# Patient Record
Sex: Female | Born: 1945
Health system: Southern US, Community
[De-identification: ages and names within clinical notes are randomized; demographics above are authoritative.]

## PROBLEM LIST (undated history)

## (undated) DIAGNOSIS — C801 Malignant (primary) neoplasm, unspecified: Secondary | ICD-10-CM

## (undated) DIAGNOSIS — E785 Hyperlipidemia, unspecified: Secondary | ICD-10-CM

## (undated) DIAGNOSIS — I10 Essential (primary) hypertension: Secondary | ICD-10-CM

## (undated) HISTORY — DX: Hyperlipidemia, unspecified: E78.5

## (undated) HISTORY — DX: Essential (primary) hypertension: I10

## (undated) HISTORY — DX: Malignant (primary) neoplasm, unspecified: C80.1

---

## 1979-08-18 DIAGNOSIS — C801 Malignant (primary) neoplasm, unspecified: Secondary | ICD-10-CM

## 1979-08-18 HISTORY — PX: TOTAL ABDOMINAL HYSTERECTOMY: SHX209

## 1979-08-18 HISTORY — DX: Malignant (primary) neoplasm, unspecified: C80.1

## 2002-08-17 HISTORY — PX: LAPAROSCOPIC CHOLECYSTECTOMY: SUR755

## 2010-10-24 ENCOUNTER — Other Ambulatory Visit: Payer: Self-pay | Admitting: Family Medicine

## 2010-10-24 ENCOUNTER — Encounter: Payer: Self-pay | Admitting: Family Medicine

## 2010-10-24 ENCOUNTER — Ambulatory Visit (INDEPENDENT_AMBULATORY_CARE_PROVIDER_SITE_OTHER): Payer: Medicare PPO | Admitting: Family Medicine

## 2010-10-24 DIAGNOSIS — E785 Hyperlipidemia, unspecified: Secondary | ICD-10-CM | POA: Insufficient documentation

## 2010-10-24 DIAGNOSIS — I10 Essential (primary) hypertension: Secondary | ICD-10-CM

## 2010-10-24 DIAGNOSIS — R609 Edema, unspecified: Secondary | ICD-10-CM

## 2010-10-24 DIAGNOSIS — Z780 Asymptomatic menopausal state: Secondary | ICD-10-CM | POA: Insufficient documentation

## 2010-10-24 DIAGNOSIS — E559 Vitamin D deficiency, unspecified: Secondary | ICD-10-CM

## 2010-10-24 LAB — COMPREHENSIVE METABOLIC PANEL
ALT: 19 U/L (ref 0–35)
Alkaline Phosphatase: 63 U/L (ref 39–117)
CO2: 25 mEq/L (ref 19–32)
Creat: 0.53 mg/dL (ref 0.40–1.20)
Total Bilirubin: 0.5 mg/dL (ref 0.3–1.2)

## 2010-10-24 LAB — LIPID PANEL
Cholesterol: 287 mg/dL — ABNORMAL HIGH (ref 0–200)
Total CHOL/HDL Ratio: 5 Ratio
Triglycerides: 82 mg/dL (ref <150)
VLDL: 16 mg/dL (ref 0–40)

## 2010-10-24 LAB — VITAMIN D 25 HYDROXY (VIT D DEFICIENCY, FRACTURES): Vit D, 25-Hydroxy: 28 ng/mL — ABNORMAL LOW (ref 30–89)

## 2010-10-27 LAB — CONVERTED CEMR LAB
Albumin: 4.6 g/dL (ref 3.5–5.2)
CO2: 25 meq/L (ref 19–32)
Cholesterol: 287 mg/dL — ABNORMAL HIGH (ref 0–200)
Glucose, Bld: 89 mg/dL (ref 70–99)
HCT: 41.1 % (ref 36.0–46.0)
MCV: 92.2 fL (ref 78.0–100.0)
Platelets: 249 10*3/uL (ref 150–400)
Potassium: 4.5 meq/L (ref 3.5–5.3)
RDW: 12.7 % (ref 11.5–15.5)
Sodium: 140 meq/L (ref 135–145)
Total Protein: 7.1 g/dL (ref 6.0–8.3)
Triglycerides: 82 mg/dL (ref <150)

## 2010-10-28 NOTE — Assessment & Plan Note (Signed)
Summary: NOV BP/ chol/ labs   Vital Signs:  Patient profile:   65 year old female Height:      64 inches Weight:      154 pounds BMI:     26.53 O2 Sat:      97 % on Room air Temp:     98.1 degrees F oral Pulse rate:   63 / minute BP sitting:   154 / 79  (left arm) Cuff size:   regular  Vitals Entered By: Payton Spark CMA (October 24, 2010 9:26 AM)  O2 Flow:  Room air CC: New to est.    Primary Care Provider:  Seymour Bars DO  CC:  New to est. .  History of Present Illness: 65 yo WF presents for NOV.  She has a hx of HTN and high cholesterol.  Currently on HCTZ alone.  she is due for fasting labs.  She was on Lipitor in the past and did well on it though had some myalgias.  She had a TAH with oophorectomy for cancer in 1980-01-11.  She was on Premarin for about 20 yrs.  She is due for a mammogram, DEXA, pap and colonoscopy.  Denies chest pain, palpitations but has some mild edema.    Current Medications (verified): 1)  Hydrochlorothiazide 12.5 Mg Caps (Hydrochlorothiazide) .... Take 1 Tab By Mouth Once Daily 2)  Vitamin B-12 500 Mcg Tabs (Cyanocobalamin) 3)  Vitamin D3 400 Unit Tabs (Cholecalciferol) 4)  Fish Oil 1000 Mg Caps (Omega-3 Fatty Acids) 5)  Calcium 500 Mg Tabs (Calcium)  Allergies (verified): 1)  ! Codeine  Past History:  Past Medical History: HTN high cholesterol G2P2 hx of ? endometrial or cervical cancer 1981  Past Surgical History: TAH with BSO 1981 for cancerous reasons lap chole 01/11/03  Family History: mother died CHF  father died lung cancer sister died liver cancer 1 brother died - drowning 1 brother died of lung cancer sister alive, high cholesterol  Social History: Works part time at [employer].  Finished HS. Married to [place]. Has a son and daughter, local. Never smoked. Denies ETOH. Walks 45 min every other day.  Review of Systems       no fevers/sweats/weakness, unexplained wt loss/gain, no change in vision, no difficulty  hearing, ringing in ears, no hay fever/allergies, no CP/discomfort, no palpitations, no breast lump/nipple discharge, no cough/wheeze, no blood in stool, no N/V/D, + nocturia, no leaking urine, no unusual vag bleeding, no vaginal/penile discharge, no muscle/joint pain, no rash, no new/changing mole, no HA, no memory loss, no anxiety, no sleep problem, no depression, no unexplained lumps, no easy bruising/bleeding, no concern with sexual function   Physical Exam  General:  alert, well-developed, well-nourished, and well-hydrated.   Head:  normocephalic and atraumatic.   Eyes:  wears glasses Mouth:  pharynx pink and moist.   Neck:  no masses.   Lungs:  Normal respiratory effort, chest expands symmetrically. Lungs are clear to auscultation, no crackles or wheezes. Heart:  RRR with 1/6 systolic murmur.  Pulses:  2+ radial pulses Extremities:  trace LE edema Skin:  color normal.   Cervical Nodes:  No lymphadenopathy noted Psych:  good eye contact, not anxious appearing, and not depressed appearing.     Impression & Recommendations:  Problem # 1:  ESSENTIAL HYPERTENSION, BENIGN (ICD-401.1) BP high today, not at goal on HCTZ alone.  Will add Benazepril once daily and recheck BP/ BMP in 4 wks during visit for PAP smear.  Call if any problems. Her updated medication list for this problem includes:    Hydrochlorothiazide 12.5 Mg Caps (Hydrochlorothiazide) .Marland Kitchen... Take 1 tab by mouth once daily    Benazepril Hcl 20 Mg Tabs (Benazepril hcl) .Marland Kitchen... 1 tab by mouth once daily  Orders: T-CBC w/Diff ([id number]) T-Comprehensive Metabolic Panel ([id number])  BP today: 154/79  Problem # 2:  HYPERLIPIDEMIA (ICD-272.4) Off meds.  Had some myalgias on Lipitor.  Will update labs today. Orders: T-Lipid Profile ([id number])  Problem # 3:  EDEMA (ICD-782.3) Continue HCTZ.  Check CMP and TSH with labs and f/u results Monday. Plan to get a 2D echo this year. Her updated medication list for this problem  includes:    Hydrochlorothiazide 12.5 Mg Caps (Hydrochlorothiazide) .Marland Kitchen... Take 1 tab by mouth once daily  Orders: T-TSH ([id number])  Problem # 4:  UNSPECIFIED VITAMIN D DEFICIENCY (ICD-268.9) Update DEXA scan and check Vit D level with labs.  On OTC Vit D currently.   Orders: T-Vitamin D (25-Hydroxy) [phone])  Complete Medication List: 1)  Hydrochlorothiazide 12.5 Mg Caps (Hydrochlorothiazide) .... Take 1 tab by mouth once daily 2)  Vitamin B-12 500 Mcg Tabs (Cyanocobalamin) 3)  Vitamin D3 400 Unit Tabs (Cholecalciferol) 4)  Fish Oil 1000 Mg Caps (Omega-3 fatty acids) 5)  Calcium 500 Mg Tabs (Calcium) 6)  Benazepril Hcl 20 Mg Tabs (Benazepril hcl) .Marland Kitchen.. 1 tab by mouth once daily  Other Orders: T-Mammography Bilateral Screening (02725) T-DXA Bone Density/ Appendicular (36644) T-Dual DXA Bone Density/ Axial (03474)  Patient Instructions: 1)  BP HIGH -- add Benazepril 20 mg once daily to HCTZ for high BP. 2)  Check fasting labs downstairs today and will call you w/ results tomorrow. 3)  Update mammogram and DEXA downstairs.  Call for appt. 4)  Call if any problems. 5)  Set up PAP smear/ repeat BMP/ BP in 4 wks. Prescriptions: BENAZEPRIL HCL 20 MG TABS (BENAZEPRIL HCL) 1 tab by mouth once daily  #30 x 1   Entered and Authorized by:   Seymour Bars DO   Signed by:   Seymour Bars DO on 10/24/2010   Method used:   Electronically to        [facility] [phone]* (retail)       [address] [place], [place]  [address]       Ph: [phone]       Fax: [phone]   RxID:   [id number] HYDROCHLOROTHIAZIDE 12.5 MG CAPS (HYDROCHLOROTHIAZIDE) Take 1 tab by mouth once daily  #30 x 5   Entered and Authorized by:   Seymour Bars DO   Signed by:   Seymour Bars DO on 10/24/2010   Method used:   Electronically to        [facility] [phone]* (retail)       [address] [place], [place]  [address]       Ph: [phone]       Fax: [phone]   RxID:    [id number]    Orders Added: 1)  T-Mammography Bilateral Screening [77057] 2)  T-DXA Bone Density/ Appendicular [77081] 3)  T-Dual DXA Bone Density/ Axial [77080] 4)  T-CBC w/Diff [[id number]] 5)  T-Comprehensive Metabolic Panel [[id number]] 6)  T-Lipid Profile [80061-22930] 7)  T-Vitamin D (25-Hydroxy) [78938-10175] 8)  T-TSH [10258-52778] 9)  New Patient Level III [24235]

## 2010-11-04 NOTE — Letter (Signed)
Summary: Intake Forms  Intake Forms   Imported By: Lanelle Bal 10/30/2010 10:51:32  _____________________________________________________________________  External Attachment:    Type:   Image     Comment:   External Document

## 2010-11-17 ENCOUNTER — Encounter: Payer: Self-pay | Admitting: Family Medicine

## 2010-11-17 ENCOUNTER — Other Ambulatory Visit: Payer: Self-pay | Admitting: Family Medicine

## 2010-11-17 DIAGNOSIS — Z1231 Encounter for screening mammogram for malignant neoplasm of breast: Secondary | ICD-10-CM

## 2010-11-17 DIAGNOSIS — Z780 Asymptomatic menopausal state: Secondary | ICD-10-CM

## 2010-11-19 ENCOUNTER — Other Ambulatory Visit: Payer: Self-pay | Admitting: Family Medicine

## 2010-11-19 DIAGNOSIS — Z780 Asymptomatic menopausal state: Secondary | ICD-10-CM

## 2010-11-20 ENCOUNTER — Ambulatory Visit: Payer: Medicare PPO | Admitting: Family Medicine

## 2010-11-28 ENCOUNTER — Encounter: Payer: Self-pay | Admitting: Family Medicine

## 2010-12-04 ENCOUNTER — Other Ambulatory Visit (HOSPITAL_COMMUNITY)
Admission: RE | Admit: 2010-12-04 | Discharge: 2010-12-04 | Disposition: A | Discharge status: Home / Self Care | Payer: Medicare PPO | Source: Ambulatory Visit | Attending: Family Medicine | Admitting: Family Medicine

## 2010-12-04 ENCOUNTER — Ambulatory Visit (INDEPENDENT_AMBULATORY_CARE_PROVIDER_SITE_OTHER): Payer: Medicare PPO | Admitting: Family Medicine

## 2010-12-04 ENCOUNTER — Encounter: Payer: Self-pay | Admitting: Family Medicine

## 2010-12-04 VITALS — BP 161/85 | HR 73 | Ht 64.0 in | Wt 159.0 lb

## 2010-12-04 DIAGNOSIS — Z01419 Encounter for gynecological examination (general) (routine) without abnormal findings: Secondary | ICD-10-CM | POA: Insufficient documentation

## 2010-12-04 DIAGNOSIS — E559 Vitamin D deficiency, unspecified: Secondary | ICD-10-CM

## 2010-12-04 DIAGNOSIS — E785 Hyperlipidemia, unspecified: Secondary | ICD-10-CM

## 2010-12-04 DIAGNOSIS — I10 Essential (primary) hypertension: Secondary | ICD-10-CM

## 2010-12-04 NOTE — Assessment & Plan Note (Signed)
Doing great on crestor 20  Mg qhs.  Continue and recheck labs in 3 mos.

## 2010-12-04 NOTE — Assessment & Plan Note (Signed)
Continue RX wkly vitamin D in addition to OTC Vit D 1,000 IU/ day.  Recheck level in 3 mos.

## 2010-12-04 NOTE — Assessment & Plan Note (Signed)
Thin prep pap of the vaginal cuff done given her hx of gyn cancer as cause for hysterectomy.  F/u results next wk.

## 2010-12-04 NOTE — Progress Notes (Signed)
  Subjective:    Patient ID: Michelle Lawrence, female    DOB: [birth year], 65 y.o.   MRN: [id number]  HPI  65 yo WF presents for thin prep smear.  She has not had a pap since 2002 and her hysterectomy was done for cancerous reasons.  Her ovaries were also removed.  She denies vag bleeding or discharge but has some itching and irritation.  She admits to not taking her benazepril for her BP but is taking her HCTZ daily and has added Crestor 20 mg qhs, started a month ago for an LDL of 214.  She is also on RX Vit D wkly.    Her dexa/ mammo is scheduled for next wk.   BP 161/85  Pulse 73  Ht 5\' 4"  (1.626 m)  Wt 159 lb (72.122 kg)  BMI 27.29 kg/m2  SpO2 96%    Review of Systems  Constitutional: Negative for fever and fatigue.  Respiratory: Negative for shortness of breath.   Cardiovascular: Negative for chest pain.  Genitourinary: Negative for dysuria, urgency, frequency, vaginal bleeding, vaginal discharge, genital sores, vaginal pain, pelvic pain and dyspareunia.  Psychiatric/Behavioral: Negative for dysphoric mood. The patient is not nervous/anxious.        Objective:   Physical Exam  Constitutional: She appears well-developed and well-nourished. No distress.  HENT:  Head: Normocephalic and atraumatic.  Neck: No thyromegaly present.  Cardiovascular: Normal rate and normal heart sounds.   No murmur heard. Pulmonary/Chest: Effort normal and breath sounds normal.  Abdominal: Soft. Bowel sounds are normal. She exhibits no distension. There is no tenderness.  Genitourinary: There is rash on the right labia. There is no tenderness or lesion on the right labia. There is rash on the left labia. There is no tenderness or lesion on the left labia. Cervix exhibits no motion tenderness (vaginal cuff swabbed, appears normal) and no discharge. There is erythema (mild labial erythema) around the vagina.  Musculoskeletal: She exhibits no edema.  Lymphadenopathy:    She has no cervical adenopathy.    Skin: Skin is warm and dry.  Psychiatric: She has a normal mood and affect.          Assessment & Plan:

## 2010-12-04 NOTE — Assessment & Plan Note (Signed)
BP remains high b/c she is still not taking the benazepril.  She is to take it everyday in addition to her other meds and understands this.  rehceck in 2-3 mos.

## 2010-12-04 NOTE — Patient Instructions (Signed)
Will call you with pap smear report next wk.  Start Benazepril in the evenings and stay on HCTZ in the mornign.  Stay on Crestor at night.  Stay on 2 calcium OTCs + 2 vitamin D tabs daily and stay on RX vitamin D weekly.  Return for f/u visit in 3 mos.

## 2010-12-09 ENCOUNTER — Inpatient Hospital Stay: Admission: RE | Admit: 2010-12-09 | Payer: Medicare PPO | Source: Ambulatory Visit

## 2010-12-09 ENCOUNTER — Telehealth: Payer: Self-pay | Admitting: Family Medicine

## 2010-12-09 ENCOUNTER — Ambulatory Visit: Payer: Medicare PPO

## 2010-12-09 NOTE — Telephone Encounter (Signed)
Pls let pt know that her pap smear came back normal. 

## 2010-12-10 NOTE — Telephone Encounter (Signed)
Pt aware of the above  

## 2010-12-16 ENCOUNTER — Ambulatory Visit
Admission: RE | Admit: 2010-12-16 | Discharge: 2010-12-16 | Disposition: A | Discharge status: Home / Self Care | Payer: Medicare PPO | Source: Ambulatory Visit | Attending: Family Medicine | Admitting: Family Medicine

## 2010-12-16 ENCOUNTER — Other Ambulatory Visit: Payer: Medicare PPO

## 2010-12-16 DIAGNOSIS — Z1231 Encounter for screening mammogram for malignant neoplasm of breast: Secondary | ICD-10-CM

## 2010-12-16 DIAGNOSIS — Z780 Asymptomatic menopausal state: Secondary | ICD-10-CM

## 2010-12-17 ENCOUNTER — Telehealth: Payer: Self-pay | Admitting: Family Medicine

## 2010-12-17 DIAGNOSIS — M8580 Other specified disorders of bone density and structure, unspecified site: Secondary | ICD-10-CM | POA: Insufficient documentation

## 2010-12-17 NOTE — Telephone Encounter (Signed)
Pls let pt know that her bone density shows osteopenia.  (between nomral and osteoporosis).  Continue calcium + vitamin D daily and repeat in 2 yrs.

## 2010-12-17 NOTE — Telephone Encounter (Signed)
Pt aware of the above  

## 2011-01-19 ENCOUNTER — Other Ambulatory Visit: Payer: Self-pay | Admitting: *Deleted

## 2011-01-19 MED ORDER — ERGOCALCIFEROL 1.25 MG (50000 UT) PO CAPS: 50000.0000 [IU] | ORAL_CAPSULE | ORAL | Status: DC

## 2011-02-03 ENCOUNTER — Other Ambulatory Visit: Payer: Self-pay | Admitting: *Deleted

## 2011-02-03 MED ORDER — BENAZEPRIL HCL 20 MG PO TABS: 20.0000 mg | ORAL_TABLET | Freq: Every day | ORAL | Status: DC

## 2011-02-03 MED ORDER — ROSUVASTATIN CALCIUM 20 MG PO TABS: 20.0000 mg | ORAL_TABLET | Freq: Every day | ORAL | Status: DC

## 2011-03-05 ENCOUNTER — Encounter: Payer: Self-pay | Admitting: Family Medicine

## 2011-03-05 ENCOUNTER — Ambulatory Visit (INDEPENDENT_AMBULATORY_CARE_PROVIDER_SITE_OTHER): Payer: Medicare PPO | Admitting: Family Medicine

## 2011-03-05 DIAGNOSIS — E785 Hyperlipidemia, unspecified: Secondary | ICD-10-CM

## 2011-03-05 DIAGNOSIS — E559 Vitamin D deficiency, unspecified: Secondary | ICD-10-CM

## 2011-03-05 DIAGNOSIS — I10 Essential (primary) hypertension: Secondary | ICD-10-CM

## 2011-03-05 NOTE — Patient Instructions (Signed)
BP much improved!  Stay on current meds. Will call you w/ lab results tomorrow (will let you know if we need to continue RX Vitamin D).  Try to get 2-3 servings of calcium in diet daily (milk, calcium fortified OJ, almonds, broccoli, yogurt) and take 1 calcium supplement daily with dinner.  Return for f/u in 4 mos.

## 2011-03-05 NOTE — Assessment & Plan Note (Signed)
Due to recheck FLP, on Crestor 20 mg qhs, tolerating it well.

## 2011-03-05 NOTE — Assessment & Plan Note (Signed)
BP much improved by adding Benazepril.  Continue both and update a BMP today.  Continue to work on diet/ exercise.

## 2011-03-05 NOTE — Assessment & Plan Note (Signed)
Complicated by low Vit D.  Recheck level today.  Advised 2-3 servings of calcium in diet daily + 1 supplement daily with dinner.  Should stay on OTC Vit D 1,000 IU/ day.

## 2011-03-05 NOTE — Progress Notes (Signed)
  Subjective:    Patient ID: Michelle Lawrence, female    DOB: Jun 09, 1946, 65 y.o.   MRN: [id number]  HPI  65 yo WF presents for f/u visit.  She is back for f/u HTN, doing well on meds.  Recently had her DEXA that was + for osteopenia.  She started a calcium supplement 1-2 x a day and has just finished RX Vit D wkly and is due to have level rechecked.  She has very high cholesterol, on Crestor.  Denies chest pain or DOE.    BP 137/78  Pulse 61  Ht 5\' 4"  (1.626 m)  Wt 157 lb (71.215 kg)  BMI 26.95 kg/m2    Review of Systems  Constitutional: Negative for fatigue and unexpected weight change.  Eyes: Negative for visual disturbance.  Respiratory: Negative for shortness of breath.   Cardiovascular: Negative for chest pain, palpitations and leg swelling.  Genitourinary: Negative for difficulty urinating.  Neurological: Negative for headaches.       Objective:   Physical Exam  Constitutional: She appears well-developed and well-nourished. No distress.  HENT:  Mouth/Throat: Oropharynx is clear and moist.  Eyes: Pupils are equal, round, and reactive to light.  Neck: Neck supple. No thyromegaly present.  Cardiovascular: Normal rate, regular rhythm and normal heart sounds.   No murmur heard. Pulmonary/Chest: Effort normal and breath sounds normal.  Musculoskeletal: She exhibits no edema.  Lymphadenopathy:    She has no cervical adenopathy.  Skin:       Several SKs along the L abdomen.  Psychiatric: She has a normal mood and affect.          Assessment & Plan:

## 2011-03-06 ENCOUNTER — Telehealth: Payer: Self-pay | Admitting: Family Medicine

## 2011-03-06 LAB — BASIC METABOLIC PANEL WITH GFR
BUN: 18 mg/dL (ref 6–23)
CO2: 25 mEq/L (ref 19–32)
Chloride: 103 mEq/L (ref 96–112)
Creat: 0.48 mg/dL — ABNORMAL LOW (ref 0.50–1.10)
Glucose, Bld: 80 mg/dL (ref 70–99)

## 2011-03-06 LAB — LIPID PANEL
Total CHOL/HDL Ratio: 2.6 Ratio
VLDL: 15 mg/dL (ref 0–40)

## 2011-03-06 LAB — VITAMIN D 25 HYDROXY (VIT D DEFICIENCY, FRACTURES): Vit D, 25-Hydroxy: 50 ng/mL (ref 30–89)

## 2011-03-06 NOTE — Telephone Encounter (Signed)
Pls let pt know that her fasting sugar, liver and kidney function came back normal.  Her cholesterol is MUCH improved on meds -- continue.  Her Vitamin D is back to normal.  Stay on OTC Vitamin D 1,000 IU/ day for maintenance.

## 2011-03-06 NOTE — Telephone Encounter (Signed)
Pt aware of the above  

## 2011-03-12 ENCOUNTER — Telehealth: Payer: Self-pay | Admitting: Family Medicine

## 2011-03-12 NOTE — Telephone Encounter (Signed)
Pt called and said she bought Vit D 3 and was told by the provider to take Vit D 1000 IU daily.  Pt wants to know if Vit D 3 and Vit D are the same thing and if she can take what she has already purchased. Plan:  Pt instructed that Vit D 3 and Vit D are essentially the same thing and it is okay to take either or. Jarvis Newcomer, LPN [facility]

## 2011-04-28 ENCOUNTER — Telehealth: Payer: Self-pay | Admitting: Family Medicine

## 2011-04-28 NOTE — Telephone Encounter (Signed)
Pt called and said she has an appt scheduled with the provider for later in November and would it be okay to wait for her flu shot til then. Plan:  Pt informed okay to wait, but could choose to call and make a nurse visit to get flu shot sooner if so desires. Jarvis Newcomer, LPN [facility]

## 2011-05-08 ENCOUNTER — Ambulatory Visit (INDEPENDENT_AMBULATORY_CARE_PROVIDER_SITE_OTHER): Payer: Medicare PPO | Admitting: Family Medicine

## 2011-05-08 DIAGNOSIS — Z23 Encounter for immunization: Secondary | ICD-10-CM

## 2011-05-08 NOTE — Progress Notes (Signed)
Here for flu vaccine

## 2011-05-22 ENCOUNTER — Other Ambulatory Visit: Payer: Self-pay | Admitting: Family Medicine

## 2011-05-22 MED ORDER — HYDROCHLOROTHIAZIDE 12.5 MG PO CAPS: 12.5000 mg | ORAL_CAPSULE | Freq: Every day | ORAL | Status: DC

## 2011-05-22 NOTE — Telephone Encounter (Signed)
Pt called for refill of her HCTZ 12.5 mg. Plan:  Refilled med for #30/4 refills.  Pt informed and told to call the pharm in the future for routine refills. Jarvis Newcomer, LPN [facility]

## 2011-06-04 ENCOUNTER — Other Ambulatory Visit: Payer: Self-pay | Admitting: *Deleted

## 2011-06-04 MED ORDER — BENAZEPRIL HCL 20 MG PO TABS: 20.0000 mg | ORAL_TABLET | Freq: Every day | ORAL | Status: DC

## 2011-07-13 ENCOUNTER — Encounter: Payer: Self-pay | Admitting: Family Medicine

## 2011-07-13 ENCOUNTER — Ambulatory Visit (INDEPENDENT_AMBULATORY_CARE_PROVIDER_SITE_OTHER): Payer: Medicare PPO | Admitting: Family Medicine

## 2011-07-13 DIAGNOSIS — I10 Essential (primary) hypertension: Secondary | ICD-10-CM

## 2011-07-13 DIAGNOSIS — E785 Hyperlipidemia, unspecified: Secondary | ICD-10-CM

## 2011-07-13 MED ORDER — BENAZEPRIL-HYDROCHLOROTHIAZIDE 20-25 MG PO TABS: 1.0000 | ORAL_TABLET | Freq: Every day | ORAL | Status: DC

## 2011-07-13 MED ORDER — AMBULATORY NON FORMULARY MEDICATION: Status: DC

## 2011-07-13 MED ORDER — ROSUVASTATIN CALCIUM 20 MG PO TABS: 20.0000 mg | ORAL_TABLET | Freq: Every day | ORAL | Status: DC

## 2011-07-13 NOTE — Progress Notes (Signed)
  Subjective:    Patient ID: Michelle Lawrence, female    DOB: Mar 13, 1946, 65 y.o.   MRN: [id number]  Hypertension This is a chronic problem. The current episode started more than 1 year ago. The problem has been gradually improving since onset. Pertinent negatives include no chest pain or shortness of breath. Agents associated with hypertension include NSAIDs. Past treatments include diuretics and ACE inhibitors. The current treatment provides moderate improvement. There are no compliance problems.   Hyperlipidemia This is a chronic problem. The problem is controlled. Factors aggravating her hyperlipidemia include no known factors. Pertinent negatives include no chest pain or shortness of breath. Current antihyperlipidemic treatment includes statins. The current treatment provides mild improvement of lipids. There are no compliance problems.       Review of Systems  Respiratory: Negative for shortness of breath.   Cardiovascular: Negative for chest pain.       Objective:   Physical Exam  Constitutional: She is oriented to person, place, and time. She appears well-developed and well-nourished.  HENT:  Head: Normocephalic and atraumatic.  Cardiovascular: Normal rate, regular rhythm and normal heart sounds.   Pulmonary/Chest: Effort normal and breath sounds normal.  Neurological: She is alert and oriented to person, place, and time.  Skin: Skin is warm and dry.  Psychiatric: She has a normal mood and affect. Her behavior is normal.          Assessment & Plan:  HTN- well controlled. Will combine her medication. She is actually taking benaz/HCT 20/12.5 and a separate hct 12.5 .  F.U in 4 months. Check CMP to check Cr and BUN and K+.    Hyperlipidemia - Restart chol med. Has been off for 1 week and recheck later this month. Make sure eating healthy and getting regular exercise.   Dec pneumo vac today. Wants to wait until next year. Given rx for shingles vac.

## 2011-07-13 NOTE — Patient Instructions (Addendum)
Caltrate - D is one tablet twice a day. Then you can take one extra Vitamin D 400IU for your daily needs.  Once you finish your current BP meds can pick up new prescription for the benazepril HCT 20/25mg  once a day.

## 2011-07-15 ENCOUNTER — Telehealth: Payer: Self-pay | Admitting: *Deleted

## 2011-07-15 NOTE — Telephone Encounter (Signed)
Pt notified and will check her schedule and let us know when she can have this done

## 2011-07-15 NOTE — Telephone Encounter (Signed)
Dr. Linford Arnold- this pt had a mammogram on 12/16/2010 and was normal. I do not see a reocrd of old colonoscopy report in old or new system or any record of Tdap

## 2011-07-15 NOTE — Telephone Encounter (Signed)
Okay, please call the patient and let her know that we do not have a record of her colonoscopy or her tetanus vaccine. If she would like [place] to schedule these for her we certainly can. If she she's had been them done somewhere else please let us know and we can  get those records.

## 2011-07-27 LAB — LIPID PANEL
LDL Cholesterol: 68 mg/dL (ref 0–99)
Total CHOL/HDL Ratio: 3 Ratio
VLDL: 21 mg/dL (ref 0–40)

## 2011-07-28 LAB — COMPLETE METABOLIC PANEL WITH GFR
ALT: 21 U/L (ref 0–35)
AST: 18 U/L (ref 0–37)
Albumin: 4.1 g/dL (ref 3.5–5.2)
Alkaline Phosphatase: 67 U/L (ref 39–117)
Potassium: 4.8 mEq/L (ref 3.5–5.3)
Sodium: 142 mEq/L (ref 135–145)
Total Protein: 6.6 g/dL (ref 6.0–8.3)

## 2011-07-31 ENCOUNTER — Other Ambulatory Visit: Payer: Self-pay | Admitting: *Deleted

## 2011-07-31 MED ORDER — ROSUVASTATIN CALCIUM 20 MG PO TABS: 20.0000 mg | ORAL_TABLET | Freq: Every day | ORAL | Status: DC

## 2011-07-31 MED ORDER — BENAZEPRIL-HYDROCHLOROTHIAZIDE 20-25 MG PO TABS: 1.0000 | ORAL_TABLET | Freq: Every day | ORAL | Status: DC

## 2011-11-05 ENCOUNTER — Encounter: Payer: Self-pay | Admitting: *Deleted

## 2011-11-12 ENCOUNTER — Ambulatory Visit: Payer: Medicare PPO | Admitting: Family Medicine

## 2011-11-26 ENCOUNTER — Ambulatory Visit: Payer: Medicare PPO | Admitting: Family Medicine

## 2011-12-03 ENCOUNTER — Ambulatory Visit: Payer: Medicare PPO | Admitting: Family Medicine

## 2011-12-10 ENCOUNTER — Ambulatory Visit (INDEPENDENT_AMBULATORY_CARE_PROVIDER_SITE_OTHER): Payer: Medicare PPO | Admitting: Family Medicine

## 2011-12-10 ENCOUNTER — Encounter: Payer: Self-pay | Admitting: Family Medicine

## 2011-12-10 VITALS — BP 125/64 | HR 60 | Ht 64.0 in | Wt 156.0 lb

## 2011-12-10 DIAGNOSIS — L600 Ingrowing nail: Secondary | ICD-10-CM

## 2011-12-10 MED ORDER — HYDROCODONE-ACETAMINOPHEN 5-325 MG PO TABS: 1.0000 | ORAL_TABLET | Freq: Four times a day (QID) | ORAL | Status: AC | PRN

## 2011-12-10 NOTE — Patient Instructions (Signed)
Keep foot elevated today. He can remove the bandage tomorrow afternoon or evening. Can take a shower with regular soap and water. Do not apply any type of peroxide or peroxide-type product. Apply Vaseline and cover for one to 2 weeks. If you have any sign of infection and please call us back.

## 2011-12-10 NOTE — Progress Notes (Signed)
  Subjective:    Patient ID: Michelle Lawrence, female    DOB: 1945-12-18, 66 y.o.   MRN: [id number]  HPI She is here today for ingrown toenail on her left great toe. She says it's been swollen and tender flat weeks. She denies any pus or drainage from the toe. No systemic fever or aches. She says is getting so sore that she can't wear her regular shoes now. She has never had to have a toenail removed for ingrown toenail but has had it done for a nail fungus. She says she does get lightheaded sometimes and has passed out before procedures.   Review of Systems     Objective:   Physical Exam  Her left great toe along the medial edge curls in. There is a large pustule along the edge of the nail. Erythema over half of the distal toe.      Assessment & Plan:  Ingrown toenail-discussed the need for partial nail removal. Please see procedure note below. Patient tolerated well. She is to elevate her foot today and keep it wrapped for 24 hours. After that she can get it wet in the shower with soap and water. No peroxide. She'll apply Vaseline and recover the toe for one to 2 weeks. Make sure wearing shoes that do not put extra pressure on the toe while at healing. If she has any signs of infection or spreading erythema or pain she can call the office. I did give her a small quantity of hydrocodone to use for pain over the next 24 hours. After that she can change to ibuprofen or Aleve.  Toenail Avulsion Procedure Note  Pre-operative Diagnosis: Left Ingrown Great toenail   Post-operative Diagnosis: Left Ingrown Great toenail  Indications: Pain and infection  Anesthesia: Lidocaine 1% without epinephrine without added sodium bicarbonate  Procedure Details  History of allergy to iodine: no  The risks (including bleeding and infection) and benefits of the  procedure and Verbal informed consent obtained.  After digital block anesthesia was obtained, a tourniquet was applied for hemostasis during the  procedure.  After prepping with Betadine, the offending edge of the nail was freed from the nailbed and perionychium, and then split with scissors and removed with  forceps.  All visible granulation tissue is debrided. Antibiotic and bulky dressing was applied. Yellow pus expressed.   Findings: Infected ingrown toenail.  Complications: none.  Plan: 1. Soak the foot twice daily. Change dressing twice daily until healed over. 2. Warning signs of infection were reviewed.   3. Recommended that the patient use Vicodin as needed for pain.  4. Return as needed.

## 2011-12-21 ENCOUNTER — Ambulatory Visit: Payer: Medicare PPO | Admitting: Family Medicine

## 2011-12-31 ENCOUNTER — Ambulatory Visit: Payer: Medicare PPO | Admitting: Family Medicine

## 2012-02-04 ENCOUNTER — Ambulatory Visit (INDEPENDENT_AMBULATORY_CARE_PROVIDER_SITE_OTHER): Payer: Medicare PPO | Admitting: Family Medicine

## 2012-02-04 ENCOUNTER — Encounter: Payer: Self-pay | Admitting: Family Medicine

## 2012-02-04 VITALS — BP 134/76 | HR 69 | Ht 64.0 in | Wt 157.0 lb

## 2012-02-04 DIAGNOSIS — N760 Acute vaginitis: Secondary | ICD-10-CM

## 2012-02-04 DIAGNOSIS — R0789 Other chest pain: Secondary | ICD-10-CM

## 2012-02-04 DIAGNOSIS — E785 Hyperlipidemia, unspecified: Secondary | ICD-10-CM

## 2012-02-04 DIAGNOSIS — Z9181 History of falling: Secondary | ICD-10-CM

## 2012-02-04 DIAGNOSIS — Z23 Encounter for immunization: Secondary | ICD-10-CM

## 2012-02-04 DIAGNOSIS — Z1331 Encounter for screening for depression: Secondary | ICD-10-CM

## 2012-02-04 DIAGNOSIS — L259 Unspecified contact dermatitis, unspecified cause: Secondary | ICD-10-CM

## 2012-02-04 DIAGNOSIS — I10 Essential (primary) hypertension: Secondary | ICD-10-CM

## 2012-02-04 LAB — COMPLETE METABOLIC PANEL WITH GFR
CO2: 28 mEq/L (ref 19–32)
Calcium: 10 mg/dL (ref 8.4–10.5)
Creat: 0.54 mg/dL (ref 0.50–1.10)
GFR, Est African American: >89 mL/min
GFR, Est Non African American: >89 mL/min
Glucose, Bld: 86 mg/dL (ref 70–99)
Total Bilirubin: 0.4 mg/dL (ref 0.3–1.2)

## 2012-02-04 LAB — CBC WITH DIFFERENTIAL/PLATELET
Basophils Relative: 1 % (ref 0–1)
Hemoglobin: 13.3 g/dL (ref 12.0–15.0)
Lymphs Abs: 1.5 10*3/uL (ref 0.7–4.0)
MCHC: 34 g/dL (ref 30.0–36.0)
Monocytes Relative: 11 % (ref 3–12)
Neutro Abs: 2.4 10*3/uL (ref 1.7–7.7)
Neutrophils Relative %: 52 % (ref 43–77)
Platelets: 268 10*3/uL (ref 150–400)
RBC: 4.35 MIL/uL (ref 3.87–5.11)

## 2012-02-04 LAB — TSH: TSH: 1.414 u[IU]/mL (ref 0.350–4.500)

## 2012-02-04 MED ORDER — AMBULATORY NON FORMULARY MEDICATION: Status: DC

## 2012-02-04 MED ORDER — TRIAMCINOLONE ACETONIDE 0.5 % EX OINT: TOPICAL_OINTMENT | Freq: Every day | CUTANEOUS | Status: DC | PRN

## 2012-02-04 NOTE — Addendum Note (Signed)
Addended by: Wyline Beady on: 02/04/2012 03:05 PM   Modules accepted: Orders

## 2012-02-04 NOTE — Progress Notes (Addendum)
  Subjective:    Patient ID: Michelle Lawrence, female    DOB: Jan 14, 1946, 66 y.o.   MRN: [id number]  HPI HTN - no CP or SOB.  Taking BP medication.  No SE.  ONe night had terrrible chest pain after eating a tomato sandwhcih and canteoopule  When went to bed. She had to sit up for awhile till felt better. Took a TUMS and helped some but not a great deal. No more chest pain since then. Does walk at the park of exercise.  Pain was midsternal. No radiation of pain. No diaphoresis. Some mild nausea with it. No vomiting.  Hyperlipidemia - Noticed legs aching at night, but not every night.  She also works partime and has been walking more.  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   Contact dermatitis - wears gloves at work that are latext free but says will sweat in her gloves and will sometimes get itchy bumps.    She's also had some vaginal itching for the last couple days and wants to she can be tested for yeast. No discharge or pelvic pain. No fever.  Review of Systems     Objective:   Physical Exam  Constitutional: She is oriented to person, place, and time. She appears well-developed and well-nourished.  HENT:  Head: Normocephalic and atraumatic.  Cardiovascular: Normal rate, regular rhythm and normal heart sounds.        No carotid or abdominal bruits.  Pulmonary/Chest: Effort normal and breath sounds normal.  Musculoskeletal: She exhibits no edema.  Neurological: She is alert and oriented to person, place, and time.  Skin: Skin is warm and dry.  Psychiatric: She has a normal mood and affect. Her behavior is normal.          Assessment & Plan:  HTN - Well controlled. Check CMP today. F/U in 6 months  Atypical chest pain-based on her description suspect this was an episode of GERD but I did get an EKG today and she does have high cholesterol and high blood pressure. No prior history of her disease. He EKG shows rate of 56 beats per minute, bradycardia, sinus  rhythm. Normal axis. No acute changes or ST-T wave changes. No Q waves.  Hyperlipidemia - lipids were well controlled in November. Recheck liver today. Recheck lipids in November.  She is currrently taking half a tab of the Crestor. I encouraged her that may not be quite as effective at half the dose but will have some benefit as far as stroke and heart risk reduction. Samples given today. Followup in 6 months.  Contact dermatitis - Try to keep the hands try and change gloves frequently. I will send over a  a topical steroid cream to use. Don't use for more than 2 weeks.   Vaginitis-wet prep sent. We will call with results.  Depression Screen -  PHQ-9 score of 3, neg for depession.   Fall Assessment - Score of 3, low risk.

## 2012-02-10 ENCOUNTER — Encounter: Payer: Self-pay | Admitting: *Deleted

## 2012-02-12 ENCOUNTER — Telehealth: Payer: Self-pay | Admitting: *Deleted

## 2012-02-12 MED ORDER — PRAVASTATIN SODIUM 40 MG PO TABS: 40.0000 mg | ORAL_TABLET | Freq: Every day | ORAL | Status: DC

## 2012-02-12 NOTE — Telephone Encounter (Signed)
Pt aware.

## 2012-02-12 NOTE — Telephone Encounter (Signed)
Try pravastatin daily. Call with any side effects will check cholesterol in 3 months.

## 2012-02-12 NOTE — Telephone Encounter (Signed)
Pt states that she will not be taking the Crestor 20mg  b/c it makes her legs achy. She states she tried the Lipitor before but it didn't work. Would like something else to try that is also cheaper. Please advise.

## 2012-02-17 ENCOUNTER — Encounter: Payer: Self-pay | Admitting: *Deleted

## 2012-02-17 ENCOUNTER — Emergency Department
Admission: EM | Admit: 2012-02-17 | Discharge: 2012-02-17 | Disposition: A | Discharge status: Home / Self Care | Payer: Medicare PPO | Source: Home / Self Care | Attending: Emergency Medicine | Admitting: Emergency Medicine

## 2012-02-17 DIAGNOSIS — J069 Acute upper respiratory infection, unspecified: Secondary | ICD-10-CM

## 2012-02-17 DIAGNOSIS — R21 Rash and other nonspecific skin eruption: Secondary | ICD-10-CM

## 2012-02-17 MED ORDER — PREDNISONE 10 MG PO TABS: 10.0000 mg | ORAL_TABLET | Freq: Two times a day (BID) | ORAL | Status: DC

## 2012-02-17 NOTE — ED Notes (Signed)
Patient c/o runny nose and sneezing x yesterday. Last night she developed a rash on her face. She started a new cholesterol medication 1 week ago. She used a new face cream for 2 days, 3 days ago but has not used it since.

## 2012-02-17 NOTE — ED Provider Notes (Signed)
History     CSN: [id number]  Arrival date & time 02/17/12  0827   First MD Initiated Contact with Patient 02/17/12 0830      Chief Complaint  Patient presents with  . Rash  . Nasal Congestion    (Consider location/radiation/quality/duration/timing/severity/associated sxs/prior treatment) HPI Michelle Lawrence is a 66 y.o. female who complains of onset of cold symptoms for 2-3 days.  The symptoms are constant and mild-moderate in severity. No sore throat No cough No pleuritic pain No wheezing + nasal congestion + post-nasal drainage + sneezing No sinus pain/pressure No chest congestion No itchy/red eyes No earache No hemoptysis No SOB No chills/sweats No fever No nausea No vomiting No abdominal pain No diarrhea + skin rashes (she has a facial rash that is slightly itchy.  She recently started a new cholesterol medicine a few weeks ago.  She also tried a new facial cream about 3 days ago and broke out in a rash over the last day.  She has not been using any particular medicines to help it.) No fatigue No myalgias No headache    Past Medical History  Diagnosis Date  . Hypertension   . Hyperlipidemia   . Cancer 1981    enfometrial or cervical?    Past Surgical History  Procedure Date  . Total abdominal hysterectomy 1981    w/ BSO for cancerous reasons  . Laparoscopic cholecystectomy 2004    Family History  Problem Relation Age of Onset  . Other Mother     CHF  . Cancer Father     lung  . Cancer Sister     liver  . Cancer Brother     lung  . Hyperlipidemia Sister   . Hyperlipidemia Maternal Uncle     History  Substance Use Topics  . Smoking status: Never Smoker   . Smokeless tobacco: Not on file  . Alcohol Use: No    OB History    Grav Para Term Preterm Abortions TAB SAB Ect Mult Living                  Review of Systems  All other systems reviewed and are negative.    Allergies  Codeine and Latex  Home Medications   Current Outpatient Rx    Name Route Sig Dispense Refill  . AMBULATORY NON FORMULARY MEDICATION  Medication Name: Zostavax IM x 1. 1 vial 0  . AMBULATORY NON FORMULARY MEDICATION  Medication Name: zostavax IM x 1 1 vial 0  . BENAZEPRIL-HYDROCHLOROTHIAZIDE 20-25 MG PO TABS Oral Take 1 tablet by mouth daily. 30 tablet 6  . OMEGA-3 FATTY ACIDS 1000 MG PO CAPS Oral Take 2 g by mouth daily.      Marland Kitchen PRAVASTATIN SODIUM 40 MG PO TABS Oral Take 1 tablet (40 mg total) by mouth daily. 30 tablet 3  . PREDNISONE 10 MG PO TABS Oral Take 1 tablet (10 mg total) by mouth 2 (two) times daily. 8 tablet 0  . TRIAMCINOLONE ACETONIDE 0.5 % EX OINT Topical Apply topically daily as needed. don't use fore more than 2 consecutive weeks at a time. 30 g 0  . VITAMIN B-12 500 MCG PO TABS Oral Take 500 mcg by mouth daily.        BP 144/81  Pulse 73  Temp 98.4 F (36.9 C) (Oral)  Resp 14  Ht 5\' 4"  (1.626 m)  Wt 155 lb (70.308 kg)  BMI 26.61 kg/m2  SpO2 98%  Physical Exam  Nursing note and  vitals reviewed. Constitutional: She is oriented to person, place, and time. She appears well-developed and well-nourished.  HENT:  Head: Normocephalic and atraumatic.  Right Ear: Tympanic membrane, external ear and ear canal normal.  Left Ear: Tympanic membrane, external ear and ear canal normal.  Nose: Mucosal edema and rhinorrhea present.  Mouth/Throat: Posterior oropharyngeal erythema present. No oropharyngeal exudate or posterior oropharyngeal edema.  Eyes: No scleral icterus.  Neck: Neck supple.  Cardiovascular: Regular rhythm and normal heart sounds.   Pulmonary/Chest: Effort normal and breath sounds normal. No respiratory distress.  Neurological: She is alert and oriented to person, place, and time.  Skin: Skin is warm and dry.       She has an erythematous facial rash that looks slightly raised and irritated.  No signs of bacterial infection.  No other areas on her skin that are affected.  Psychiatric: She has a normal mood and affect. Her  speech is normal.    ED Course  Procedures (including critical care time)  Labs Reviewed - No data to display No results found.   1. Acute upper respiratory infections of unspecified site   2. Rash       MDM  1)  I think that her reaction secondary to her new facial cream.  I told her not to use it again.  The rest of her symptoms are likely secondary to a histamine release.  I gave her prednisone for a couple days and she is to use a over-the-counter cortisone cream as well daily for the next few days.  This should eliminate the rash and her other symptoms.  If not and she is to followup with her primary care physician or a dermatologist.  She may also use an oral antihistamine as well.  Marlaine Hind, MD 02/17/12 [phone]

## 2012-03-07 ENCOUNTER — Other Ambulatory Visit: Payer: Self-pay | Admitting: *Deleted

## 2012-03-07 MED ORDER — BENAZEPRIL-HYDROCHLOROTHIAZIDE 20-25 MG PO TABS: 1.0000 | ORAL_TABLET | Freq: Every day | ORAL | Status: DC

## 2012-04-22 ENCOUNTER — Ambulatory Visit (INDEPENDENT_AMBULATORY_CARE_PROVIDER_SITE_OTHER): Payer: Medicare PPO | Admitting: Physician Assistant

## 2012-04-22 ENCOUNTER — Encounter: Payer: Self-pay | Admitting: Physician Assistant

## 2012-04-22 VITALS — BP 126/72 | HR 71 | Ht 64.0 in | Wt 152.0 lb

## 2012-04-22 DIAGNOSIS — L739 Follicular disorder, unspecified: Secondary | ICD-10-CM

## 2012-04-22 DIAGNOSIS — Z79899 Other long term (current) drug therapy: Secondary | ICD-10-CM

## 2012-04-22 DIAGNOSIS — L738 Other specified follicular disorders: Secondary | ICD-10-CM

## 2012-04-22 DIAGNOSIS — E559 Vitamin D deficiency, unspecified: Secondary | ICD-10-CM

## 2012-04-22 MED ORDER — CEPHALEXIN 250 MG PO CAPS: 250.0000 mg | ORAL_CAPSULE | Freq: Four times a day (QID) | ORAL | Status: AC

## 2012-04-22 NOTE — Progress Notes (Signed)
  Subjective:    Patient ID: Genifer Lazenby, female    DOB: Oct 05, 1945, 66 y.o.   MRN: [id number]  HPI Patient is a 66 yo female presenting to clinic with bump on her buttocks and pubic area. She occasionally will have red bumps in her pubic area that come and go away on there on. 1 week ago she noticed bumps on her bump that looked like they had pus in them and bump in her pubic area that would not go away. Denies any pain, itching. She has not ran a fever, had chills, or muscle aches. She has not tried anything to make better except regualr baths. She does tend to sweat a lot in those areas.   She has also been taking B12 for years and not had checked recently. She wonders if she should even be on it. She did have B12 def a couple of years ago.    Review of Systems     Objective:   Physical Exam  Constitutional: She is oriented to person, place, and time. She appears well-developed and well-nourished.  HENT:  Head: Normocephalic and atraumatic.  Cardiovascular: Normal rate, regular rhythm and normal heart sounds.   Pulmonary/Chest: Effort normal and breath sounds normal.  Neurological: She is alert and oriented to person, place, and time.  Skin:       Pustules over bilateral buttocks with some surrounding erythema.  Solitary papule on the left labia with an erythematous base. Warm to the touch. No center opening or drainage site. No blood or pus.  Psychiatric: She has a normal mood and affect. Her behavior is normal.          Assessment & Plan:  Folliculitis- Encouraged patient to use benzoyl peroxide OTC for next couple of days to see if improves. Did give rx for Keflex to fill if not improving by MOnday. Gave H.O. For symptomatic care.   Medication management/unspecfied b12 def- Suspect that pt might have B12 deficiency. Want to check levels to make sure in normal range. Will call pt with results. Warned pt that too high B12 can cause same symptoms and be just has dangerous as  too low b12.

## 2012-04-22 NOTE — Patient Instructions (Addendum)
First try OTC Benzyol peroxide twice a day on affected areas. If not improving by Monday then can fill Keflex for 10 days.   Folliculitis  Folliculitis is an infection and inflammation of the hair follicles. Hair follicles become red and irritated. This inflammation is usually caused by bacteria. The bacteria thrive in warm, moist environments. This condition can be seen anywhere on the body.  CAUSES The most common cause of folliculitis is an infection by germs (bacteria). Fungal and viral infections can also cause the condition. Viral infections may be more common in people whose bodies are unable to fight disease well (weakened immune systems). Examples include people with:  AIDS.   An organ transplant.   Cancer.  People with depressed immune systems, diabetes, or obesity, have a greater risk of getting folliculitis than the general population. Certain chemicals, especially oils and tars, also can cause folliculitis. SYMPTOMS  An early sign of folliculitis is a small, white or yellow pus-filled, itchy lesion (pustule). These lesions appear on a red, inflamed follicle. They are usually less than 5 mm (.20 inches).   The most likely starting points are the scalp, thighs, legs, back and buttocks. Folliculitis is also frequently found in areas of repeated shaving.   When an infection of the follicle goes deeper, it becomes a boil or furuncle. A group of closely packed boils create a larger lesion (a carbuncle). These sores (lesions) tend to occur in hairy, sweaty areas of the body.  TREATMENT   A doctor who specializes in skin problems (dermatologists) treats mild cases of folliculitis with antiseptic washes.   They also use a skin application which kills germs (topical antibiotics). Tea tree oil is a good topical antiseptic as well. It can be found at a health food store. A small percentage of individuals may develop an allergy to the tea tree oil.   Mild to moderate boils respond well to  warm water compresses applied three times daily.   In some cases, oral antibiotics should be taken with the skin treatment.   If lesions contain large quantities of pus or fluid, your caregiver may drain them. This allows the topical antibiotics to get to the affected areas better.   Stubborn cases of folliculitis may respond to laser hair removal. This process uses a high intensity light beam (a laser) to destroy the follicle and reduces the scarring from folliculitis. After laser hair removal, hair will no longer grow in the laser treated area.  Patients with long-lasting folliculitis need to find out where the infection is coming from. Germs can live in the nostrils of the patient. This can trigger an outbreak now and then. Sometimes the bacteria live in the nostrils of a family member. This person does not develop the disorder but they repeatedly re-expose others to the germ. To break the cycle of recurrence in the patient, the family member must also undergo treatment. PREVENTION   Individuals who are predisposed to folliculitis should be extremely careful about personal hygiene.   Application of antiseptic washes may help prevent recurrences.   A topical antibiotic cream, mupirocin (Bactroban), has been effective at reducing bacteria in the nostrils. It is applied inside the nose with your little finger. This is done twice daily for a week. Then it is repeated every 6 months.   Because follicle disorders tend to come back, patients must receive follow-up care. Your caregiver may be able to recognize a recurrence before it becomes severe.  SEEK IMMEDIATE MEDICAL CARE IF:  You develop redness, swelling, or increasing pain in the area.   You have a fever.   You are not improving with treatment or are getting worse.   You have any other questions or concerns.  Document Released: 10/12/2001 Document Revised: 07/23/2011 Document Reviewed: 08/08/2008 [facility] Patient Information 2012  [place], [place].

## 2012-05-09 ENCOUNTER — Ambulatory Visit (INDEPENDENT_AMBULATORY_CARE_PROVIDER_SITE_OTHER): Payer: Medicare PPO | Admitting: Family Medicine

## 2012-05-09 ENCOUNTER — Other Ambulatory Visit: Payer: Self-pay | Admitting: Family Medicine

## 2012-05-09 DIAGNOSIS — Z1231 Encounter for screening mammogram for malignant neoplasm of breast: Secondary | ICD-10-CM

## 2012-05-09 DIAGNOSIS — Z298 Encounter for other specified prophylactic measures: Secondary | ICD-10-CM

## 2012-05-09 DIAGNOSIS — Z23 Encounter for immunization: Secondary | ICD-10-CM

## 2012-05-09 NOTE — Progress Notes (Signed)
  Subjective:    Patient ID: Michelle Lawrence, female    DOB: 10-Oct-1945, 66 y.o.   MRN: [id number]  HPI Here for flu and pneumonia vaccines.   Review of Systems     Objective:   Physical Exam        Assessment & Plan:

## 2012-05-17 ENCOUNTER — Ambulatory Visit (INDEPENDENT_AMBULATORY_CARE_PROVIDER_SITE_OTHER): Payer: Medicare PPO

## 2012-05-17 DIAGNOSIS — Z1231 Encounter for screening mammogram for malignant neoplasm of breast: Secondary | ICD-10-CM

## 2012-05-18 IMAGING — MG MM DIGITAL SCREENING BILAT
4 series · 4 of 4 positions shown · non-contrast
Comparison: 12/16/2010

CLINICAL DATA: Screening.

DIGITAL BILATERAL SCREENING MAMMOGRAM WITH CAD

[R CC]
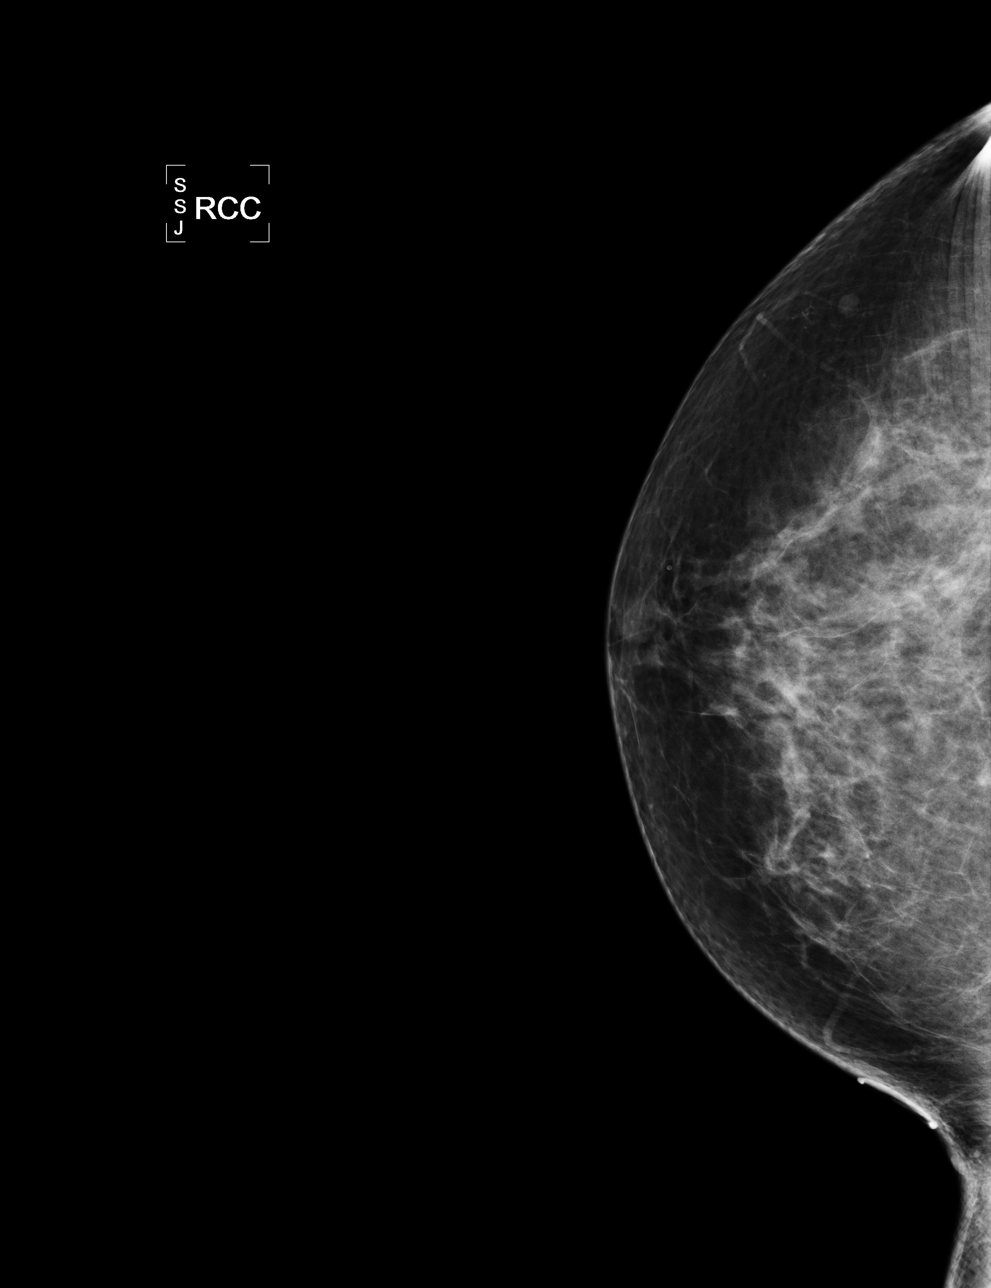

[L CC]
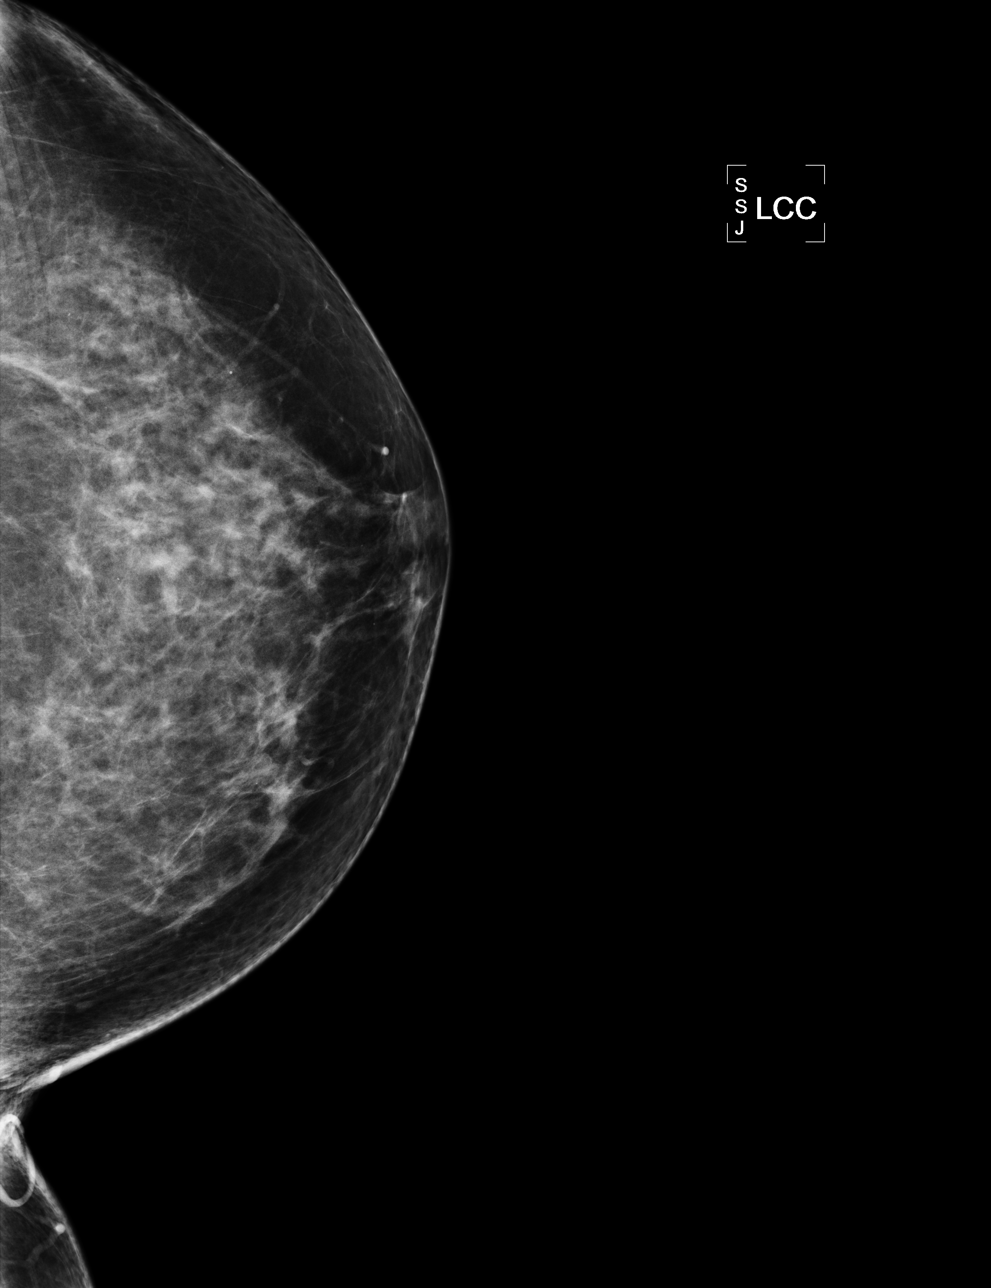

[L MLO]
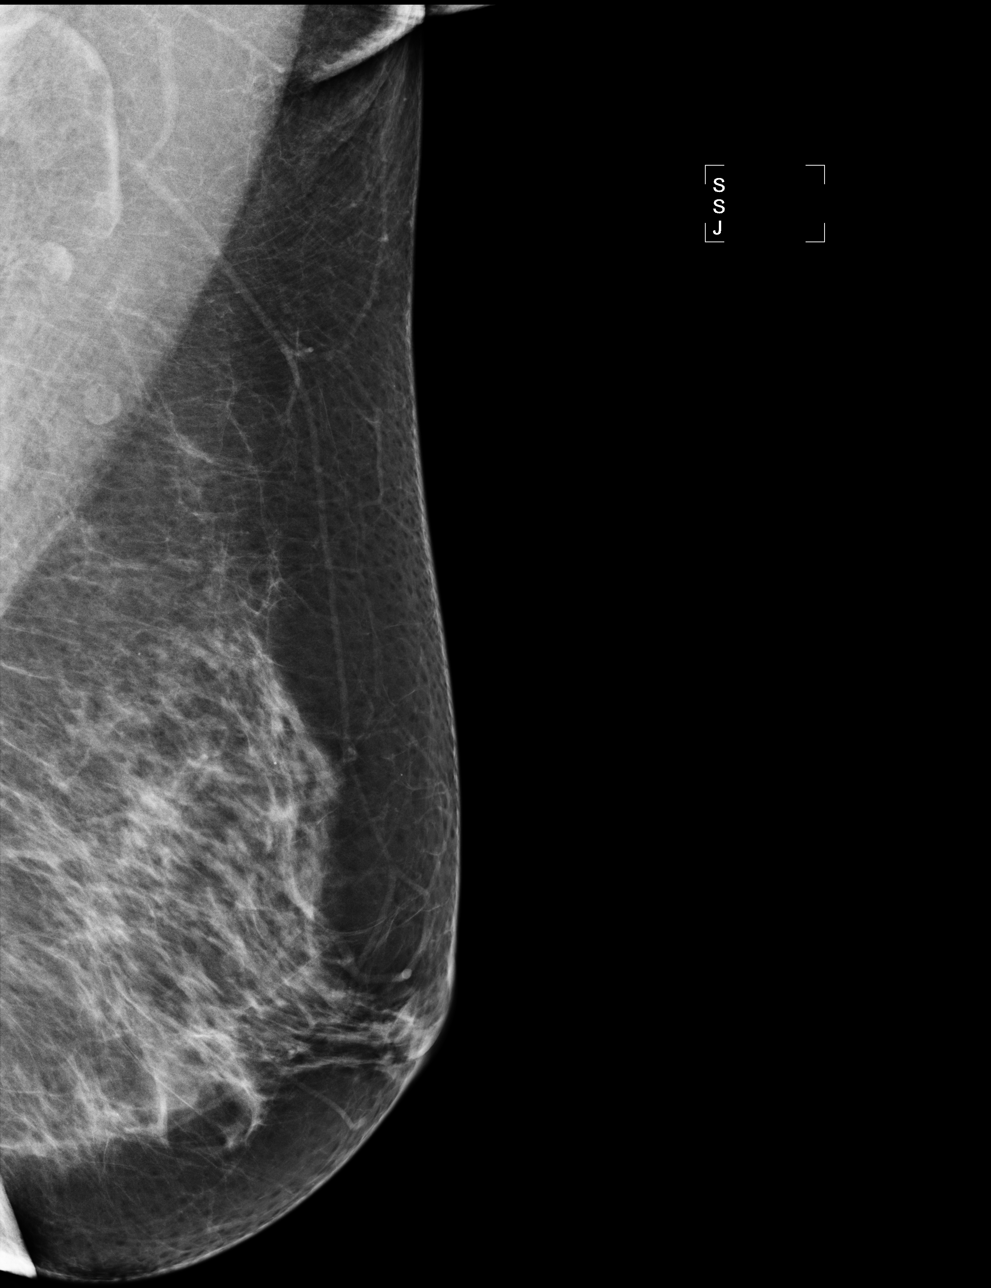

[R MLO]
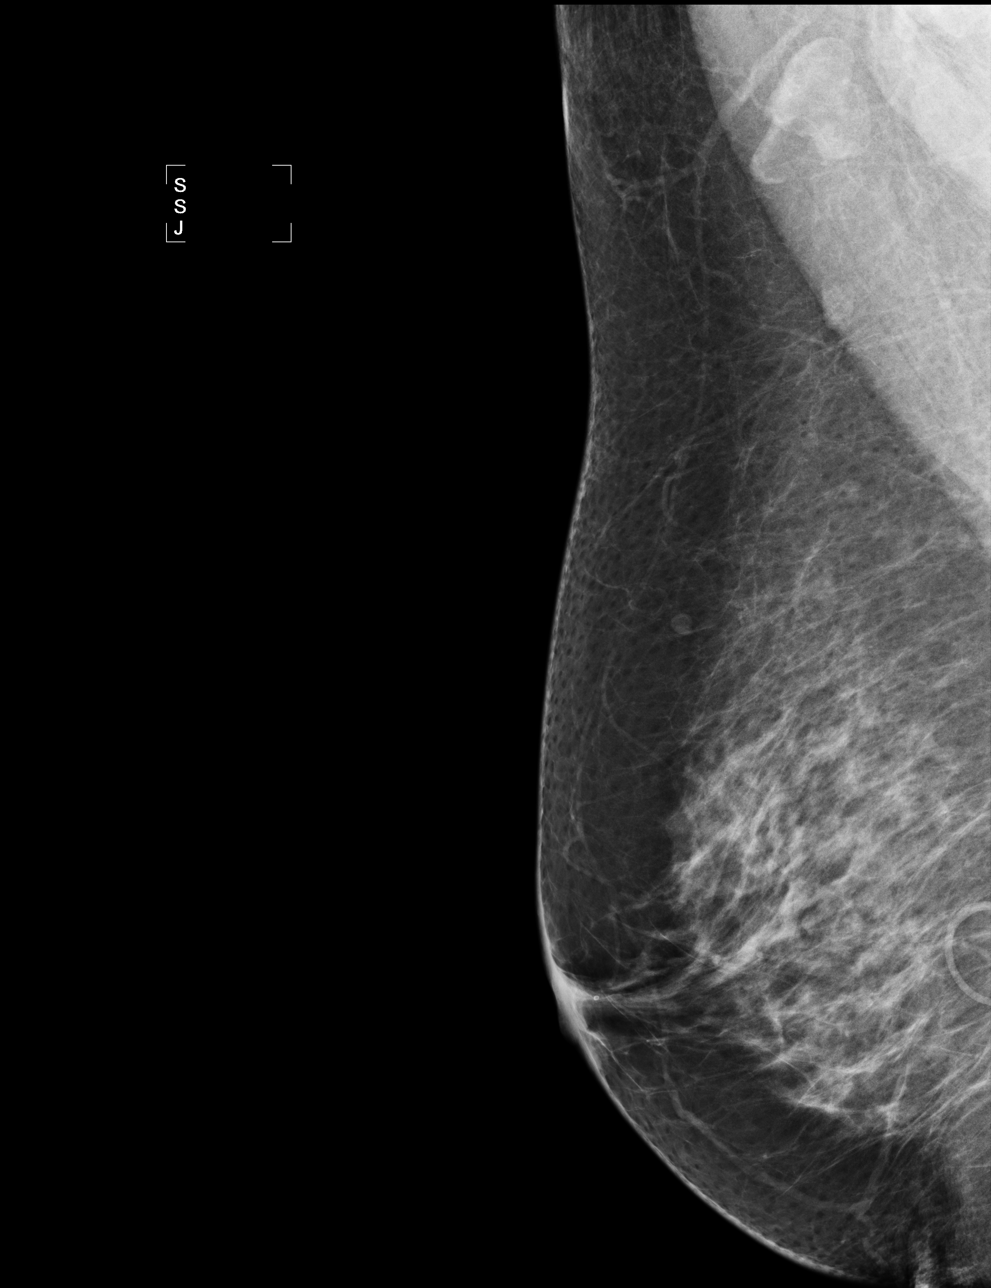

[4 of 4 positions shown; findings below may reference images not displayed]

FINDINGS: There are scattered fibroglandular densities.  There is
no suspicious dominant mass, architectural distortion or
calcification to suggest malignancy.

Images were processed with CAD.
IMPRESSION: No mammographic evidence of malignancy.

A result letter of this screening mammogram will be mailed directly
to the patient.

RECOMMENDATION:
Screening mammogram in one year. (Code:T2-F-RIE)

BI-RADS CATEGORY 1:  Negative

## 2012-06-14 ENCOUNTER — Encounter: Payer: Self-pay | Admitting: Family Medicine

## 2012-06-14 ENCOUNTER — Ambulatory Visit (INDEPENDENT_AMBULATORY_CARE_PROVIDER_SITE_OTHER): Payer: Medicare PPO | Admitting: Family Medicine

## 2012-06-14 VITALS — BP 135/65 | HR 87 | Ht 64.0 in | Wt 152.0 lb

## 2012-06-14 DIAGNOSIS — L738 Other specified follicular disorders: Secondary | ICD-10-CM

## 2012-06-14 DIAGNOSIS — S81802A Unspecified open wound, left lower leg, initial encounter: Secondary | ICD-10-CM

## 2012-06-14 DIAGNOSIS — L0291 Cutaneous abscess, unspecified: Secondary | ICD-10-CM

## 2012-06-14 DIAGNOSIS — L739 Follicular disorder, unspecified: Secondary | ICD-10-CM

## 2012-06-14 MED ORDER — CEPHALEXIN 500 MG PO CAPS: 500.0000 mg | ORAL_CAPSULE | Freq: Three times a day (TID) | ORAL | Status: DC

## 2012-06-14 NOTE — Patient Instructions (Addendum)
Please call if bumps on her bottoms are not resolved.

## 2012-06-14 NOTE — Progress Notes (Signed)
  Subjective:    Patient ID: Michelle Lawrence, female    DOB: 1945/12/12, 66 y.o.   MRN: [id number]  HPI Noticed red bump on her right posterior leg and started to drain yellow pus.  Says it is very tender.  Has noticed some tiny bumps on her bottom (both sides)  for several weeks but these are much smaller.  They don't itch or otherwise bother her.  Has had some vaginal itching. Has been using a topical steroid cream.  She does work in a nursing home.    Review of Systems     Objective:   Physical Exam  Constitutional: She is oriented to person, place, and time. She appears well-developed and well-nourished.  HENT:  Head: Normocephalic and atraumatic.  Cardiovascular: Normal rate, regular rhythm and normal heart sounds.   Pulmonary/Chest: Effort normal and breath sounds normal.  Neurological: She is alert and oriented to person, place, and time.  Skin: Skin is warm and dry.       She has an approximately 1 cm erythematous abscess on the posterior right calf. She has some clear drainage I was able to express. She has some surrounding erythema but no streaking. She also has some fine erythematous bumps on both buttocks. It appears to be consistent with folliculitis.  Psychiatric: She has a normal mood and affect. Her behavior is normal.          Assessment & Plan:  Abscess - will treat with Keflex though she is high risk for MRSA. Unfortunately she is financially struggling so we tried a medication that would be free at [employer]. We did do a wound culture and we'll call her when results are back.  Folliculitis on buttocks-this should actually improve with being on antibiotic. Recommend using antibacterial soap. If this does not improve after the antibiotics, then consider other causes such as scabies.

## 2012-06-16 ENCOUNTER — Other Ambulatory Visit: Payer: Self-pay | Admitting: Family Medicine

## 2012-06-16 MED ORDER — SULFAMETHOXAZOLE-TRIMETHOPRIM 800-160 MG PO TABS: 1.0000 | ORAL_TABLET | Freq: Two times a day (BID) | ORAL | Status: DC

## 2012-06-17 LAB — WOUND CULTURE
Gram Stain: NONE SEEN
Gram Stain: NONE SEEN

## 2012-06-30 ENCOUNTER — Other Ambulatory Visit: Payer: Self-pay | Admitting: *Deleted

## 2012-06-30 MED ORDER — PRAVASTATIN SODIUM 40 MG PO TABS: 40.0000 mg | ORAL_TABLET | Freq: Every day | ORAL | Status: DC

## 2012-07-07 ENCOUNTER — Other Ambulatory Visit: Payer: Self-pay | Admitting: *Deleted

## 2012-08-05 ENCOUNTER — Ambulatory Visit: Payer: Medicare PPO | Admitting: Family Medicine

## 2012-08-15 ENCOUNTER — Ambulatory Visit (INDEPENDENT_AMBULATORY_CARE_PROVIDER_SITE_OTHER): Payer: Medicare PPO | Admitting: Family Medicine

## 2012-08-15 ENCOUNTER — Encounter: Payer: Self-pay | Admitting: Family Medicine

## 2012-08-15 ENCOUNTER — Other Ambulatory Visit: Payer: Self-pay | Admitting: Family Medicine

## 2012-08-15 VITALS — BP 134/69 | HR 81 | Resp 18 | Wt 152.0 lb

## 2012-08-15 DIAGNOSIS — M8580 Other specified disorders of bone density and structure, unspecified site: Secondary | ICD-10-CM

## 2012-08-15 DIAGNOSIS — L293 Anogenital pruritus, unspecified: Secondary | ICD-10-CM

## 2012-08-15 DIAGNOSIS — M949 Disorder of cartilage, unspecified: Secondary | ICD-10-CM

## 2012-08-15 DIAGNOSIS — N898 Other specified noninflammatory disorders of vagina: Secondary | ICD-10-CM

## 2012-08-15 DIAGNOSIS — I10 Essential (primary) hypertension: Secondary | ICD-10-CM

## 2012-08-15 NOTE — Patient Instructions (Addendum)
We will call you with your lab results. If you don't here from us in about a week then please give us a call at [phone].  

## 2012-08-15 NOTE — Progress Notes (Signed)
  Subjective:    Patient ID: Michelle Lawrence, female    DOB: [birth year], 66 y.o.   MRN: [id number]  HPI HTN-Pt denies chest pain, SOB, dizziness, or heart palpitations.  Taking meds as directed w/o problems.  Denies medication side effects.  Occ feels tired on it. Has been walking for exercise.  Osteopenia- has questions about her calcium and vit D. She brought in her bottle. Wants to know how much to take.   Has vaginal itching only at night for several months. Doesn't bother her during the daytime.  No change in discharge or odor.  Last pelvic exam was about 2 years ago.  Hx of hysterectomy.     Review of Systems     Past Surgical History  Procedure Date  . Total abdominal hysterectomy 1981    w/ BSO for cancerous reasons  . Laparoscopic cholecystectomy 2004    Objective:   Physical Exam  Constitutional: She is oriented to person, place, and time. She appears well-developed and well-nourished.  HENT:  Head: Normocephalic and atraumatic.  Cardiovascular: Normal rate, regular rhythm and normal heart sounds.   Pulmonary/Chest: Effort normal and breath sounds normal.  Neurological: She is alert and oriented to person, place, and time.  Skin: Skin is warm and dry.  Psychiatric: She has a normal mood and affect. Her behavior is normal.          Assessment & Plan:  HTN - Well controlled. F/U in 6 months.   Vaginal itching - new problem. Will start with wet prep. We'll call with results once available. Could also be vaginal atrophy based on her menopause.  Can try a topical vaginal estrogen.  If not better then we need to do a pelvic exam.    Osteopenia - Instructed her on how much calcium and vit D to take.  Encourage regular exercise.  Due for repeat DEXA in 2 years.

## 2012-08-22 LAB — COMPLETE METABOLIC PANEL WITH GFR
ALT: 16 U/L (ref 0–35)
Alkaline Phosphatase: 56 U/L (ref 39–117)
CO2: 28 mEq/L (ref 19–32)
Creat: 0.54 mg/dL (ref 0.50–1.10)
GFR, Est African American: >89 mL/min
GFR, Est Non African American: >89 mL/min
Total Bilirubin: 0.5 mg/dL (ref 0.3–1.2)

## 2012-08-22 LAB — LIPID PANEL
HDL: 45 mg/dL (ref >39)
LDL Cholesterol: 137 mg/dL — ABNORMAL HIGH (ref 0–99)
Triglycerides: 97 mg/dL (ref <150)

## 2012-08-22 LAB — WET PREP BY MOLECULAR PROBE: Gardnerella vaginalis: NEGATIVE

## 2012-08-25 ENCOUNTER — Telehealth: Payer: Self-pay | Admitting: *Deleted

## 2012-08-25 MED ORDER — ROSUVASTATIN CALCIUM 20 MG PO TABS: 20.0000 mg | ORAL_TABLET | Freq: Every day | ORAL | Status: DC

## 2012-08-25 NOTE — Telephone Encounter (Signed)
Lab result note closed by accident- pt request to go back on the Crestor 20mg  for her cholesterol since numbers elevated. Per Dr. Linford Arnold will send in to pharmacy Crestor 20mg  take one tab at bedtime. Med sent removed pravastatin and pt notified

## 2012-08-30 ENCOUNTER — Emergency Department (INDEPENDENT_AMBULATORY_CARE_PROVIDER_SITE_OTHER)
Admission: EM | Admit: 2012-08-30 | Discharge: 2012-08-30 | Disposition: A | Discharge status: Home / Self Care | Payer: Medicare PPO | Source: Home / Self Care | Attending: Family Medicine | Admitting: Family Medicine

## 2012-08-30 ENCOUNTER — Encounter: Payer: Self-pay | Admitting: *Deleted

## 2012-08-30 DIAGNOSIS — J069 Acute upper respiratory infection, unspecified: Secondary | ICD-10-CM

## 2012-08-30 DIAGNOSIS — Z0000 Encounter for general adult medical examination without abnormal findings: Secondary | ICD-10-CM

## 2012-08-30 DIAGNOSIS — J029 Acute pharyngitis, unspecified: Secondary | ICD-10-CM

## 2012-08-30 LAB — POCT RAPID STREP A (OFFICE): Rapid Strep A Screen: NEGATIVE

## 2012-08-30 MED ORDER — AMOXICILLIN 875 MG PO TABS: 875.0000 mg | ORAL_TABLET | Freq: Two times a day (BID) | ORAL | Status: DC

## 2012-08-30 MED ORDER — BENZONATATE 200 MG PO CAPS: 200.0000 mg | ORAL_CAPSULE | Freq: Every day | ORAL | Status: DC

## 2012-08-30 NOTE — ED Notes (Signed)
Patient c/o sinus drainage, sneezing and ear pressure x 4 days. No otc meds taken. Denies fever

## 2012-08-30 NOTE — ED Provider Notes (Signed)
History     CSN: [id number]  Arrival date & time 08/30/12  1526   First MD Initiated Contact with Patient 08/30/12 1550      Chief Complaint  Patient presents with  . Sinus Problem      HPI Comments: Patient began developing sinus congestion, sneezing, ear pressure, and mild sore throat about 4 days ago.  She has not had a cough.  She has had chills.  The history is provided by the patient.    Past Medical History  Diagnosis Date  . Hypertension   . Hyperlipidemia   . Cancer 1981    enfometrial or cervical?    Past Surgical History  Procedure Date  . Total abdominal hysterectomy 1981    w/ BSO for cancerous reasons  . Laparoscopic cholecystectomy 2004    Family History  Problem Relation Age of Onset  . Other Mother     CHF  . Cancer Father     lung  . Cancer Sister     liver  . Cancer Brother     lung  . Hyperlipidemia Sister   . Hyperlipidemia Maternal Uncle     History  Substance Use Topics  . Smoking status: Never Smoker   . Smokeless tobacco: Not on file  . Alcohol Use: No    OB History    Grav Para Term Preterm Abortions TAB SAB Ect Mult Living                  Review of Systems + sore throat No cough No pleuritic pain No wheezing + nasal congestion + post-nasal drainage + sinus pain/pressure No itchy/red eyes No earache No hemoptysis No SOB No fever, + chills No nausea No vomiting No abdominal pain No diarrhea No urinary symptoms No skin rashes + fatigue + myalgias + headache Used OTC meds without relief  Allergies  Codeine and Latex  Home Medications   Current Outpatient Rx  Name  Route  Sig  Dispense  Refill  . AMOXICILLIN 875 MG PO TABS   Oral   Take 1 tablet (875 mg total) by mouth 2 (two) times daily. (Rx void after 09/08/12)   20 tablet   0   . BENAZEPRIL-HYDROCHLOROTHIAZIDE 20-25 MG PO TABS   Oral   Take 1 tablet by mouth daily.   30 tablet   6   . BENZONATATE 200 MG PO CAPS   Oral   Take 1 capsule  (200 mg total) by mouth at bedtime. Take as needed for cough   12 capsule   0   . CALCIUM CARBONATE-VITAMIN D 500-200 MG-UNIT PO TABS   Oral   Take 1 tablet by mouth 2 (two) times daily. 250 IU vitamin D per 1 tab 200 mg calcium per 1 tab         . OMEGA-3 FATTY ACIDS 1000 MG PO CAPS   Oral   Take 2 g by mouth daily.           Marland Kitchen ROSUVASTATIN CALCIUM 20 MG PO TABS   Oral   Take 1 tablet (20 mg total) by mouth at bedtime.   30 tablet   3   . VITAMIN B-12 500 MCG PO TABS   Oral   Take 500 mcg by mouth every other day.            BP 126/75  Pulse 92  Temp 99 F (37.2 C) (Oral)  Resp 14  Ht 5\' 4"  (1.626 m)  Wt 152  lb (68.947 kg)  BMI 26.09 kg/m2  SpO2 96%  Physical Exam Nursing notes and Vital Signs reviewed. Appearance:  Patient appears healthy, stated age, and in no acute distress Eyes:  Pupils are equal, round, and reactive to light and accomodation.  Extraocular movement is intact.  Conjunctivae are not inflamed  Ears:  Canals normal.  Tympanic membranes normal.  Nose:  Mildly congested turbinates.  No sinus tenderness.   Pharynx:  Minimal erythema Neck:  Supple.  Tender enlarged anterior/posterior nodes are palpated bilaterally  Lungs:  Clear to auscultation.  Breath sounds are equal.  Heart:  Regular rate and rhythm without murmurs, rubs, or gallops.  Abdomen:  Nontender without masses or hepatosplenomegaly.  Bowel sounds are present.  No CVA or flank tenderness.  Extremities:  No edema.  No calf tenderness Skin:  No rash present.   ED Course  Procedures  none   Labs Reviewed  POCT RAPID STREP A (OFFICE) negative      1. Acute pharyngitis   2. Acute upper respiratory infections of unspecified site; suspect early viral URI       MDM  There is no evidence of bacterial infection today.   Treat symptomatically for now  Throat culture pending. Prescription written for Benzonatate [facility]) to take at bedtime for night-time cough.  Take plain  Mucinex (guaifenesin) twice daily for cough and congestion.  Increase fluid intake, rest. May use Afrin nasal spray (or generic oxymetazoline) twice daily for about 5 days.  Also recommend using saline nasal spray several times daily and saline nasal irrigation (AYR is a common brand) Stop all antihistamines for now, and other non-prescription cough/cold preparations. Begin Amoxicillin if not improving about 5 days or if persistent fever develops. Follow-up with family doctor if not improving 7 to 10 days.         Lattie Haw, MD 08/31/12 1006

## 2012-09-01 ENCOUNTER — Ambulatory Visit: Payer: Medicare PPO | Admitting: Family Medicine

## 2012-09-06 ENCOUNTER — Telehealth: Payer: Self-pay

## 2012-09-06 NOTE — Telephone Encounter (Signed)
Patient advised.

## 2012-09-06 NOTE — Telephone Encounter (Signed)
Recommend drink lots and lots of fluids.  If still having tightness in her chest then make appt so we can recheck her to make sure her infection has cleared.

## 2012-09-06 NOTE — Telephone Encounter (Signed)
Michelle Lawrence was seen in the Urgent Care last week and treated with Amoxicillin 875 mg. She is now very dry. Her throat and mouth is dry. She feels tightness in her chest at night. What can she do about the dryness.

## 2012-10-10 ENCOUNTER — Ambulatory Visit: Payer: Medicare PPO | Admitting: Family Medicine

## 2012-10-11 ENCOUNTER — Other Ambulatory Visit: Payer: Self-pay | Admitting: Family Medicine

## 2012-11-04 ENCOUNTER — Telehealth: Payer: Self-pay

## 2012-11-04 DIAGNOSIS — E785 Hyperlipidemia, unspecified: Secondary | ICD-10-CM

## 2012-11-04 NOTE — Telephone Encounter (Signed)
Lets recheck now.  Ok to print slip and she can go fasting anytime.

## 2012-11-04 NOTE — Telephone Encounter (Signed)
Michelle Lawrence is calling to find out when she should have her lipids rechecked. She went back on Crestor in January.

## 2012-11-04 NOTE — Telephone Encounter (Signed)
Patient advised and lab faxed down stairs.

## 2012-11-08 ENCOUNTER — Telehealth: Payer: Self-pay | Admitting: *Deleted

## 2012-11-08 LAB — LIPID PANEL: HDL: 48 mg/dL (ref >39)

## 2012-11-08 NOTE — Telephone Encounter (Signed)
Message copied by Edilia Bo on Tue Nov 08, 2012 11:02 AM ------      Message from: Nani Gasser D      Created: Tue Nov 08, 2012  7:45 AM       Call patient: Cholesterol looks absolutely fantastic. Continue Crestor. ------

## 2012-12-09 ENCOUNTER — Ambulatory Visit: Payer: Medicare PPO | Admitting: Family Medicine

## 2012-12-19 ENCOUNTER — Ambulatory Visit (INDEPENDENT_AMBULATORY_CARE_PROVIDER_SITE_OTHER): Payer: Medicare PPO | Admitting: Family Medicine

## 2012-12-19 ENCOUNTER — Encounter: Payer: Self-pay | Admitting: Family Medicine

## 2012-12-19 VITALS — BP 124/67 | HR 77 | Wt 153.0 lb

## 2012-12-19 DIAGNOSIS — L600 Ingrowing nail: Secondary | ICD-10-CM

## 2012-12-19 NOTE — Progress Notes (Signed)
  Subjective:    Patient ID: Michelle Lawrence, female    DOB: 01-15-1946, 67 y.o.   MRN: [id number]  HPI Left great toenail is ingrown medially again. Had similar problem a year ago and we did a parital then.    Review of Systems     Objective:   Physical Exam        Assessment & Plan:  Unable to complete visit as buildign had to be evacuated.  Pt will reschedule for thrusday.

## 2012-12-23 ENCOUNTER — Encounter: Payer: Self-pay | Admitting: Family Medicine

## 2012-12-23 ENCOUNTER — Ambulatory Visit (INDEPENDENT_AMBULATORY_CARE_PROVIDER_SITE_OTHER): Payer: Medicare PPO | Admitting: Family Medicine

## 2012-12-23 VITALS — BP 122/68 | HR 70

## 2012-12-23 DIAGNOSIS — L600 Ingrowing nail: Secondary | ICD-10-CM

## 2012-12-23 MED ORDER — HYDROCODONE-ACETAMINOPHEN 5-325 MG PO TABS: 1.0000 | ORAL_TABLET | Freq: Three times a day (TID) | ORAL | Status: DC | PRN

## 2012-12-23 NOTE — Addendum Note (Signed)
Addended by: Nani Gasser D on: 12/23/2012 12:02 PM   Modules accepted: Orders

## 2012-12-23 NOTE — Patient Instructions (Signed)
He can remove the dressing after 24 hours. At that point he can clean with regular soap and water and pat dry in the shower. Do not apply alcohol or peroxide or similar-type products. Just apply Vaseline and fresh gauze. You will likely need to keep covered for about 4-5 days. Please call if any signs of infection. He can use the prescribed pain medication as needed. After you were now you should be able to just use over-the-counter Tylenol or ibuprofen for pain relief. Try to keep her foot elevated today. If you do have any blood through the gauze then you can change today if needed.

## 2012-12-23 NOTE — Progress Notes (Signed)
  Subjective:    Patient ID: Michelle Lawrence, female    DOB: [birth year], 67 y.o.   MRN: [id number]  HPI Her for toenail removal on the medial left great toenail.  Rescheduled from Monday.  She opted not for cautery today.    Review of Systems     Objective:   Physical Exam        Assessment & Plan:  Ingrown toenail -   Toenail Avulsion Procedure Note  Pre-operative Diagnosis: Left Ingrown Great toenail   Post-operative Diagnosis: Left Ingrown Great toenail  Indications: pain and inflammation  Anesthesia: Lidocaine 1% without epinephrine with added sodium bicarbonate  Procedure Details  History of allergy to iodine: no  The risks (including bleeding and infection) and benefits of the  procedure and Verbal informed consent obtained.  After digital block anesthesia was obtained, a tourniquet was applied for hemostasis during the procedure.  After prepping with Betadine, the offending edge of the nail was freed from the nailbed and perionychium, and then split with scissors and removed with  forceps.  All visible granulation tissue is debrided. Antibiotic and bulky dressing was applied.   Findings: inflammed border of nail  Complications: none.  Plan: 1. Soak the foot twice daily. Change dressing twice daily until healed over. 2. Warning signs of infection were reviewed.   3. Recommended that the patient use Vicodin as needed for pain.  4. Return prn.

## 2012-12-29 ENCOUNTER — Telehealth: Payer: Self-pay | Admitting: *Deleted

## 2012-12-29 NOTE — Telephone Encounter (Signed)
Pt calls and wants to know if she can take the CoQ10 with her Crestor?

## 2012-12-30 NOTE — Telephone Encounter (Signed)
It is okay to take Crestor and take CoQ 10.

## 2013-01-02 NOTE — Telephone Encounter (Signed)
Pt.notified

## 2013-01-05 ENCOUNTER — Ambulatory Visit: Payer: Medicare PPO | Admitting: Family Medicine

## 2013-01-11 ENCOUNTER — Other Ambulatory Visit: Payer: Self-pay | Admitting: Family Medicine

## 2013-01-16 ENCOUNTER — Ambulatory Visit: Payer: Medicare PPO | Admitting: Family Medicine

## 2013-02-09 ENCOUNTER — Ambulatory Visit (INDEPENDENT_AMBULATORY_CARE_PROVIDER_SITE_OTHER): Payer: Medicare PPO | Admitting: Family Medicine

## 2013-02-09 ENCOUNTER — Encounter: Payer: Self-pay | Admitting: Family Medicine

## 2013-02-09 VITALS — BP 113/53 | HR 66 | Wt 153.0 lb

## 2013-02-09 DIAGNOSIS — N760 Acute vaginitis: Secondary | ICD-10-CM

## 2013-02-09 DIAGNOSIS — G4700 Insomnia, unspecified: Secondary | ICD-10-CM

## 2013-02-09 DIAGNOSIS — D179 Benign lipomatous neoplasm, unspecified: Secondary | ICD-10-CM

## 2013-02-09 DIAGNOSIS — I10 Essential (primary) hypertension: Secondary | ICD-10-CM

## 2013-02-09 MED ORDER — BENAZEPRIL-HYDROCHLOROTHIAZIDE 20-12.5 MG PO TABS: 1.0000 | ORAL_TABLET | Freq: Every day | ORAL | Status: DC

## 2013-02-09 NOTE — Progress Notes (Signed)
  Subjective:    Patient ID: Michelle Lawrence, female    DOB: 07/29/1946, 67 y.o.   MRN: [id number]  HPI  Vaginal irriatation - pt has not tried any OTC meds to help with this and she is also having some vaginal itching esp. @ night. Had complete hysterectomy in her 30s.  Was on premarin, oral, until abut her mid-50s.     Insomnia - not sleeping well  feels like she has a lot of things running thru her mind at night . she would like to go back on zoloft since that helped her in the past.  She says she has tried things like Benadryl the past but does the opposite and actually keeps her awake. She says occasionally she will have a glass of ice tea in the evening but this is not every evening. She doesn't have any excessive caffeine intake. She does try to go to bed around the same time each night around 9:00 and wakes up around the same time. She rarely takes naps. She's never been on prescription sleep aids but again in the past when she was on Zoloft she noticed that she slept much better. She wonders if she should go back on it.  She also has a mass on her left hip. She says she was told in the past with a lipoma but wanted me to look at it as well.  HTN- Pt denies chest pain, SOB, dizziness, or heart palpitations.  Taking meds as directed w/o problems.  Denies medication side effects.   Review of Systems     Objective:   Physical Exam  Constitutional: She appears well-developed and well-nourished.  HENT:  Head: Normocephalic and atraumatic.  Cardiovascular: Normal rate.   Genitourinary:  Vaginal tissue is thin and atrophied. New lesions. No abnormal discharge. Vaginal cuff appears normal. Uterus is absent. No palpable pelvic masses or lesions.  Skin: Skin is warm and dry.  Psychiatric: She has a normal mood and affect. Her behavior is normal.     She does have a large lipoma on her left side.     Assessment & Plan:  Vaginitis-I. suspect secondary to vaginal atrophy. I think she would  benefit from a Premarin or estrogen cream. She can also try over-the-counter feminine moisturizers. We did collect a wet prep today and I will call her with the results. If everything is negative then we can try the Premarin cream. Followup in one month.  Hypertension-blood pressures actually little low today. She's very concerned about this. We will decrease her benazepril HCTZ to 20/12.5 mg. Followup in one month to recheck blood pressure.  Insomnia-I certainly think it is reasonable to restart the Zoloft. Especially because most of her difficulty with sleep is related to her mind racing and feeling anxious. We can followup in one month to see how she's doing on the medication. She says she prefers to stay at a low dose on this.  Lipoma-gave reassurance. Typically these are benign and do not require surgical intervention. If it's bothersome to her she certainly could consider that option. She says occasionally sore when she falls asleep on that side but otherwise is not bothersome.

## 2013-02-09 NOTE — Addendum Note (Signed)
Addended by: Deno Etienne on: 02/09/2013 12:00 PM   Modules accepted: Orders

## 2013-02-10 ENCOUNTER — Telehealth: Payer: Self-pay | Admitting: Family Medicine

## 2013-02-10 LAB — WET PREP, GENITAL
Clue Cells Wet Prep HPF POC: NONE SEEN
Trich, Wet Prep: NONE SEEN

## 2013-02-10 MED ORDER — SERTRALINE HCL 25 MG PO TABS: 25.0000 mg | ORAL_TABLET | Freq: Every day | ORAL | Status: DC

## 2013-02-10 NOTE — Telephone Encounter (Signed)
Pt called and left a message and states she seen Dr. Linford Arnold 02-09-13 and she thought she was calling in Zoloft for her but when she went to the pharmacy it was not there.Please call patient back and let her know the status on this Rx. Thanks, [employer]

## 2013-02-10 NOTE — Telephone Encounter (Signed)
Rx sent . Called pt no answer.Michelle Lawrence

## 2013-02-14 ENCOUNTER — Telehealth: Payer: Self-pay | Admitting: *Deleted

## 2013-02-14 ENCOUNTER — Encounter: Payer: Self-pay | Admitting: *Deleted

## 2013-02-14 NOTE — Telephone Encounter (Signed)
Pt called today & stated that yesterday she took her 4th pill of the zoloft yesterday & had some side effects.  She stated that she had some chest discomfort, her left arm "felt funny", abd pain, felt sweaty & clammy, & had 4 bowel movements last night.  She's also asking for a work note for today.  She stayed home due to feeling weak from last night's events.

## 2013-02-15 NOTE — Telephone Encounter (Signed)
Pt was notified yesterday by Loralee Pacas, CMA

## 2013-02-15 NOTE — Telephone Encounter (Signed)
I really don't think that the symptoms were from her Zoloft. If she's actually having any chest discomfort and she probably needs to be seen especially at age 67 to make sure there is not something cardiac going on. The symptoms would be very unusual to be from Zoloft especially after only 4 days on a very low dose. She can have a work note stating that she call here today but we cannot give her one saying that she was seen.

## 2013-02-16 ENCOUNTER — Ambulatory Visit: Payer: Medicare PPO | Admitting: Family Medicine

## 2013-03-23 ENCOUNTER — Ambulatory Visit: Payer: Medicare PPO | Admitting: Family Medicine

## 2013-03-27 ENCOUNTER — Ambulatory Visit: Payer: Medicare PPO | Admitting: Family Medicine

## 2013-04-05 ENCOUNTER — Other Ambulatory Visit: Payer: Self-pay | Admitting: Family Medicine

## 2013-04-10 ENCOUNTER — Encounter: Payer: Self-pay | Admitting: Family Medicine

## 2013-04-10 ENCOUNTER — Ambulatory Visit (INDEPENDENT_AMBULATORY_CARE_PROVIDER_SITE_OTHER): Payer: Medicare PPO | Admitting: Family Medicine

## 2013-04-10 VITALS — BP 120/66 | HR 69 | Wt 154.0 lb

## 2013-04-10 DIAGNOSIS — Z23 Encounter for immunization: Secondary | ICD-10-CM

## 2013-04-10 DIAGNOSIS — I10 Essential (primary) hypertension: Secondary | ICD-10-CM

## 2013-04-10 NOTE — Progress Notes (Signed)
  Subjective:    Patient ID: Michelle Lawrence, female    DOB: Jan 06, 1946, 67 y.o.   MRN: [id number]  HPI HTN - Due for BP check.  Tolerating meds well.    Review of Systems     Objective:   Physical Exam        Assessment & Plan:  HTN - well controlled. F/U in 2 weeks with MD.

## 2013-04-24 ENCOUNTER — Encounter: Payer: Self-pay | Admitting: Family Medicine

## 2013-04-24 ENCOUNTER — Ambulatory Visit (INDEPENDENT_AMBULATORY_CARE_PROVIDER_SITE_OTHER): Payer: Medicare PPO | Admitting: Family Medicine

## 2013-04-24 VITALS — BP 111/57 | HR 75 | Wt 153.0 lb

## 2013-04-24 DIAGNOSIS — E785 Hyperlipidemia, unspecified: Secondary | ICD-10-CM

## 2013-04-24 DIAGNOSIS — IMO0001 Reserved for inherently not codable concepts without codable children: Secondary | ICD-10-CM

## 2013-04-24 DIAGNOSIS — R197 Diarrhea, unspecified: Secondary | ICD-10-CM

## 2013-04-24 DIAGNOSIS — M791 Myalgia, unspecified site: Secondary | ICD-10-CM

## 2013-04-24 DIAGNOSIS — I10 Essential (primary) hypertension: Secondary | ICD-10-CM

## 2013-04-24 MED ORDER — BENAZEPRIL HCL 20 MG PO TABS: 20.0000 mg | ORAL_TABLET | Freq: Every day | ORAL | Status: DC

## 2013-04-24 NOTE — Progress Notes (Signed)
Subjective:    Patient ID: Michelle Lawrence, female    DOB: [birth year], 67 y.o.   MRN: [id number]  HPI HTN -  Pt denies chest pain, SOB, dizziness, or heart palpitations.  Taking meds as directed w/o problems.  Denies medication side effects.    Diarrhea - says she hasn't felt well since she got a flu shot. 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 She's had some diarrhea. Says has been nauseated but no vomiting. No fever, chills, or sweats. Has felt achey.  Has had a mild sore throat.    Hyperlipidemia-tolerating statin well any pounds her side effects.Switched back to Crestor about 8 mo ago.  Review of Systems  BP 111/57  Pulse 75  Wt 153 lb (69.4 kg)  BMI 26.25 kg/m2    Allergies  Allergen Reactions  . Codeine   . Latex     Past Medical History  Diagnosis Date  . Hypertension   . Hyperlipidemia   . Cancer 1981    enfometrial or cervical?    Past Surgical History  Procedure Laterality Date  . Total abdominal hysterectomy  1981    w/ BSO for cancerous reasons  . Laparoscopic cholecystectomy  2004    History   Social History  . Marital Status: Married    Spouse Name: N/A    Number of Children: N/A  . Years of Education: N/A   Occupational History  . Not on file.   Social History Main Topics  . Smoking status: Never Smoker   . Smokeless tobacco: Not on file  . Alcohol Use: No  . Drug Use: No  . Sexual Activity:    Other Topics Concern  . Not on file   Social History Narrative  . No narrative on file    Family History  Problem Relation Age of Onset  . Other Mother     CHF  . Cancer Father     lung  . Cancer Sister     liver  . Cancer Brother     lung  . Hyperlipidemia Sister   . Hyperlipidemia Maternal Uncle     Outpatient Encounter Prescriptions as of 04/24/2013  Medication Sig Dispense Refill  . calcium-vitamin D (OSCAL WITH D) 500-200 MG-UNIT per tablet Take 1 tablet by mouth 2 (two) times daily. 250 IU  vitamin D per 1 tab 200 mg calcium per 1 tab      . CRESTOR 20 MG tablet TAKE 1 TABLET (20 MG TOTAL) BY MOUTH AT BEDTIME.  30 tablet  3  . vitamin B-12 (CYANOCOBALAMIN) 500 MCG tablet Take 500 mcg by mouth every other day.       . [DISCONTINUED] benazepril-hydrochlorthiazide (LOTENSIN HCT) 20-12.5 MG per tablet Take 1 tablet by mouth daily.  30 tablet  3  . benazepril (LOTENSIN) 20 MG tablet Take 1 tablet (20 mg total) by mouth daily.  90 tablet  1  . [DISCONTINUED] sertraline (ZOLOFT) 25 MG tablet Take 1 tablet (25 mg total) by mouth daily.  90 tablet  1   No facility-administered encounter medications on file as of 04/24/2013.          Objective:   Physical Exam  Constitutional: She is oriented to person, place, and time. She appears well-developed and well-nourished.  HENT:  Head: Normocephalic and atraumatic.  Right Ear: External ear normal.  Left Ear: External ear normal.  Nose: Nose normal.  Mouth/Throat: Oropharynx is clear and moist.  TMs and  canals are clear.   Eyes: Conjunctivae and EOM are normal. Pupils are equal, round, and reactive to light.  Neck: Neck supple. No thyromegaly present.  Cardiovascular: Normal rate, regular rhythm and normal heart sounds.   Pulmonary/Chest: Effort normal and breath sounds normal. She has no wheezes.  Abdominal: Soft. Bowel sounds are normal. She exhibits no distension and no mass. There is no tenderness. There is no rebound and no guarding.  Lymphadenopathy:    She has no cervical adenopathy.  Neurological: She is alert and oriented to person, place, and time.  Skin: Skin is warm and dry.  Psychiatric: She has a normal mood and affect.          Assessment & Plan:  HTN - Well controlled on current regimen. Stop HCTZ. Due to rcheck electrolytes.  Followup in 6 months.  Diarrhea - likley viral. Will check CBC. Make sure staying hydrated. BP down a little.    Hyperlipidemia - has been back on crestor 20mg  for 8 months . Will  recheck labs today. givne slip to go anytime  Flu vac uptodate.

## 2013-04-28 ENCOUNTER — Other Ambulatory Visit: Payer: Self-pay | Admitting: Family Medicine

## 2013-04-28 DIAGNOSIS — Z1231 Encounter for screening mammogram for malignant neoplasm of breast: Secondary | ICD-10-CM

## 2013-05-01 ENCOUNTER — Other Ambulatory Visit: Payer: Self-pay | Admitting: *Deleted

## 2013-05-01 ENCOUNTER — Other Ambulatory Visit (INDEPENDENT_AMBULATORY_CARE_PROVIDER_SITE_OTHER): Payer: Medicare HMO | Admitting: *Deleted

## 2013-05-01 DIAGNOSIS — Z1211 Encounter for screening for malignant neoplasm of colon: Secondary | ICD-10-CM

## 2013-05-01 LAB — LIPID PANEL
Cholesterol: 141 mg/dL (ref 0–200)
Total CHOL/HDL Ratio: 3.4 Ratio
Triglycerides: 100 mg/dL (ref <150)
VLDL: 20 mg/dL (ref 0–40)

## 2013-05-01 LAB — COMPLETE METABOLIC PANEL WITH GFR
AST: 13 U/L (ref 0–37)
Alkaline Phosphatase: 70 U/L (ref 39–117)
BUN: 13 mg/dL (ref 6–23)
Creat: 0.56 mg/dL (ref 0.50–1.10)
Total Bilirubin: 0.3 mg/dL (ref 0.3–1.2)

## 2013-05-01 LAB — CBC WITH DIFFERENTIAL/PLATELET
Basophils Relative: 0 % (ref 0–1)
Eosinophils Absolute: 0.2 10*3/uL (ref 0.0–0.7)
Hemoglobin: 12.8 g/dL (ref 12.0–15.0)
MCH: 30 pg (ref 26.0–34.0)
MCHC: 34.1 g/dL (ref 30.0–36.0)
Monocytes Relative: 7 % (ref 3–12)
Neutrophils Relative %: 68 % (ref 43–77)
Platelets: 302 10*3/uL (ref 150–400)

## 2013-05-01 LAB — POC HEMOCCULT BLD/STL (HOME/3-CARD/SCREEN)
Card #1 Date: 9112014
Card #3 Date: 9132014
Card #3 Fecal Occult Blood, POC: NEGATIVE

## 2013-05-09 ENCOUNTER — Telehealth: Payer: Self-pay | Admitting: *Deleted

## 2013-05-09 MED ORDER — TRIAMCINOLONE ACETONIDE 0.5 % EX OINT: TOPICAL_OINTMENT | Freq: Every day | CUTANEOUS | Status: AC | PRN

## 2013-05-09 NOTE — Telephone Encounter (Signed)
Pt notified med sent to pharmacy. Britainy Kozub, LPN  

## 2013-05-09 NOTE — Telephone Encounter (Signed)
Tried to call pt. No VM, kept ringing.  Meyer Cory, LPN

## 2013-05-09 NOTE — Telephone Encounter (Signed)
Pt states she is broke out on her hands from the gloves she wears at work and would like to know if you will send her some cream for them to her pharmacy.  Meyer Cory, LPN

## 2013-05-18 ENCOUNTER — Ambulatory Visit (INDEPENDENT_AMBULATORY_CARE_PROVIDER_SITE_OTHER): Payer: Medicare PPO

## 2013-05-18 DIAGNOSIS — Z1231 Encounter for screening mammogram for malignant neoplasm of breast: Secondary | ICD-10-CM

## 2013-05-18 IMAGING — MG MM DIGITAL SCREENING
5 series · 5 of 5 positions shown · non-contrast
Comparison: Previous exam(s).

CLINICAL DATA: Screening.

EXAM:
DIGITAL SCREENING BILATERAL MAMMOGRAM WITH CAD

[R CC]
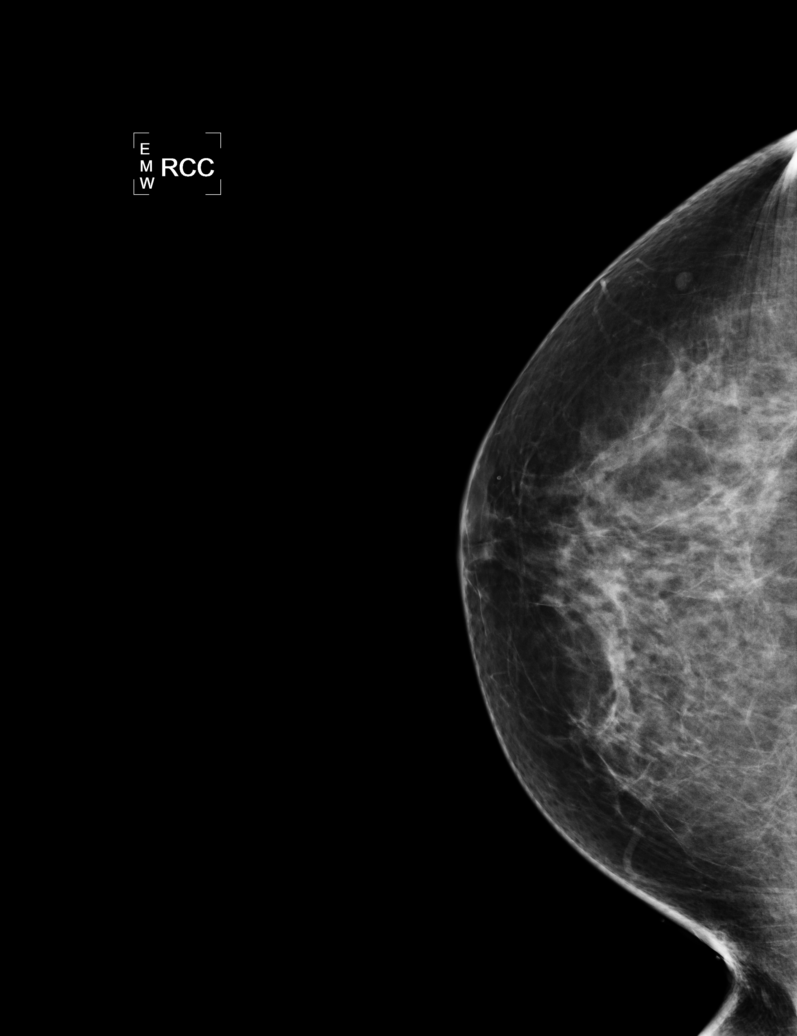

[L CC]
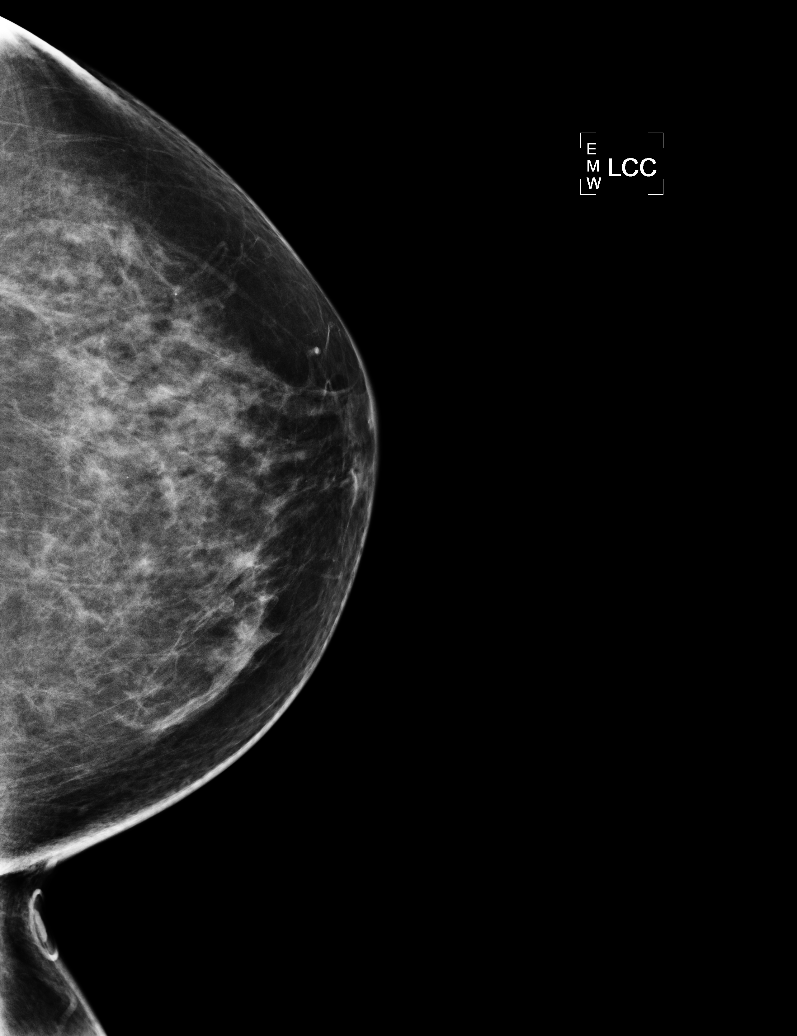

[L MLO (1 of 2)]
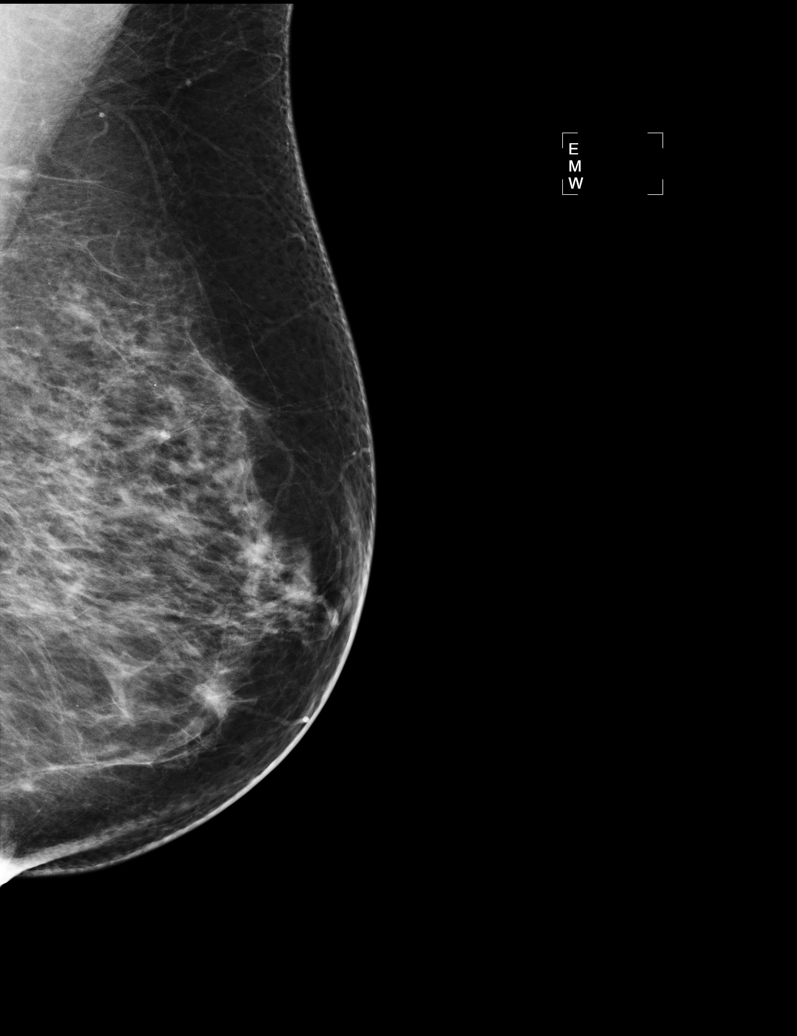

[R MLO]
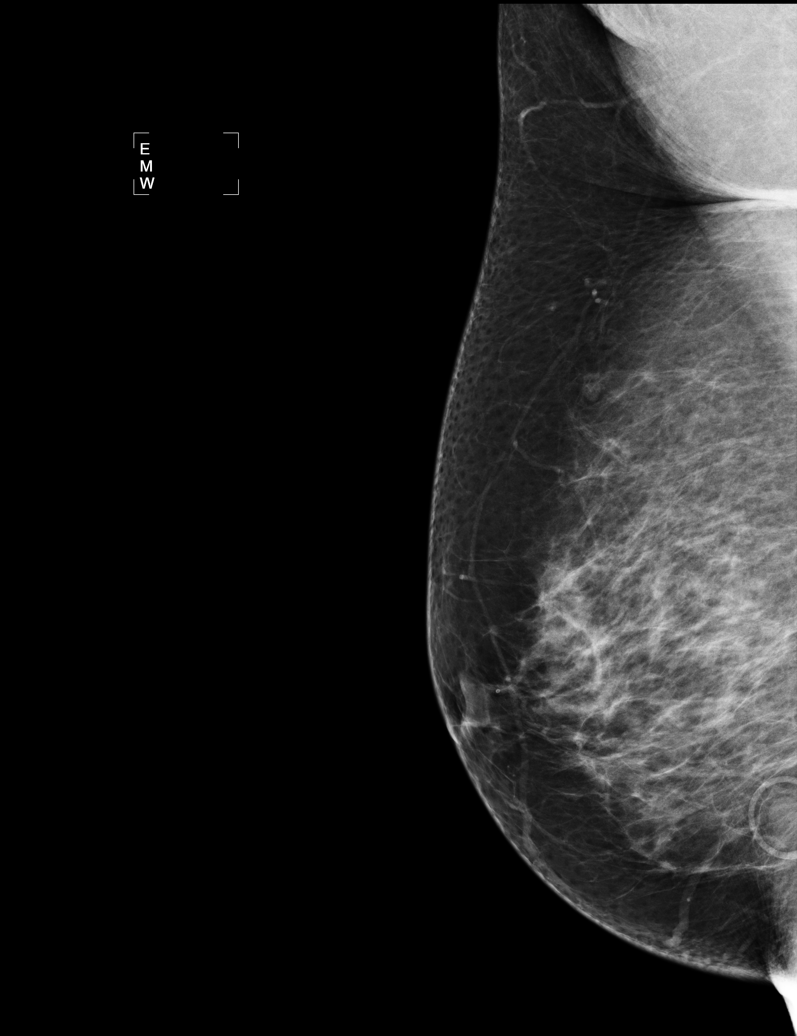

[L MLO (2 of 2)]
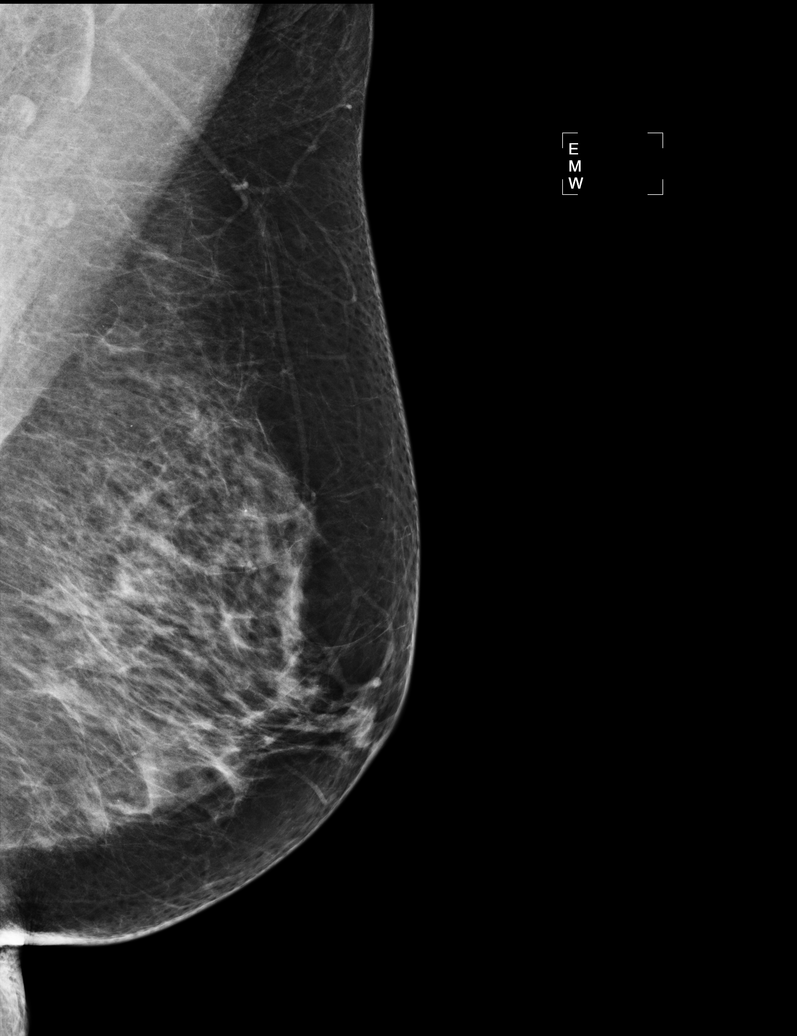

[5 of 5 positions shown; findings below may reference images not displayed]

ACR Breast Density Category c: The breasts are heterogeneously
dense, which may obscure small masses.
FINDINGS: There are no findings suspicious for malignancy. Images were
processed with CAD.
IMPRESSION: No mammographic evidence of malignancy. A result letter of this
screening mammogram will be mailed directly to the patient.

RECOMMENDATION:
Screening mammogram in one year. (Code:[id number])

BI-RADS CATEGORY  1: Negative

## 2013-05-19 ENCOUNTER — Telehealth: Payer: Self-pay

## 2013-05-19 NOTE — Telephone Encounter (Signed)
Can she take vitamin C with her medications she takes?

## 2013-05-19 NOTE — Telephone Encounter (Signed)
Ok for vit C with her meds.

## 2013-05-19 NOTE — Telephone Encounter (Signed)
Patient advised.

## 2013-06-13 ENCOUNTER — Other Ambulatory Visit: Payer: Self-pay | Admitting: Family Medicine

## 2013-06-22 ENCOUNTER — Telehealth: Payer: Self-pay | Admitting: *Deleted

## 2013-06-22 NOTE — Telephone Encounter (Signed)
Pt.notified

## 2013-06-22 NOTE — Telephone Encounter (Signed)
Yes can be used on the scalp

## 2013-06-22 NOTE — Telephone Encounter (Signed)
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  She would like to know if it is to be used on the scalp as well as she has been itching there too.  Please advise

## 2013-06-26 ENCOUNTER — Telehealth: Payer: Self-pay | Admitting: *Deleted

## 2013-06-26 NOTE — Telephone Encounter (Signed)
Pt lvm asking if she could use scabies tx for her head. I tried to call pt back and the phone rang and rang no vm.Loralee Pacas [place]

## 2013-07-28 ENCOUNTER — Telehealth: Payer: Self-pay | Admitting: *Deleted

## 2013-07-28 NOTE — Telephone Encounter (Signed)
Recommend Claritin 10 mg daily. Does get the plain Claritin without the decongestant. Storebrand as fine.

## 2013-07-28 NOTE — Telephone Encounter (Signed)
Tried to call pt & phone just kept ringing.  No vm.

## 2013-07-28 NOTE — Telephone Encounter (Signed)
Pt left message stating that she has been sneezing & itching more here lately & was wondering what a safe OTC allergy med would be to take with her current rx.

## 2013-08-25 ENCOUNTER — Ambulatory Visit (INDEPENDENT_AMBULATORY_CARE_PROVIDER_SITE_OTHER): Payer: Medicare HMO | Admitting: Physician Assistant

## 2013-08-25 ENCOUNTER — Encounter: Payer: Self-pay | Admitting: Physician Assistant

## 2013-08-25 VITALS — BP 144/69 | HR 98 | Wt 152.0 lb

## 2013-08-25 DIAGNOSIS — L0291 Cutaneous abscess, unspecified: Secondary | ICD-10-CM

## 2013-08-25 DIAGNOSIS — L739 Follicular disorder, unspecified: Secondary | ICD-10-CM

## 2013-08-25 DIAGNOSIS — L0231 Cutaneous abscess of buttock: Secondary | ICD-10-CM

## 2013-08-25 DIAGNOSIS — L03317 Cellulitis of buttock: Secondary | ICD-10-CM

## 2013-08-25 DIAGNOSIS — L738 Other specified follicular disorders: Secondary | ICD-10-CM

## 2013-08-25 MED ORDER — DOXYCYCLINE HYCLATE 100 MG PO CAPS: 100.0000 mg | ORAL_CAPSULE | Freq: Two times a day (BID) | ORAL | Status: DC

## 2013-08-25 NOTE — Progress Notes (Signed)
   Subjective:    Patient ID: Michelle Lawrence, female    DOB: 19-Apr-1946, 68 y.o.   MRN: [id number]  HPI Pt presents to the clinic with a painful bump on right buttocks for 3 days. She has had 2 other bumps on her vaginal area but they seem to be healing. She has been taking aleve for pain which is helping. Denies and fever, chills. Bump and redness seems to continue to grow. Hx of folliculitis. Not tried anything to make better.      Review of Systems     Objective:   Physical Exam  Genitourinary:     Skin:             Assessment & Plan:  Folliculitis/abscess- decided to I and D abscess on buttocks due to size and pain level. Will treat with Doxycycline for 10 days. Discussed changing soap to dial. Encouraged hibiclens monthly to wash with. Sent culture off. Continue aleve for pain. Call if not improving or worsening.   Incision and Drainage Procedure Note  Pre-operative Diagnosis: infected hair follicle with abscess  Post-operative Diagnosis: same  Indications: infection  Anesthesia: 2% plain lidocaine  Procedure Details  The procedure, risks and complications have been discussed in detail (including, but not limited to airway compromise, infection, bleeding) with the patient, and the patient has signed consent to the procedure.  The skin was sterilely prepped and draped over the affected area in the usual fashion. After adequate local anesthesia, I&D with a #11 blade was performed on the right, buttocks. Purulent drainage: present The patient was observed until stable.  Findings: Infected purulent abscess  EBL: scant   Drains: none  Condition: Tolerated procedure well   Complications: vasovagal episode. Drank water and stayed laying down and was fine in 5-10 minutes.

## 2013-08-25 NOTE — Patient Instructions (Signed)
Folliculitis  Folliculitis is redness, soreness, and swelling (inflammation) of the hair follicles. This condition can occur anywhere on the body. People with weakened immune systems, diabetes, or obesity have a greater risk of getting folliculitis. CAUSES  Bacterial infection. This is the most common cause.  Fungal infection.  Viral infection.  Contact with certain chemicals, especially oils and tars. Long-term folliculitis can result from bacteria that live in the nostrils. The bacteria may trigger multiple outbreaks of folliculitis over time. SYMPTOMS Folliculitis most commonly occurs on the scalp, thighs, legs, back, buttocks, and areas where hair is shaved frequently. An early sign of folliculitis is a small, white or yellow, pus-filled, itchy lesion (pustule). These lesions appear on a red, inflamed follicle. They are usually less than 0.2 inches (5 mm) wide. When there is an infection of the follicle that goes deeper, it becomes a boil or furuncle. A group of closely packed boils creates a larger lesion (carbuncle). Carbuncles tend to occur in hairy, sweaty areas of the body. DIAGNOSIS  Your caregiver can usually tell what is wrong by doing a physical exam. A sample may be taken from one of the lesions and tested in a lab. This can help determine what is causing your folliculitis. TREATMENT  Treatment may include:  Applying warm compresses to the affected areas.  Taking antibiotic medicines orally or applying them to the skin.  Draining the lesions if they contain a large amount of pus or fluid.  Laser hair removal for cases of long-lasting folliculitis. This helps to prevent regrowth of the hair. HOME CARE INSTRUCTIONS  Apply warm compresses to the affected areas as directed by your caregiver.  If antibiotics are prescribed, take them as directed. Finish them even if you start to feel better.  You may take over-the-counter medicines to relieve itching.  Do not shave  irritated skin.  Follow up with your caregiver as directed. SEEK IMMEDIATE MEDICAL CARE IF:   You have increasing redness, swelling, or pain in the affected area.  You have a fever. MAKE SURE YOU:  Understand these instructions.  Will watch your condition.  Will get help right away if you are not doing well or get worse. Document Released: 10/12/2001 Document Revised: 02/02/2012 Document Reviewed: 11/03/2011 [employer] Patient Information 2014 [employer].  

## 2013-08-28 ENCOUNTER — Other Ambulatory Visit: Payer: Self-pay | Admitting: Physician Assistant

## 2013-08-28 DIAGNOSIS — Z22322 Carrier or suspected carrier of Methicillin resistant Staphylococcus aureus: Secondary | ICD-10-CM | POA: Insufficient documentation

## 2013-08-28 LAB — WOUND CULTURE: GRAM STAIN: NONE SEEN

## 2013-08-28 MED ORDER — MUPIROCIN 2 % EX OINT: 1.0000 "application " | TOPICAL_OINTMENT | Freq: Two times a day (BID) | CUTANEOUS | Status: DC

## 2013-08-29 ENCOUNTER — Telehealth: Payer: Self-pay | Admitting: *Deleted

## 2013-08-29 NOTE — Telephone Encounter (Signed)
Pt states her BP this morning at 10:00am was 142/93.  She states her wound looks good.

## 2013-08-29 NOTE — Telephone Encounter (Signed)
Notified pt and sent to schedule a f/u for BP. Clemetine Marker, LPN

## 2013-08-29 NOTE — Telephone Encounter (Signed)
Schedule f/u for BP in next week or two.  Check BP daily so I can review these.

## 2013-08-29 NOTE — Telephone Encounter (Signed)
Pt calls and states that during the night her heart was racing and got hot.  Then when woke up this morning was really cold.  Said you decreased her BP med at last visit and is currently on antibibiotic for MRSA and wanted to know if this could cause that or does her BP med need to be increased.

## 2013-08-29 NOTE — Telephone Encounter (Signed)
Has she checked BP? Could have been a fever. If happens again then check temp.  Sometimes antibitoics can make you feel bad. Is the wound looking better?

## 2013-09-07 ENCOUNTER — Ambulatory Visit: Payer: Medicare HMO | Admitting: Family Medicine

## 2013-10-06 ENCOUNTER — Other Ambulatory Visit: Payer: Self-pay | Admitting: *Deleted

## 2013-10-06 MED ORDER — BENAZEPRIL-HYDROCHLOROTHIAZIDE 20-12.5 MG PO TABS: ORAL_TABLET | ORAL | Status: DC

## 2013-11-03 ENCOUNTER — Encounter: Payer: Self-pay | Admitting: Family Medicine

## 2013-11-03 ENCOUNTER — Ambulatory Visit (INDEPENDENT_AMBULATORY_CARE_PROVIDER_SITE_OTHER): Payer: Medicare HMO | Admitting: Family Medicine

## 2013-11-03 VITALS — BP 116/59 | HR 59 | Wt 152.0 lb

## 2013-11-03 DIAGNOSIS — F329 Major depressive disorder, single episode, unspecified: Secondary | ICD-10-CM

## 2013-11-03 DIAGNOSIS — N951 Menopausal and female climacteric states: Secondary | ICD-10-CM

## 2013-11-03 DIAGNOSIS — E785 Hyperlipidemia, unspecified: Secondary | ICD-10-CM

## 2013-11-03 DIAGNOSIS — F320 Major depressive disorder, single episode, mild: Secondary | ICD-10-CM

## 2013-11-03 DIAGNOSIS — F32A Depression, unspecified: Secondary | ICD-10-CM

## 2013-11-03 DIAGNOSIS — Z780 Asymptomatic menopausal state: Secondary | ICD-10-CM

## 2013-11-03 DIAGNOSIS — I10 Essential (primary) hypertension: Secondary | ICD-10-CM

## 2013-11-03 DIAGNOSIS — F3289 Other specified depressive episodes: Secondary | ICD-10-CM

## 2013-11-03 MED ORDER — FLUOXETINE HCL 10 MG PO CAPS: 10.0000 mg | ORAL_CAPSULE | Freq: Every day | ORAL | Status: DC

## 2013-11-03 MED ORDER — BENAZEPRIL HCL 20 MG PO TABS: 20.0000 mg | ORAL_TABLET | Freq: Every day | ORAL | Status: DC

## 2013-11-03 NOTE — Progress Notes (Signed)
   Subjective:    Patient ID: Michelle Lawrence, female    DOB: 01-20-1946, 68 y.o.   MRN: [id number]  HPI Hypertension- Pt denies chest pain, SOB, dizziness, or heart palpitations.  Taking meds as directed w/o problems.  Denies medication side effects.    Hyperlipidemia-currently on Crestor 20 mg, but she's been cutting it in half for the last 6 months. She would like to recheck her lipids to see if her levels are adequate on the 10 mg dose.  Reports that she still feels a little mildly depressed. We had previously tried sertraline. She took it for about 3-4 days because it caused chest pain. She says the chest pain resolved as soon as she stopped taking the medication. She wonders if she still might benefit from taking something. She has not tried any other medications in the past. She denies having suicidal thoughts.  Review of Systems     Objective:   Physical Exam  Constitutional: She is oriented to person, place, and time. She appears well-developed and well-nourished.  HENT:  Head: Normocephalic and atraumatic.  Cardiovascular: Normal rate, regular rhythm and normal heart sounds.   Pulmonary/Chest: Effort normal and breath sounds normal.  Neurological: She is alert and oriented to person, place, and time.  Skin: Skin is warm and dry.  Psychiatric: She has a normal mood and affect. Her behavior is normal.          Assessment & Plan:I think it to a  hypertension-well-controlled it looks fantastic today.  Will stop the hctz and follow up in 6 month.  Due for CMP. She also requests to have a CBC done today.  Hyperlipidemia-. Looked good  In septebmer.Has been cutting crestor in half and wants to recheck her labwork.    Also due for bone density testing. Last tetanus was 3 years ago will place order today.  Also discussed need for shingles vaccine. Handout provided. If she's interested just calls back and we can get her a prescription for local pharmacy.  Mild  depression-discussed starting fluoxetine. We'll start with a very low dose, 10 mg. Typically will need to get at least 20-40 mg to be therapeutic as an adult, and I explained that her but she wants to stick with a 10 mg dose until I see her back in 6 weeks. PHQ 9 score of 5 today.

## 2013-11-06 LAB — CBC
HCT: 37.7 % (ref 36.0–46.0)
Hemoglobin: 12.9 g/dL (ref 12.0–15.0)
MCH: 30.4 pg (ref 26.0–34.0)
MCHC: 34.2 g/dL (ref 30.0–36.0)
MCV: 88.9 fL (ref 78.0–100.0)
PLATELETS: 239 10*3/uL (ref 150–400)
RBC: 4.24 MIL/uL (ref 3.87–5.11)
RDW: 13 % (ref 11.5–15.5)
WBC: 4.8 10*3/uL (ref 4.0–10.5)

## 2013-11-06 LAB — LIPID PANEL
CHOLESTEROL: 141 mg/dL (ref 0–200)
HDL: 48 mg/dL (ref >39)
LDL CALC: 74 mg/dL (ref 0–99)
TRIGLYCERIDES: 95 mg/dL (ref <150)
Total CHOL/HDL Ratio: 2.9 Ratio
VLDL: 19 mg/dL (ref 0–40)

## 2013-11-06 LAB — BASIC METABOLIC PANEL WITH GFR
BUN: 17 mg/dL (ref 6–23)
CO2: 29 meq/L (ref 19–32)
CREATININE: 0.51 mg/dL (ref 0.50–1.10)
Calcium: 9.5 mg/dL (ref 8.4–10.5)
Chloride: 106 mEq/L (ref 96–112)
GFR, Est African American: >89 mL/min
GFR, Est Non African American: >89 mL/min
Glucose, Bld: 88 mg/dL (ref 70–99)
Potassium: 4.5 mEq/L (ref 3.5–5.3)
Sodium: 142 mEq/L (ref 135–145)

## 2013-11-06 NOTE — Progress Notes (Signed)
Quick Note:  All labs are normal. ______ 

## 2013-11-07 ENCOUNTER — Ambulatory Visit (INDEPENDENT_AMBULATORY_CARE_PROVIDER_SITE_OTHER): Payer: Medicare HMO

## 2013-11-07 DIAGNOSIS — Z780 Asymptomatic menopausal state: Secondary | ICD-10-CM

## 2013-11-07 DIAGNOSIS — M899 Disorder of bone, unspecified: Secondary | ICD-10-CM

## 2013-11-07 DIAGNOSIS — M949 Disorder of cartilage, unspecified: Secondary | ICD-10-CM

## 2013-11-20 ENCOUNTER — Telehealth: Payer: Self-pay | Admitting: *Deleted

## 2013-11-20 NOTE — Telephone Encounter (Signed)
Pt called and stated that prozac is not working and would like to discontinue it. Please advise.Michelle Lawrence [place]

## 2013-11-20 NOTE — Telephone Encounter (Signed)
She hasn't been on it long enough to tell if working or not. Needs to be on it for 6 week total and as I told her during office visit it may not work well unless willing to try 20 mg or even up to 40mg .  Why doesn't she try to increase to 40mg  and keep her f/u appt.

## 2013-11-21 NOTE — Telephone Encounter (Signed)
Called pt and informed her of recommendations she stated that she just doesn't feel right and feels that she will be ok not taking it.Michelle Lawrence

## 2013-12-04 ENCOUNTER — Telehealth: Payer: Self-pay | Admitting: *Deleted

## 2013-12-04 NOTE — Telephone Encounter (Signed)
Patient calls and states that she has a boil that has came up on the right side below her buttock and it is small right now and wanted to know if she could use the  Nuperocyn? Ointment you gave her to put in her nose.... She works at a nursing home.  Please advise

## 2013-12-04 NOTE — Telephone Encounter (Signed)
Pt called back and I informed her of Dr. Gardiner Ramus recommendations. She voiced understanding and agreed.Teddy Spike

## 2013-12-04 NOTE — Telephone Encounter (Signed)
Yes, ok to use the mupricin. If gets larger needs to come in to have it lanced.

## 2013-12-15 ENCOUNTER — Ambulatory Visit: Payer: Medicare HMO | Admitting: Family Medicine

## 2013-12-22 ENCOUNTER — Telehealth: Payer: Self-pay | Admitting: *Deleted

## 2013-12-22 NOTE — Telephone Encounter (Signed)
Returned pt's call she wanted to know if it would be ok to take one a day for seniors with her current meds. I told her that it would be fine and to d/c b12.Michelle Lawrence

## 2013-12-30 ENCOUNTER — Other Ambulatory Visit: Payer: Self-pay | Admitting: Family Medicine

## 2014-01-01 ENCOUNTER — Other Ambulatory Visit: Payer: Self-pay

## 2014-01-01 MED ORDER — ROSUVASTATIN CALCIUM 10 MG PO TABS
10.0000 mg | ORAL_TABLET | Freq: Every day | ORAL | Status: DC
Start: 2014-01-01 — End: 2014-07-09

## 2014-01-05 ENCOUNTER — Ambulatory Visit (INDEPENDENT_AMBULATORY_CARE_PROVIDER_SITE_OTHER): Payer: Medicare HMO | Admitting: Family Medicine

## 2014-01-05 ENCOUNTER — Encounter: Payer: Self-pay | Admitting: Family Medicine

## 2014-01-05 VITALS — BP 115/64 | HR 61 | Ht 63.6 in | Wt 154.0 lb

## 2014-01-05 DIAGNOSIS — F329 Major depressive disorder, single episode, unspecified: Secondary | ICD-10-CM

## 2014-01-05 DIAGNOSIS — F32A Depression, unspecified: Secondary | ICD-10-CM

## 2014-01-05 DIAGNOSIS — F3289 Other specified depressive episodes: Secondary | ICD-10-CM

## 2014-01-05 DIAGNOSIS — I10 Essential (primary) hypertension: Secondary | ICD-10-CM

## 2014-01-05 DIAGNOSIS — F320 Major depressive disorder, single episode, mild: Secondary | ICD-10-CM

## 2014-01-05 NOTE — Progress Notes (Signed)
   Subjective:    Patient ID: Michelle Lawrence, female    DOB: October 02, 1945, 68 y.o.   MRN: [id number]  HPI Hypertension- Pt denies chest pain, SOB, dizziness, or heart palpitations.  Taking meds as directed w/o problems.  Denies medication side effects.  We stopped her hctz a month ago.    At last office visit we discussed her mild depression. I suggested starting fluoxetine. We started her with 10 mg I encouraged her to increase to 20 as she went to stay with 10 mg until off on her back. She called in the interim to let us know that she was feeling weird on it. She was not descriptive and what symptoms she was having specifically. I encouraged her to try to stick with it until her followup appointment but she decided to discontinue it. She says it made her feel sleepy. Still complains of little interest or pleasure in doing things and feeling down several days of the week. Still feels tired and has low energy. Less difficulty concentrating several days a week. No thoughts of harming herself.  Review of Systems     Objective:   Physical Exam  Constitutional: She is oriented to person, place, and time. She appears well-developed and well-nourished.  HENT:  Head: Normocephalic and atraumatic.  Cardiovascular: Normal rate, regular rhythm and normal heart sounds.   Pulmonary/Chest: Effort normal and breath sounds normal.  Neurological: She is alert and oriented to person, place, and time.  Skin: Skin is warm and dry.  Psychiatric: She has a normal mood and affect. Her behavior is normal.          Assessment & Plan:  HTN- Well controlled off the HCTZ.  Looks great.  Will cut the benazpril in half. She will keep an eye on this at home. If her pressures elevated over 562 systolic she will call and let me know. Otherwise I will see her back in 6 months.  Mild depression - PHQ- 9 score of 7 (mild). We discussed options.  She really didn't give the fluoxetine but a week and she says it made her  tired. She did try to move it to bedtime. Offered to change medication. She wants to think about it. She can call me if she would like to try something else. Reduce her new medication and she will need followup one month after starting it.

## 2014-01-12 ENCOUNTER — Telehealth: Payer: Self-pay | Admitting: *Deleted

## 2014-01-12 ENCOUNTER — Telehealth: Payer: Self-pay

## 2014-01-12 NOTE — Telephone Encounter (Signed)
Michelle Lawrence complains of chest congestion with yellow sputum for a couple weeks. What OTC medication can she take for this. Denies any other cold symptoms.

## 2014-01-12 NOTE — Telephone Encounter (Signed)
Mucinex D 2 tablets twice a day. Drink plenty of water. Follow up on Monday if not improving.

## 2014-01-12 NOTE — Telephone Encounter (Signed)
Patient advised.

## 2014-01-12 NOTE — Telephone Encounter (Signed)
Pt called and stated that she is having a lot of congestion. I suggested that she try either mucinex or delsym and to stay hydrated. She stated that she was just going to let it just run its course. Pt informed to call and schedule an appt if she is not getting any better.Teddy Spike

## 2014-02-19 ENCOUNTER — Telehealth: Payer: Self-pay | Admitting: *Deleted

## 2014-02-19 DIAGNOSIS — R748 Abnormal levels of other serum enzymes: Secondary | ICD-10-CM

## 2014-02-19 NOTE — Telephone Encounter (Signed)
We need to check her B12 level before I can tell if what she is taking it too much.  Her levels were too high when checked overe a year ago and she was supposed to have it rechecked.  Hold B12 for 2 days adn then can go to lab to have it checked.

## 2014-02-19 NOTE — Telephone Encounter (Signed)
Pt called and wanted to know if she is taking to much b12. She takes 500 mcg of b12 every other day and a multivitamin daily that has 25 mcg of b12 in it wanted to know if this is too much for her to be taking?

## 2014-02-20 NOTE — Telephone Encounter (Signed)
Pt informed.Michelle Lawrence [name]  

## 2014-03-09 ENCOUNTER — Telehealth: Payer: Self-pay | Admitting: *Deleted

## 2014-03-09 NOTE — Telephone Encounter (Signed)
Pt called and wanted to know if she can take Estroven for Hot flashes, and rest. She has had a complete hysterectomy. Please advise.Michelle Lawrence [place]

## 2014-03-09 NOTE — Telephone Encounter (Signed)
Yes, ok to take [employer]

## 2014-03-12 NOTE — Telephone Encounter (Signed)
Mrs. Barraclough informed with understanding./ Estil Daft

## 2014-04-16 ENCOUNTER — Other Ambulatory Visit: Payer: Self-pay | Admitting: Family Medicine

## 2014-04-16 DIAGNOSIS — Z139 Encounter for screening, unspecified: Secondary | ICD-10-CM

## 2014-04-19 ENCOUNTER — Ambulatory Visit: Payer: Medicare HMO

## 2014-05-03 ENCOUNTER — Ambulatory Visit (INDEPENDENT_AMBULATORY_CARE_PROVIDER_SITE_OTHER): Payer: Medicare HMO | Admitting: Family Medicine

## 2014-05-03 VITALS — Temp 98.2°F

## 2014-05-03 DIAGNOSIS — Z23 Encounter for immunization: Secondary | ICD-10-CM

## 2014-05-03 NOTE — Progress Notes (Signed)
   Subjective:    Patient ID: Michelle Lawrence, female    DOB: Oct 14, 1945, 68 y.o.   MRN: [id number]  HPI Flu shot administered without complication. Margette Fast, CMA    Review of Systems     Objective:   Physical Exam        Assessment & Plan:

## 2014-05-14 ENCOUNTER — Ambulatory Visit: Payer: Medicare HMO | Admitting: Family Medicine

## 2014-05-15 ENCOUNTER — Telehealth: Payer: Self-pay

## 2014-05-15 NOTE — Telephone Encounter (Signed)
Michelle Lawrence reports having had Hep B vaccine series in 2007-2008. She states her work is giving the Hep B vaccine out for free and she wanted to know if she should have another injection. I advised that she probably did not need another Hep B vaccine since she has already had the series.

## 2014-05-15 NOTE — Telephone Encounter (Signed)
She really does not need to repeat the series if she completed the full series back in 2007. This certainly could do titers to see if she has developed immunity after the vaccination series if she would like.

## 2014-05-16 NOTE — Telephone Encounter (Signed)
Patient advised.

## 2014-05-21 ENCOUNTER — Ambulatory Visit (INDEPENDENT_AMBULATORY_CARE_PROVIDER_SITE_OTHER): Payer: Medicare HMO

## 2014-05-21 ENCOUNTER — Encounter: Payer: Self-pay | Admitting: Physician Assistant

## 2014-05-21 ENCOUNTER — Ambulatory Visit (INDEPENDENT_AMBULATORY_CARE_PROVIDER_SITE_OTHER): Payer: Medicare HMO | Admitting: Physician Assistant

## 2014-05-21 VITALS — BP 142/75 | HR 81 | Wt 155.0 lb

## 2014-05-21 DIAGNOSIS — M25562 Pain in left knee: Secondary | ICD-10-CM

## 2014-05-21 NOTE — Patient Instructions (Addendum)
Medial Collateral Knee Ligament Sprain  with Phase I Rehab The medial collateral ligament (MCL) of the knee helps hold the knee joint in proper alignment and prevents the bones from shifting out of alignment (displacing) to the inside (medially). Injury to the knee may cause a tear in the MCL ligament (sprain). Sprains may heal without treatment, but this often results in a loose joint. Sprains are classified into three categories. Grade 1 sprains cause pain, but the tendon is not lengthened. Grade 2 sprains include a lengthened ligament, due to the ligament being stretched or partially ruptured. With grade 2 sprains, there is still function, although possibly decreased. Grade 3 sprains involve a complete tear of the tendon or muscle, and function is usually impaired. SYMPTOMS   Pain and tenderness on the inner side of the knee.  A "pop," tearing or pulling sensation at the time of injury.  Bruising (contusion) at the site of injury, within 48 hours of injury.  Knee stiffness.  Limping, often walking with the knee bent. CAUSES  An MCL sprain occurs when a force is placed on the ligament that is greater than it can handle. Common mechanisms of injury include:  Direct hit (trauma) to the outer side of the knee, especially if the foot is planted on the ground.  Forceful pivoting of the body and leg, while the foot is planted on the ground. RISK INCREASES WITH:  Contact sports (football, rugby).  Sports that require pivoting or cutting (soccer).  Poor knee strength and flexibility.  Improper equipment use. PREVENTION  Warm up and stretch properly before activity.  Maintain physical fitness:  Strength, flexibility and endurance.  Cardiovascular fitness.  Wear properly fitted protective equipment (correct length of cleats for surface).  Functional braces may be effective in preventing injury. PROGNOSIS  MCL tears usually heal without the need for surgery. Sometimes however,  surgery is required. RELATED COMPLICATIONS  Frequently recurring symptoms, such as the knee giving way, knee instability or knee swelling.  Injury to other structures in the knee joint:  Meniscal cartilage, resulting in locking and swelling of the knee.  Articular cartilage, resulting in knee arthritis.  Other ligaments of the knee.  Injury to nerves, resulting in numbness of the outer leg, foot or ankle and weakness or paralysis, with inability to raise the ankle or toes.  Knee stiffness. TREATMENT Treatment first involves the use of ice and medicine, to reduce pain and inflammation. The use of strengthening and stretching exercises may help reduce pain with activity. These exercises may be performed at home, but referral to a therapist is often advised. You may be advised to walk with crutches until you are able to walk without a limp. Your caregiver may provide you with a hinged knee brace to help regain a full range of motion, while also protecting the injured knee. For severe MCL injuries or injuries that involve other ligaments of the knee, surgery is often advised. MEDICATION  Do not take pain medicine for 7 days before surgery.  Only use over-the-counter pain medicine as directed by your caregiver.  Only use prescription pain relievers as directed and only in needed amounts. HEAT AND COLD  Cold treatment (icing) should be applied for 10 to 15 minutes every 2 to 3 hours for inflammation and pain, and immediately after any activity, that aggravates the symptoms. Use ice packs or an ice massage.  Heat treatment may be used before performing stretching and strengthening activities prescribed by your caregiver, physical therapist or athletic trainer.   Use a heat pack or warm water soak. SEEK MEDICAL CARE IF:   Symptoms get worse or do not improve in 4 to 6 weeks, despite treatment.  New, unexplained symptoms develop. EXERCISES  PHASE I EXERCISES  RANGE OF MOTION (ROM) AND  STRETCHING EXERCISES-Medial Collateral Knee Ligament Sprain Phase I These are some of the initial exercises that your physician, physical therapist or athletic trainer may have you perform to begin your rehabilitation. When you demonstrate gains in your flexibility and strength, your caregiver may progress you to Phase II exercises. As you perform these exercises, remember:  These initial exercises are intended to be gentle. They will help you restore motion without increasing any swelling.  Completing these exercises allows less painful movement and prepares you for the more aggressive strengthening exercises in Phase II.  An effective stretch should be held for at least 30 seconds.  A stretch should never be painful. You should only feel a gentle lengthening or release in the stretched tissue. RANGE OF MOTION-Knee Flexion, Active  Lie on your back with both knees straight. (If this causes back discomfort, bend your healthy knee, placing your foot flat on the floor.)  Slowly slide your heel back toward your buttocks until you feel a gentle stretch in the front of your knee or thigh.  Hold for __________ seconds. Slowly slide your heel back to the starting position. Repeat __________ times. Complete this exercise __________ times per day. STRETCH-Knee Flexion, Supine  Lie on the floor with your right / left heel and foot lightly touching the wall. (Place both feet on the wall if you do not use a door frame.)  Without using any effort, allow gravity to slide your foot down the wall slowly until you feel a gentle stretch in the front of your right / left knee.  Hold this stretch for __________ seconds. Then return the leg to the starting position, using your health leg for help, if needed. Repeat __________ times. Complete this stretch __________ times per day. RANGE OF MOTION-Knee Flexion and Extension, Active-Assisted  Sit on the edge of a table or chair with your thighs firmly supported.  It may be helpful to place a folded towel under the end of your right / left thigh.  Flexion (bending): Place the ankle of your healthy leg on top of the other ankle. Use your healthy leg to gently bend your right / left knee until you feel a mild tension across the top of your knee.  Hold for __________ seconds.  Extension (straightening): Switch your ankles so your right / left leg is on top. Use your healthy leg to straighten your right / left knee until you feel a mild tension on the backside of your knee.  Hold for __________ seconds. Repeat __________ times. Complete this exercise __________ times per day. STRETCH-Knee Extension Sitting  Sit with your right / left leg/heel propped on another chair, coffee table, or foot stool.  Allow your leg muscles to relax, letting gravity straighten out your knee.*  You should feel a stretch behind your right / left knee. Hold this position for __________ seconds. Repeat __________ times. Complete this stretch __________ times per day. *Your physician, physical therapist or athletic trainer may instruct you to place a __________ weight on your thigh, just above your kneecap, to deepen the stretch. STRENGTHENING EXERCISES-Medial Collateral Knee Ligament Sprain Phase I These exercises may help you when beginning to rehabilitate your injury. They may resolve your symptoms with or without further involvement   from your physician, physical therapist or athletic trainer. While completing these exercises, remember:   In order to return to more demanding activities, you will likely need to progress to more challenging exercises. Your physician, physical therapist or athletic trainer will advance your exercises when your tissues show adequate healing and your muscles demonstrate increased strength.  Muscles can gain both the endurance and the strength needed for everyday activities through controlled exercises.  Complete these exercises as instructed by  your physician, physical therapist or athletic trainer. Increase the resistance and repetitions only as guided by your caregiver. STRENGTH-Quadriceps, Isometrics  Lie on your back with your right / left leg extended and your opposite knee bent.  Gradually tense the muscles in the front of your right / left thigh. You should see either your kneecap slide up toward your hip or an increased dimpling just above the knee. This motion will push the back of the knee down toward the floor, mat or bed on which you are lying.  Hold the muscle as tight as you can without increasing your pain for __________ seconds.  Relax the muscles slowly and completely in between each repetition. Repeat __________ times. Complete this exercise __________ times per day. STRENGTH-Quadriceps, Short Arcs  Lie on your back. Place a __________ inch towel roll under your knee so that the knee slightly bends.  Raise only your lower leg by tightening the muscles in the front of your thigh. Do not allow your thigh to rise.  Hold this position for __________ seconds. Repeat __________ times. Complete this exercise __________ times per day. OPTIONAL ANKLE WEIGHTS: Begin with ____________________, but DO NOT exceed ____________________. Increase in 1 pound/0.5 kilogram increments.  STRENGTH--Quadriceps, Straight Leg Raises Quality counts! Watch for signs that the quadriceps muscle is working, to be sure you are strengthening the correct muscles and not "cheating" by substituting with healthier muscles.  Lay on your back with your right / left leg extended and your opposite knee bent.  Tense the muscles in the front of your right / left thigh. You should see either your knee cap slide up or increased dimpling just above the knee. Your thigh may even shake a bit.  Tighten these muscles even more and raise your leg 4 to 6 inches off the floor. Hold for __________ seconds.  Keeping these muscles tense, lower your leg.  Relax  the muscles slowly and completely in between each repetition. Repeat __________ times. Complete this exercise __________ times per day. STRENGTH-Hamstring, Isometrics  Lie on your back on a firm surface.  Bend your right / left knee approximately __________ degrees.  Dig your heel into the surface as if you are trying to pull it toward your buttocks. Tighten the muscles in the back of your thighs to "dig" as hard as you can, without increasing any pain.  Hold this position for __________ seconds.  Release the tension gradually and allow your muscle to completely relax for __________ seconds in between each exercise. Repeat __________ times. Complete this exercise __________ times per day. STRENGTH-Hamstring, Curls  Lay on your stomach with your legs extended. (If you lay on a bed, your feet may hang over the edge.)  Tighten the muscles in the back of your thigh to bend your right / left knee up to 90 degrees. Keep your hips flat on the bed.  Hold this position for __________ seconds.  Slowly lower your leg back to the starting position. Repeat __________ times. Complete this exercise __________ times per day.   OPTIONAL ANKLE WEIGHTS: Begin with ____________________, but DO NOT exceed ____________________. Increase in 1 pound/0.5 kilogram increments.  Document Released: 08/03/2005 Document Revised: 10/26/2011 Document Reviewed: 11/15/2008 [facility] Patient Information 2015 [place], [place]. This information is not intended to replace advice given to you by your health care provider. Make sure you discuss any questions you have with your health care provider.   Ibuprofen 400-600mg  twice a day for 2 weeks.  Consider sleeve or ace wrap for support.

## 2014-05-21 NOTE — Progress Notes (Signed)
   Subjective:    Patient ID: Michelle Lawrence, female    DOB: [birth year], 68 y.o.   MRN: [id number]  HPI Pt presents to the clinic with left medial knee pain over femoral condyle for last 3-4 weeks. Not constant just off and on. Worse with twisting maneuver. Knee has not given way or any known trauma. 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 Not taking anything by mouth. Not iced. Describes pain and "twinges" of pain off and on.     Review of Systems  All other systems reviewed and are negative.      Objective:   Physical Exam  Constitutional: She appears well-developed and well-nourished.  Musculoskeletal:  Pain at insertion of MCL to femoral condyle. No swelling, redness, bruising.  No joint tenderness bilaterally.  No pain of patella.  Anterior drawer negative.  mcmurrays negative.  Strength of lower left leg 5/5.           Assessment & Plan:  Left medial knee pain/MCL strain- pain seems to be consist with MCL attachment to femoral condyle. There could be some strain and inflammation. Will get xray today. Offered hinge brace but pt concerned with cost. Discussed over the counter sleeve brace for stability of left knee. Pain does not sound or present like meniscal tear. Discussed proper lifting techniques. Encouraged icing 44min up to 4 times a day. Encouraged addition of ibuprofen twice daily for next 3-5 days. Ice knee. Gave some stretches to strengthen MCL. If not improving follow up or call and consider other treatment or imaging.

## 2014-05-22 ENCOUNTER — Ambulatory Visit: Payer: Medicare HMO | Admitting: Family Medicine

## 2014-05-24 ENCOUNTER — Ambulatory Visit: Payer: Medicare HMO

## 2014-06-04 ENCOUNTER — Other Ambulatory Visit: Payer: Self-pay | Admitting: Family Medicine

## 2014-06-14 ENCOUNTER — Ambulatory Visit (INDEPENDENT_AMBULATORY_CARE_PROVIDER_SITE_OTHER): Payer: Medicare HMO

## 2014-06-14 DIAGNOSIS — Z139 Encounter for screening, unspecified: Secondary | ICD-10-CM

## 2014-06-14 DIAGNOSIS — Z1231 Encounter for screening mammogram for malignant neoplasm of breast: Secondary | ICD-10-CM

## 2014-06-14 IMAGING — MG MM DIGITAL SCREENING
5 series · 5 of 5 positions shown · non-contrast
Comparison: Previous exam(s).

CLINICAL DATA: Screening.

EXAM:
DIGITAL SCREENING BILATERAL MAMMOGRAM WITH CAD

[R CC]
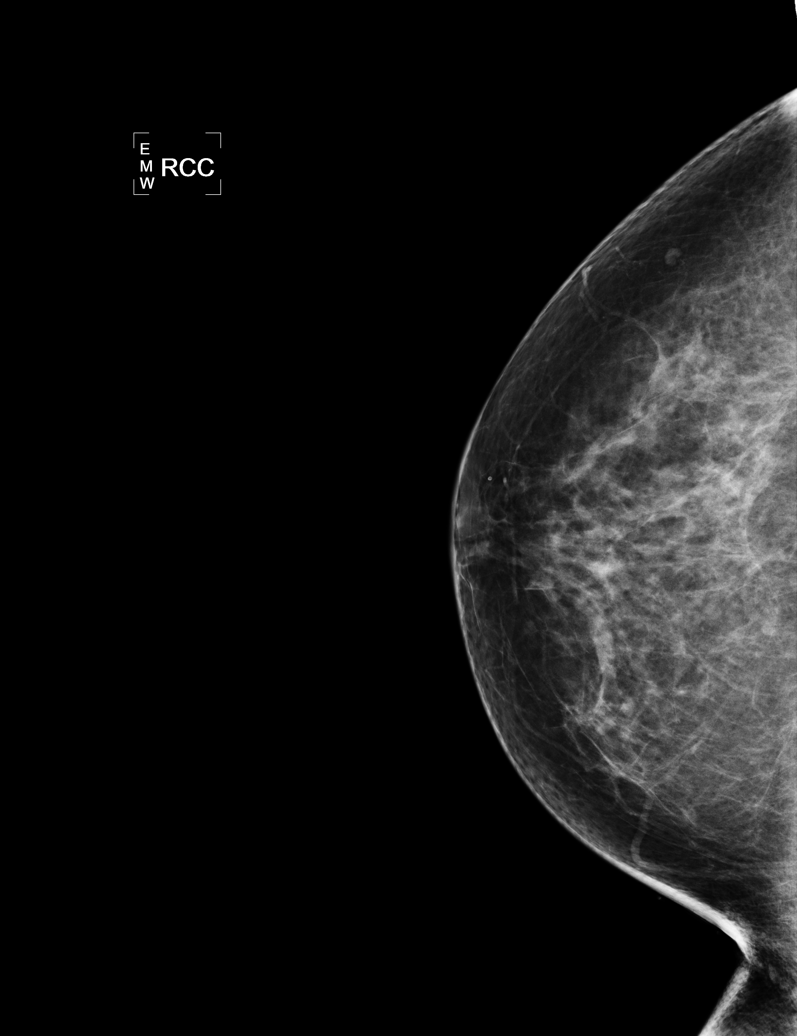

[L CC (1 of 2)]
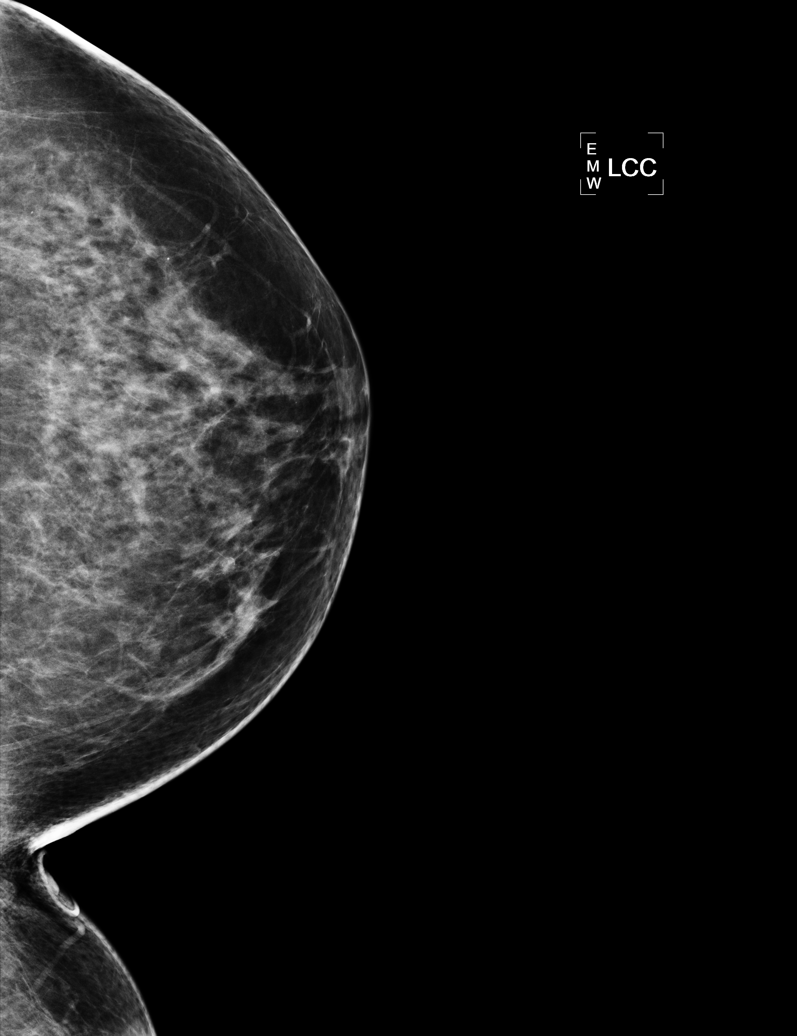

[L MLO]
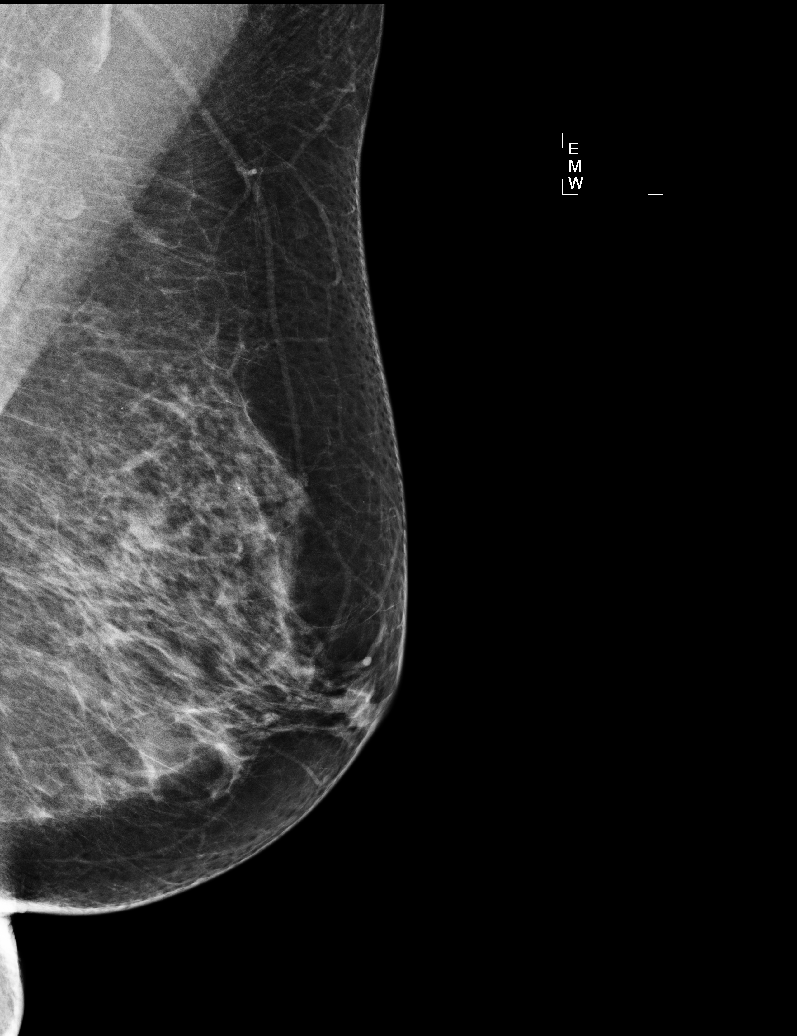

[R MLO]
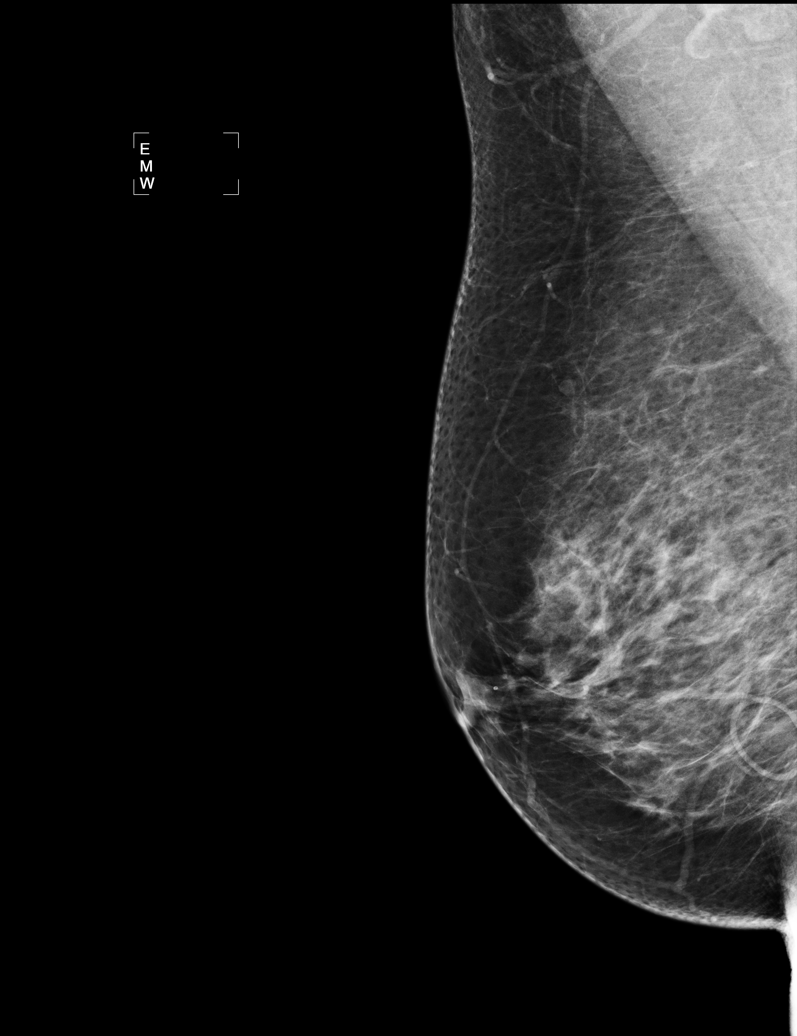

[L CC (2 of 2)]
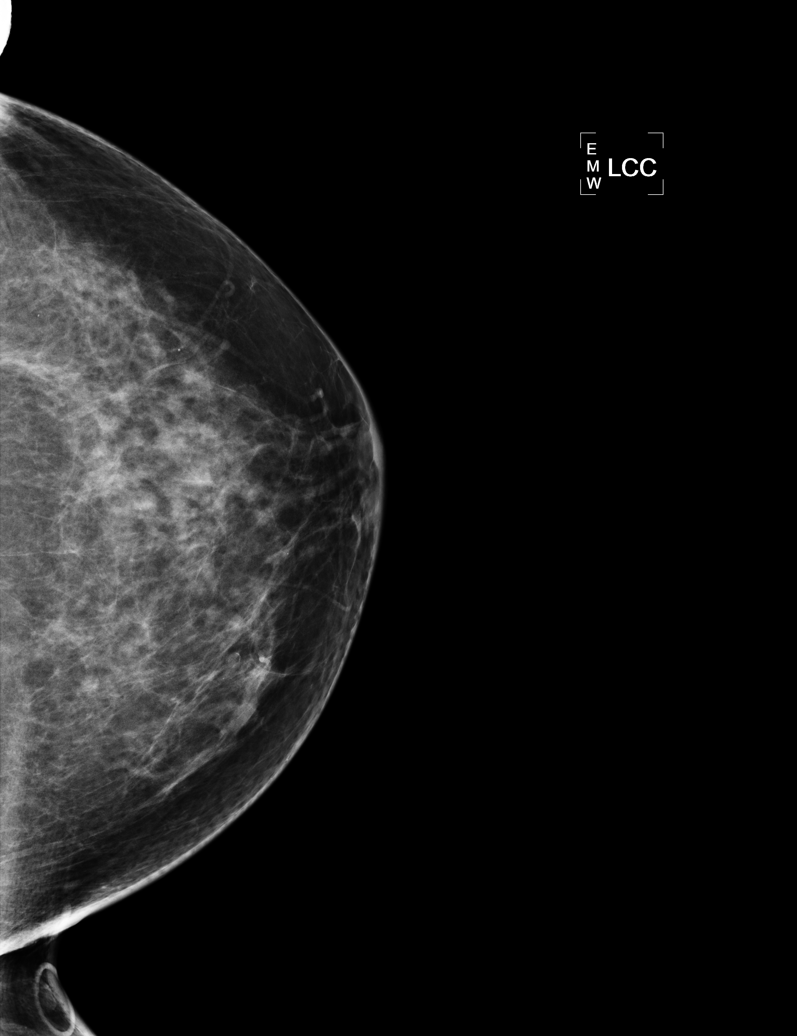

[5 of 5 positions shown; findings below may reference images not displayed]

ACR Breast Density Category b: There are scattered areas of
fibroglandular density.
FINDINGS: There are no findings suspicious for malignancy. Images were
processed with CAD.
IMPRESSION: No mammographic evidence of malignancy. A result letter of this
screening mammogram will be mailed directly to the patient.

RECOMMENDATION:
Screening mammogram in one year. (Code:AS-G-LCT)

BI-RADS CATEGORY  1: Negative.

## 2014-07-09 ENCOUNTER — Encounter: Payer: Self-pay | Admitting: Family Medicine

## 2014-07-09 ENCOUNTER — Ambulatory Visit (INDEPENDENT_AMBULATORY_CARE_PROVIDER_SITE_OTHER): Payer: Medicare HMO | Admitting: Family Medicine

## 2014-07-09 VITALS — BP 129/73 | HR 81 | Temp 98.0°F | Ht 63.5 in | Wt 155.0 lb

## 2014-07-09 DIAGNOSIS — E7800 Pure hypercholesterolemia, unspecified: Secondary | ICD-10-CM

## 2014-07-09 DIAGNOSIS — M25562 Pain in left knee: Secondary | ICD-10-CM

## 2014-07-09 DIAGNOSIS — I10 Essential (primary) hypertension: Secondary | ICD-10-CM

## 2014-07-09 MED ORDER — BENAZEPRIL HCL 10 MG PO TABS: 10.0000 mg | ORAL_TABLET | Freq: Every day | ORAL | Status: DC

## 2014-07-09 MED ORDER — BENAZEPRIL HCL 20 MG PO TABS: 10.0000 mg | ORAL_TABLET | Freq: Every day | ORAL | Status: DC

## 2014-07-09 MED ORDER — ROSUVASTATIN CALCIUM 20 MG PO TABS: 20.0000 mg | ORAL_TABLET | Freq: Every day | ORAL | Status: DC

## 2014-07-09 NOTE — Progress Notes (Signed)
   Subjective:    Patient ID: Michelle Lawrence, female    DOB: 11/27/1945, 68 y.o.   MRN: [id number]  HPI Hypertension- Pt denies chest pain, SOB, dizziness, or heart palpitations.  Taking meds as directed w/o problems.  Denies medication side effects.    Hyperlipidemia - we reduced crestor to 10mg  7 months ago. She has been tolerating this well and wants to know what her cholesterol is doing on the lower dose.   Left knee pain - Pain x 2 months she one of my partners for this. She says she has been doing stretches but not consistently. It still feels sore with activities. She's been using an ace wrap for comfort. It does seem to help. Review of Systems     Objective:   Physical Exam  Constitutional: She is oriented to person, place, and time. She appears well-developed and well-nourished.  HENT:  Head: Normocephalic and atraumatic.  Cardiovascular: Normal rate, regular rhythm and normal heart sounds.   Pulmonary/Chest: Effort normal and breath sounds normal.  Neurological: She is alert and oriented to person, place, and time.  Skin: Skin is warm and dry.  Psychiatric: She has a normal mood and affect. Her behavior is normal.          Assessment & Plan:  HTN- well-controlled. Continue current regimen. Follow up in 6 months.  Hypelipidemia - will recheck lipid panel. Otherwise she's doing well. Continue current regimen.   Left knee pain- recommend referral to sports medicine if she does not continue to improve over the next couple of weeks.

## 2014-07-19 ENCOUNTER — Other Ambulatory Visit: Payer: Self-pay | Admitting: *Deleted

## 2014-07-19 DIAGNOSIS — R748 Abnormal levels of other serum enzymes: Secondary | ICD-10-CM

## 2014-07-19 LAB — VITAMIN B12: Vitamin B-12: 635 pg/mL (ref 211–911)

## 2014-07-20 LAB — COMPLETE METABOLIC PANEL WITH GFR
ALK PHOS: 59 U/L (ref 39–117)
ALT: 18 U/L (ref 0–35)
AST: 16 U/L (ref 0–37)
Albumin: 3.9 g/dL (ref 3.5–5.2)
BUN: 19 mg/dL (ref 6–23)
CALCIUM: 9.9 mg/dL (ref 8.4–10.5)
CHLORIDE: 106 meq/L (ref 96–112)
CO2: 29 mEq/L (ref 19–32)
CREATININE: 0.49 mg/dL — AB (ref 0.50–1.10)
GFR, Est African American: >89 mL/min
GFR, Est Non African American: >89 mL/min
Glucose, Bld: 83 mg/dL (ref 70–99)
Potassium: 5.1 mEq/L (ref 3.5–5.3)
Sodium: 142 mEq/L (ref 135–145)
Total Bilirubin: 0.5 mg/dL (ref 0.2–1.2)
Total Protein: 6.8 g/dL (ref 6.0–8.3)

## 2014-07-20 LAB — LIPID PANEL
Cholesterol: 133 mg/dL (ref 0–200)
HDL: 51 mg/dL (ref >39)
LDL CALC: 63 mg/dL (ref 0–99)
TRIGLYCERIDES: 94 mg/dL (ref <150)
Total CHOL/HDL Ratio: 2.6 Ratio
VLDL: 19 mg/dL (ref 0–40)

## 2014-07-30 ENCOUNTER — Telehealth: Payer: Self-pay | Admitting: *Deleted

## 2014-07-30 NOTE — Telephone Encounter (Signed)
Pt wanted to know what Dr. Madilyn Fireman would recommend for her to use OTC for toenail fungus. Dr. Madilyn Fireman recommended that she try Vick's vapor rub. She should see results after months of using. Maryruth Eve, [name]

## 2014-09-04 ENCOUNTER — Other Ambulatory Visit: Payer: Self-pay | Admitting: Family Medicine

## 2014-10-23 ENCOUNTER — Emergency Department
Admission: EM | Admit: 2014-10-23 | Discharge: 2014-10-23 | Discharge status: Absent without leave | Payer: Commercial Managed Care - HMO | Source: Home / Self Care

## 2014-10-24 ENCOUNTER — Encounter: Payer: Self-pay | Admitting: Physician Assistant

## 2014-10-24 ENCOUNTER — Ambulatory Visit (INDEPENDENT_AMBULATORY_CARE_PROVIDER_SITE_OTHER): Payer: Commercial Managed Care - HMO | Admitting: Physician Assistant

## 2014-10-24 VITALS — BP 133/70 | HR 85 | Ht 63.5 in | Wt 157.0 lb

## 2014-10-24 DIAGNOSIS — R300 Dysuria: Secondary | ICD-10-CM

## 2014-10-24 DIAGNOSIS — N3090 Cystitis, unspecified without hematuria: Secondary | ICD-10-CM | POA: Diagnosis not present

## 2014-10-24 DIAGNOSIS — N390 Urinary tract infection, site not specified: Secondary | ICD-10-CM

## 2014-10-24 DIAGNOSIS — R82998 Other abnormal findings in urine: Secondary | ICD-10-CM

## 2014-10-24 DIAGNOSIS — R319 Hematuria, unspecified: Secondary | ICD-10-CM

## 2014-10-24 DIAGNOSIS — L298 Other pruritus: Secondary | ICD-10-CM | POA: Diagnosis not present

## 2014-10-24 DIAGNOSIS — N898 Other specified noninflammatory disorders of vagina: Secondary | ICD-10-CM

## 2014-10-24 DIAGNOSIS — L293 Anogenital pruritus, unspecified: Secondary | ICD-10-CM | POA: Diagnosis not present

## 2014-10-24 LAB — POCT URINALYSIS DIPSTICK
Bilirubin, UA: NEGATIVE
Glucose, UA: NEGATIVE
Ketones, UA: NEGATIVE
Nitrite, UA: NEGATIVE
PH UA: 6.5
Protein, UA: NEGATIVE
Spec Grav, UA: 1.015
Urobilinogen, UA: 0.2

## 2014-10-24 MED ORDER — CIPROFLOXACIN HCL 500 MG PO TABS: 500.0000 mg | ORAL_TABLET | Freq: Two times a day (BID) | ORAL | Status: DC

## 2014-10-24 NOTE — Progress Notes (Signed)
   Subjective:    Patient ID: Michelle Lawrence, female    DOB: [birth year], 69 y.o.   MRN: [id number]  HPI Pt presents to the clinic with vaginal discharge, irritation, and increased urinary frequency for last week or so. Not tried anything to make better. No abdominal pain, flank pain, fever, chills, nv/d.    Review of Systems  All other systems reviewed and are negative.      Objective:   Physical Exam  Constitutional: She is oriented to person, place, and time. She appears well-developed and well-nourished.  HENT:  Head: Normocephalic and atraumatic.  Cardiovascular: Normal rate, regular rhythm and normal heart sounds.   Pulmonary/Chest: Effort normal and breath sounds normal. She has no wheezes.  No CVA tenderness.   Abdominal: Soft. Bowel sounds are normal. She exhibits no distension and no mass. There is no tenderness. There is no rebound and no guarding.  Neurological: She is alert and oriented to person, place, and time.  Skin: Skin is dry.  Psychiatric: She has a normal mood and affect. Her behavior is normal.          Assessment & Plan:  Vaginal discharge/urinary frequency- .. Results for orders placed or performed in visit on 10/24/14  WET PREP FOR [place], YEAST, CLUE  Result Value Ref Range   Yeast Wet Prep HPF POC NONE SEEN NONE SEEN   Trich, Wet Prep NONE SEEN NONE SEEN   Clue Cells Wet Prep HPF POC FEW (A) NONE SEEN   WBC, Wet Prep HPF POC MANY (A) NONE SEEN  Urine Culture  Result Value Ref Range   Colony Count 30,000 COLONIES/ML    Organism ID, Bacteria Multiple bacterial morphotypes present, none    Organism ID, Bacteria predominant. Suggest appropriate recollection if     Organism ID, Bacteria clinically indicated.   POCT urinalysis dipstick  Result Value Ref Range   Color, UA yellow    Clarity, UA clear    Glucose, UA neg    Bilirubin, UA neg    Ketones, UA neg    Spec Grav, UA 1.015    Blood, UA trace-intact    pH, UA 6.5    Protein, UA neg    Urobilinogen, UA 0.2    Nitrite, UA neg    Leukocytes, UA small (1+)    lueks and blood with being symptomatic treated. Culture did come back negative. Pt advised to finsih abx. Metronidazole sent to treat BV.

## 2014-10-25 ENCOUNTER — Other Ambulatory Visit: Payer: Self-pay

## 2014-10-25 LAB — WET PREP FOR TRICH, YEAST, CLUE
Trich, Wet Prep: NONE SEEN
Yeast Wet Prep HPF POC: NONE SEEN

## 2014-10-25 MED ORDER — METRONIDAZOLE 500 MG PO TABS: 500.0000 mg | ORAL_TABLET | Freq: Two times a day (BID) | ORAL | Status: DC

## 2014-10-26 LAB — URINE CULTURE: Colony Count: 30000

## 2014-11-01 ENCOUNTER — Encounter: Payer: Self-pay | Admitting: Family Medicine

## 2014-11-01 NOTE — Progress Notes (Signed)
Called patient & informed her about the following: Call pt: no one bacteria grew. Finish abx. If symptoms persist need recollection. Likely more contamination than infection.

## 2014-11-02 ENCOUNTER — Encounter: Payer: Medicare HMO | Admitting: Family Medicine

## 2014-11-15 ENCOUNTER — Encounter: Payer: Medicare HMO | Admitting: Family Medicine

## 2014-12-05 ENCOUNTER — Telehealth: Payer: Self-pay | Admitting: *Deleted

## 2014-12-05 NOTE — Telephone Encounter (Signed)
Pt called and lvm stating that she has been having flu like sxs. I called pt back did not get an answer.Michelle Lawrence [place]

## 2014-12-06 NOTE — Telephone Encounter (Signed)
Pt reports having a lot of congestion from her nose and she is feeling better she has been taking benadryl which she feels has helped her. I told her to try either zyrtec or Claritin and a nasal saline rinse, should her mucus change in color, or she begins to run a fever I advised that she call to schedule an appt pt voiced understanding and agreed.Michelle Lawrence

## 2014-12-08 DIAGNOSIS — Z0100 Encounter for examination of eyes and vision without abnormal findings: Secondary | ICD-10-CM | POA: Diagnosis not present

## 2014-12-08 DIAGNOSIS — H524 Presbyopia: Secondary | ICD-10-CM | POA: Diagnosis not present

## 2014-12-08 DIAGNOSIS — H52209 Unspecified astigmatism, unspecified eye: Secondary | ICD-10-CM | POA: Diagnosis not present

## 2014-12-08 DIAGNOSIS — H5203 Hypermetropia, bilateral: Secondary | ICD-10-CM | POA: Diagnosis not present

## 2014-12-14 ENCOUNTER — Encounter: Payer: Commercial Managed Care - HMO | Admitting: Family Medicine

## 2014-12-27 ENCOUNTER — Telehealth: Payer: Self-pay | Admitting: Family Medicine

## 2014-12-27 NOTE — Telephone Encounter (Signed)
Patient called clinic stating she had a hard red bump come up on her Rt buttocks last night. Pt states the are is not sore/painful, no drainage, and no bug bite. The area are the bump is normal skin color, just the bump itself is red. Advised Pt to keep a watch on this area and if she notices any changes (fever, redness, pain) to contact the office and we will move up her scheduled f/u with Dr. Madilyn Fireman. Verbalized understanding.

## 2015-01-07 ENCOUNTER — Ambulatory Visit: Payer: Medicare HMO | Admitting: Family Medicine

## 2015-01-07 DIAGNOSIS — R69 Illness, unspecified: Secondary | ICD-10-CM | POA: Diagnosis not present

## 2015-01-10 ENCOUNTER — Encounter: Payer: Self-pay | Admitting: Family Medicine

## 2015-01-10 ENCOUNTER — Ambulatory Visit (INDEPENDENT_AMBULATORY_CARE_PROVIDER_SITE_OTHER): Payer: Commercial Managed Care - HMO | Admitting: Family Medicine

## 2015-01-10 VITALS — BP 146/72 | HR 74 | Wt 158.0 lb

## 2015-01-10 DIAGNOSIS — Z1159 Encounter for screening for other viral diseases: Secondary | ICD-10-CM

## 2015-01-10 DIAGNOSIS — I10 Essential (primary) hypertension: Secondary | ICD-10-CM | POA: Diagnosis not present

## 2015-01-10 DIAGNOSIS — Z23 Encounter for immunization: Secondary | ICD-10-CM | POA: Diagnosis not present

## 2015-01-10 LAB — BASIC METABOLIC PANEL WITH GFR
BUN: 14 mg/dL (ref 6–23)
CO2: 26 meq/L (ref 19–32)
CREATININE: 0.43 mg/dL — AB (ref 0.50–1.10)
Calcium: 9.9 mg/dL (ref 8.4–10.5)
Chloride: 104 mEq/L (ref 96–112)
GFR, Est African American: >89 mL/min
GFR, Est Non African American: >89 mL/min
Glucose, Bld: 77 mg/dL (ref 70–99)
Potassium: 4.3 mEq/L (ref 3.5–5.3)
Sodium: 141 mEq/L (ref 135–145)

## 2015-01-10 NOTE — Progress Notes (Signed)
   Subjective:    Patient ID: Michelle Lawrence, female    DOB: [birth year], 69 y.o.   MRN: [id number]  HPI Hypertension- Pt denies chest pain, SOB, dizziness, or heart palpitations.  Taking meds as directed w/o problems.  Denies medication side effects.  Says had a tooth pulled on Monday and is on antibiotics. She's currently taking amoxicillin 500 mg. She still has several days left. Says almost passed put afterwards. She is up 6 lbs from last year. She also had some caffeine this morning.   Review of Systems     Objective:   Physical Exam  Constitutional: She is oriented to person, place, and time. She appears well-developed and well-nourished.  HENT:  Head: Normocephalic and atraumatic.  Cardiovascular: Normal rate, regular rhythm and normal heart sounds.   Pulmonary/Chest: Effort normal and breath sounds normal.  Neurological: She is alert and oriented to person, place, and time.  Skin: Skin is warm and dry.  Psychiatric: She has a normal mood and affect. Her behavior is normal.          Assessment & Plan:  HTN - uncontrolled today. Normally her blood pressures well controlled. We don't have her come back in a month for nurse blood pressure check once her mouth heels and she's feeling a little bit better. Due for BMP.    Screen hep C based on age   Discussed need for Prevnar 13 vaccine. She will hold off today but will consider at the next visit.

## 2015-01-11 LAB — HEPATITIS C ANTIBODY: HCV Ab: NEGATIVE

## 2015-01-15 DIAGNOSIS — S6991XA Unspecified injury of right wrist, hand and finger(s), initial encounter: Secondary | ICD-10-CM | POA: Diagnosis not present

## 2015-01-24 ENCOUNTER — Encounter: Payer: Self-pay | Admitting: Family Medicine

## 2015-01-24 ENCOUNTER — Ambulatory Visit (INDEPENDENT_AMBULATORY_CARE_PROVIDER_SITE_OTHER): Payer: Commercial Managed Care - HMO | Admitting: Family Medicine

## 2015-01-24 VITALS — BP 144/77 | HR 81 | Wt 155.0 lb

## 2015-01-24 DIAGNOSIS — L0291 Cutaneous abscess, unspecified: Secondary | ICD-10-CM | POA: Diagnosis not present

## 2015-01-24 DIAGNOSIS — L739 Follicular disorder, unspecified: Secondary | ICD-10-CM

## 2015-01-24 MED ORDER — SULFAMETHOXAZOLE-TRIMETHOPRIM 800-160 MG PO TABS: 1.0000 | ORAL_TABLET | Freq: Two times a day (BID) | ORAL | Status: DC

## 2015-01-24 NOTE — Progress Notes (Signed)
   Subjective:    Patient ID: Michelle Lawrence, female    DOB: 08/24/1945, 69 y.o.   MRN: [id number]  HPI Patient comes in today complaining of several red swollen bumps in her groin area. She said unfortunately she gets these recurrently. She's been told in the past that she has folliculitis. No fevers chills or sweats. Though she said she did feel little hot yesterday. She is most concerned about the one on her right but not. It seems to be the largest is the most tender. It's painful to sit for periods of time.  Review of Systems     Objective:   Physical Exam  Genitourinary:     Musculoskeletal:       Back:    She has a smaller abscess on the left labia, one over the left perineum, 1 on the lower Botox and one on the right Botox it's quite large. Please see drawing above. No active drainage from any of them. She has several smaller pustules scattered over the pubis symphysis.      Assessment & Plan:  Folliculitis over pubic symphysis - she has several scattered superficial pustules. Because address this later. She might benefit from a low-dose of doxycycline for a period of time or a topical anti-biotic gel.  abscess - multiple. On on the buttock performed I & D. Patient tolerated well. Recommend warm compresses on her bottom.  Will start on Bactrim. Cover for MRSA since works in [employer].   Incision and Drainage Procedure Note  Pre-operative Diagnosis: abscess  Post-operative Diagnosis: same  Indications: infection/cellulitis Anesthesia: 1% lidocaine with epinephrine  Procedure Details  The procedure, risks and complications have been discussed in detail (including, but not limited to airway compromise, infection, bleeding) with the patient, and the patient has signed consent to the procedure.  The skin was sterilely prepped and draped over the affected area in the usual fashion. After adequate local anesthesia, I&D with a #11 blade was performed on the right  buttock/abscess. Purulent drainage: present.  The wound was probed with a sterile applicator. The abscess was 2-1/2 cm deep. Iodoform gauze packing placed into the wound. The patient was observed until stable.  Findings: Purulent drainage  EBL: 1.0 cc's  Drains: Iodoform packing placed. Follow-up when care discussed with patient.  Condition: Tolerated procedure well   Complications: none.

## 2015-01-24 NOTE — Patient Instructions (Signed)
Okay to remove dressing tomorrow. Pull the gauze about an inch out. Trend the in and make sure to leave a tail. Replace a fresh dressing and continue to do this until all of the gauze is completely out. Make sure to pick up the prescription for the inner miotic and get started on it as soon as possible today.

## 2015-01-25 ENCOUNTER — Ambulatory Visit: Payer: Commercial Managed Care - HMO

## 2015-01-28 ENCOUNTER — Telehealth: Payer: Self-pay | Admitting: *Deleted

## 2015-01-28 MED ORDER — LIDOCAINE HCL 4 % EX GEL: 1.0000 "application " | Freq: Four times a day (QID) | CUTANEOUS | Status: DC | PRN

## 2015-01-28 NOTE — Telephone Encounter (Signed)
Called pt did not get an answer. I will fwd her concerns to Dr. Madilyn Fireman for f/u

## 2015-01-28 NOTE — Telephone Encounter (Signed)
Prescription sent for lidocaine gel. Though, if she feels that the lesions are getting worse then please let me know.

## 2015-01-28 NOTE — Telephone Encounter (Signed)
Pt left vm stating that her bottom is still very sore.  She wants to know if there is something topical that can be sent over for her.  Please advise.

## 2015-01-28 NOTE — Telephone Encounter (Signed)
Pt called and lvm stating that the medication (Abx) has caused mouth sores. She wanted to know if this is a side effect of the medication and what she should do.

## 2015-01-29 NOTE — Telephone Encounter (Signed)
Pharm called this morning and they do not have the 4% gel.  They have a 5% cream & 2% jelly.  Dr. Madilyn Fireman said whatever is the cheapest for the pt.  Pharm already notified pt of rx yesterday.

## 2015-01-29 NOTE — Telephone Encounter (Signed)
Tried calling pt back this morning on her home phone; no answer or vm.  Will try again later.

## 2015-02-07 ENCOUNTER — Ambulatory Visit (INDEPENDENT_AMBULATORY_CARE_PROVIDER_SITE_OTHER): Payer: Commercial Managed Care - HMO | Admitting: Family Medicine

## 2015-02-07 VITALS — BP 136/63 | HR 88 | Wt 155.0 lb

## 2015-02-07 DIAGNOSIS — I10 Essential (primary) hypertension: Secondary | ICD-10-CM

## 2015-02-07 MED ORDER — LISINOPRIL 10 MG PO TABS: 10.0000 mg | ORAL_TABLET | Freq: Every day | ORAL | Status: DC

## 2015-02-07 NOTE — Progress Notes (Signed)
HTN - based on today's blood pressure it is better but it still borderline. I really think she needs to start a blood pressure medication. Ditmars her prescription for lisinopril to take once a day. She can follow back up in one month for a blood pressure check and also have a BMP done that day just to check potassium levels.

## 2015-02-07 NOTE — Progress Notes (Signed)
Patient came into clinic today for nurse visit blood pressure check. Reports no changes in her medications other than finishing the antibiotic she was previously taking. Advised I would get her BP from today's visit to Dr. Madilyn Fireman and would call her with any possible medication changes. Verbalized understanding.

## 2015-02-07 NOTE — Progress Notes (Signed)
Pt questions if she should take the benazepril along with the lisinopril or just take the lisinopril. Advised I would route to PCP for review then call her back.

## 2015-02-08 ENCOUNTER — Other Ambulatory Visit: Payer: Self-pay | Admitting: Family Medicine

## 2015-02-08 MED ORDER — BENAZEPRIL HCL 20 MG PO TABS: 20.0000 mg | ORAL_TABLET | Freq: Every day | ORAL | Status: DC

## 2015-02-08 NOTE — Progress Notes (Signed)
Pt questions if she should take the benazepril along with the lisinopril or just take the lisinopril. Advised I would route to PCP for review then call her back.

## 2015-02-08 NOTE — Progress Notes (Signed)
Pt notified of change, states she will call on Monday to schedule a BP recheck appt.

## 2015-02-08 NOTE — Progress Notes (Unsigned)
I apologize, I did not see the benazepril on her med list. Discontinue lisinopril and we will increase the benazepril to 20 mg. New prescription sent to the pharmacy. Make sure she has an appointment for follow-up in the next 2-3 months if possible.

## 2015-03-07 ENCOUNTER — Other Ambulatory Visit: Payer: Self-pay | Admitting: Family Medicine

## 2015-03-07 ENCOUNTER — Ambulatory Visit (INDEPENDENT_AMBULATORY_CARE_PROVIDER_SITE_OTHER): Payer: Commercial Managed Care - HMO | Admitting: Family Medicine

## 2015-03-07 VITALS — BP 142/80 | HR 65 | Wt 154.0 lb

## 2015-03-07 DIAGNOSIS — I10 Essential (primary) hypertension: Secondary | ICD-10-CM | POA: Diagnosis not present

## 2015-03-07 LAB — BASIC METABOLIC PANEL WITH GFR
BUN: 14 mg/dL (ref 6–23)
CO2: 25 mEq/L (ref 19–32)
Calcium: 9.4 mg/dL (ref 8.4–10.5)
Chloride: 107 mEq/L (ref 96–112)
Creat: 0.48 mg/dL — ABNORMAL LOW (ref 0.50–1.10)
GFR, Est African American: >89 mL/min
GLUCOSE: 81 mg/dL (ref 70–99)
POTASSIUM: 4.3 meq/L (ref 3.5–5.3)
SODIUM: 141 meq/L (ref 135–145)

## 2015-03-07 NOTE — Progress Notes (Signed)
Attempted to contact Pt, phone is busy. Will try again.

## 2015-03-07 NOTE — Progress Notes (Signed)
   Subjective:    Patient ID: Michelle Lawrence, female    DOB: Jan 29, 1946, 69 y.o.   MRN: [id number]  HPI    Review of Systems     Objective:   Physical Exam        Assessment & Plan:  I agree. We really need to see what her blood pressure is doing while she is on her blood pressure medication. SHE needs to take it regularly and then schedule another nurse visit for blood pressure check. Beatrice Lecher, MD

## 2015-03-07 NOTE — Progress Notes (Signed)
Patient came into clinic this morning for nurse visit BP check. Pt states she had her BP taken at work yesterday and it was "119/82." Pt BP was elevated both times it was taken in clinic this am. Pt reports not taking her BP Rx this am, she came into clinic fasting to get her blood work redone. Advised Pt I would route her BP reading to PCP for review and if she needs to come back in for another recheck to make sure she takes her BP Rx at least 30 min prior to her visit. Verbalized understanding. No further questions.

## 2015-03-08 NOTE — Progress Notes (Signed)
Quick Note:  All labs are normal. ______ 

## 2015-04-05 ENCOUNTER — Encounter: Payer: Self-pay | Admitting: Family Medicine

## 2015-04-05 ENCOUNTER — Ambulatory Visit (INDEPENDENT_AMBULATORY_CARE_PROVIDER_SITE_OTHER): Payer: Commercial Managed Care - HMO | Admitting: Family Medicine

## 2015-04-05 VITALS — BP 125/65 | HR 70 | Wt 155.0 lb

## 2015-04-05 DIAGNOSIS — L02214 Cutaneous abscess of groin: Secondary | ICD-10-CM | POA: Diagnosis not present

## 2015-04-05 MED ORDER — DOXYCYCLINE HYCLATE 100 MG PO TABS: 100.0000 mg | ORAL_TABLET | Freq: Two times a day (BID) | ORAL | Status: DC

## 2015-04-05 NOTE — Progress Notes (Signed)
   Subjective:    Patient ID: Michelle Lawrence, female    DOB: 05/20/1946, 69 y.o.   MRN: [id number]  HPI  here today for recurrent abscess. She was seen back in January had lance one on her butt cheek. She also had a lot of folliculitis. Today she has one lesion over the pubis symphysis and one along the right groin crease. She denies any fevers chills or sweats. The one on the pubic area did drain on its own and she thinks is actually getting smaller and healing. The one in the right groin crease has been there for a couple of days.   Review of Systems     Objective:   Physical Exam  Constitutional: She is oriented to person, place, and time. She appears well-developed and well-nourished.  HENT:  Head: Normocephalic and atraumatic.  Genitourinary:     Neurological: She is alert and oriented to person, place, and time.  Skin: Skin is warm and dry.     Psychiatric: She has a normal mood and affect. Her behavior is normal.          Assessment & Plan:   abscesses-the one on the pubis symphysis does appear to be healing. The one on the right groin crease is indurated and firm measuring approximately 1 x 2 cm. She would really prefer to try antibiotic and warm compresses since it is seemed to be a head on the middle of the lesion which might be ready to drain in the next day or 2. If not resolving on its own or getting worse or develop fevers or chills etc. The please come back in for incision and drainage.

## 2015-04-05 NOTE — Patient Instructions (Signed)

## 2015-04-26 ENCOUNTER — Telehealth: Payer: Self-pay | Admitting: Family Medicine

## 2015-04-26 NOTE — Telephone Encounter (Signed)
Pt called to see if her and her husband should get their Prevnar 13 vaccines with their flu shots this upcoming week. Will route to PCP for review.

## 2015-04-29 ENCOUNTER — Ambulatory Visit (INDEPENDENT_AMBULATORY_CARE_PROVIDER_SITE_OTHER): Payer: Commercial Managed Care - HMO | Admitting: Family Medicine

## 2015-04-29 ENCOUNTER — Encounter: Payer: Self-pay | Admitting: Family Medicine

## 2015-04-29 VITALS — BP 159/72 | HR 87 | Wt 154.0 lb

## 2015-04-29 DIAGNOSIS — L0390 Cellulitis, unspecified: Secondary | ICD-10-CM

## 2015-04-29 DIAGNOSIS — L0291 Cutaneous abscess, unspecified: Secondary | ICD-10-CM

## 2015-04-29 MED ORDER — SULFAMETHOXAZOLE-TRIMETHOPRIM 800-160 MG PO TABS
1.0000 | ORAL_TABLET | Freq: Two times a day (BID) | ORAL | Status: DC
Start: 2015-04-29 — End: 2015-05-03

## 2015-04-29 MED ORDER — MUPIROCIN 2 % EX OINT: 1.0000 "application " | TOPICAL_OINTMENT | Freq: Two times a day (BID) | CUTANEOUS | Status: DC

## 2015-04-29 NOTE — Telephone Encounter (Signed)
Yes, Ok to get prevnar 13 for both of them.

## 2015-04-29 NOTE — Telephone Encounter (Signed)
Information added to upcoming appt notes.

## 2015-04-29 NOTE — Patient Instructions (Signed)
Scrub entire body with Hibiclens twice a week to try to prevent recurrence of these boils. Use the Bactroban in the nostrils twice a day for one week and then twice a week for a few weeks for maintenance.

## 2015-04-29 NOTE — Progress Notes (Signed)
   Subjective:    Patient ID: Michelle Lawrence, female    DOB: 1945-10-15, 69 y.o.   MRN: [id number]  HPI Has a boil on the right buttock. Noticed it a couple of days ago. Has been soaking in Epsom salts. When she woke at this morning she felt like it was more tender. No fevers chills or sweats. These have been recurrent. She works in a nursing home.     Review of Systems     Objective:   Physical Exam  Constitutional: She is oriented to person, place, and time. She appears well-developed and well-nourished.  HENT:  Head: Normocephalic and atraumatic.  Neurological: She is oriented to person, place, and time.  Skin: Skin is warm and dry.  In the right buttock cheek. She has an area of approximately 4-5 cm of erythema and increased warmth that fades of the border. In the center is an approximately 1 cm area of induration.  Psychiatric: She has a normal mood and affect. Her behavior is normal.          Assessment & Plan:  Boil/abscess with cellulits - will tx with bactrim DS x 10 days.  Call if not healing well.  For prevention of recurrent will have her use the bactroban and hibiblens scrbs.   Incision and Drainage Procedure Note  Pre-operative Diagnosis: abscess with cellulits   Post-operative Diagnosis: same  Indications: infection   Anesthesia: 1% lidocaine with epinephrine  Procedure Details  The procedure, risks and complications have been discussed in detail (including, but not limited to airway compromise, infection, bleeding) with the patient, and the patient has signed consent to the procedure.  The skin was sterilely prepped and draped over the affected area in the usual fashion. After adequate local anesthesia, I&D with a #11 blade was performed on the right buttock. Purulent drainage: absent,. Wound was probed with sterile swab. No tracks found.   The patient was observed until stable.  Findings: Mostly blood.   EBL: 0.25 cc's  Drains: none.   Condition:  Tolerated procedure well   Complications: none.

## 2015-05-02 ENCOUNTER — Telehealth: Payer: Self-pay | Admitting: Emergency Medicine

## 2015-05-02 ENCOUNTER — Ambulatory Visit (INDEPENDENT_AMBULATORY_CARE_PROVIDER_SITE_OTHER): Payer: Commercial Managed Care - HMO | Admitting: Family Medicine

## 2015-05-02 VITALS — BP 134/61 | HR 86 | Temp 98.6°F

## 2015-05-02 DIAGNOSIS — Z23 Encounter for immunization: Secondary | ICD-10-CM | POA: Diagnosis not present

## 2015-05-02 NOTE — Telephone Encounter (Signed)
Patient calls to report mouth ulcers she attributes to Bactrim. Wonders if she could either reduce dose or change to another medication? pak

## 2015-05-02 NOTE — Progress Notes (Signed)
   Subjective:    Patient ID: Michelle Lawrence, female    DOB: 02/05/1946, 69 y.o.   MRN: [id number]  HPI    Review of Systems     Objective:   Physical Exam        Assessment & Plan:  Agree with below.   Beatrice Lecher, MD

## 2015-05-02 NOTE — Telephone Encounter (Signed)
Can tyr decrease to half a tab BID and see if helps. If not then we can change antibiotics.

## 2015-05-02 NOTE — Progress Notes (Signed)
   Subjective:    Patient ID: Michelle Lawrence, female    DOB: [birth year], 69 y.o.   MRN: [id number]  HPI  Michelle Lawrence is here for flu vaccine and Prevnar 13.  Review of Systems     Objective:   Physical Exam        Assessment & Plan:  Patient tolerated injection well without complications. Patient advised to schedule next flu injection in 1 year.

## 2015-05-03 ENCOUNTER — Telehealth: Payer: Self-pay | Admitting: *Deleted

## 2015-05-03 MED ORDER — DOXYCYCLINE HYCLATE 100 MG PO TABS: 100.0000 mg | ORAL_TABLET | Freq: Two times a day (BID) | ORAL | Status: DC

## 2015-05-03 NOTE — Telephone Encounter (Signed)
Pt called and stated that the bactrum is not working and she has mouth ulcers and would like something else sent to her pharmacy. Will fwd to pcp.Michelle Lawrence

## 2015-05-03 NOTE — Telephone Encounter (Signed)
New rx sent

## 2015-05-13 ENCOUNTER — Telehealth: Payer: Self-pay | Admitting: *Deleted

## 2015-05-13 NOTE — Telephone Encounter (Signed)
Pt wanted to know if it would be ok for her to increase her b12 to every day. Pt advised that this would be ok. Michelle Lawrence, [name]

## 2015-06-06 DIAGNOSIS — H524 Presbyopia: Secondary | ICD-10-CM | POA: Diagnosis not present

## 2015-06-06 DIAGNOSIS — H5210 Myopia, unspecified eye: Secondary | ICD-10-CM | POA: Diagnosis not present

## 2015-06-07 ENCOUNTER — Other Ambulatory Visit: Payer: Self-pay | Admitting: Family Medicine

## 2015-06-07 DIAGNOSIS — Z139 Encounter for screening, unspecified: Secondary | ICD-10-CM

## 2015-06-27 ENCOUNTER — Telehealth: Payer: Self-pay | Admitting: *Deleted

## 2015-06-27 ENCOUNTER — Ambulatory Visit (INDEPENDENT_AMBULATORY_CARE_PROVIDER_SITE_OTHER): Payer: Commercial Managed Care - HMO

## 2015-06-27 DIAGNOSIS — Z1231 Encounter for screening mammogram for malignant neoplasm of breast: Secondary | ICD-10-CM | POA: Diagnosis not present

## 2015-06-27 DIAGNOSIS — Z139 Encounter for screening, unspecified: Secondary | ICD-10-CM

## 2015-06-27 IMAGING — MG MM DIGITAL SCREENING
5 series · 5 of 5 positions shown · non-contrast
Comparison: Previous exam(s).

CLINICAL DATA: Screening.

EXAM:
DIGITAL SCREENING BILATERAL MAMMOGRAM WITH CAD

[R CC]
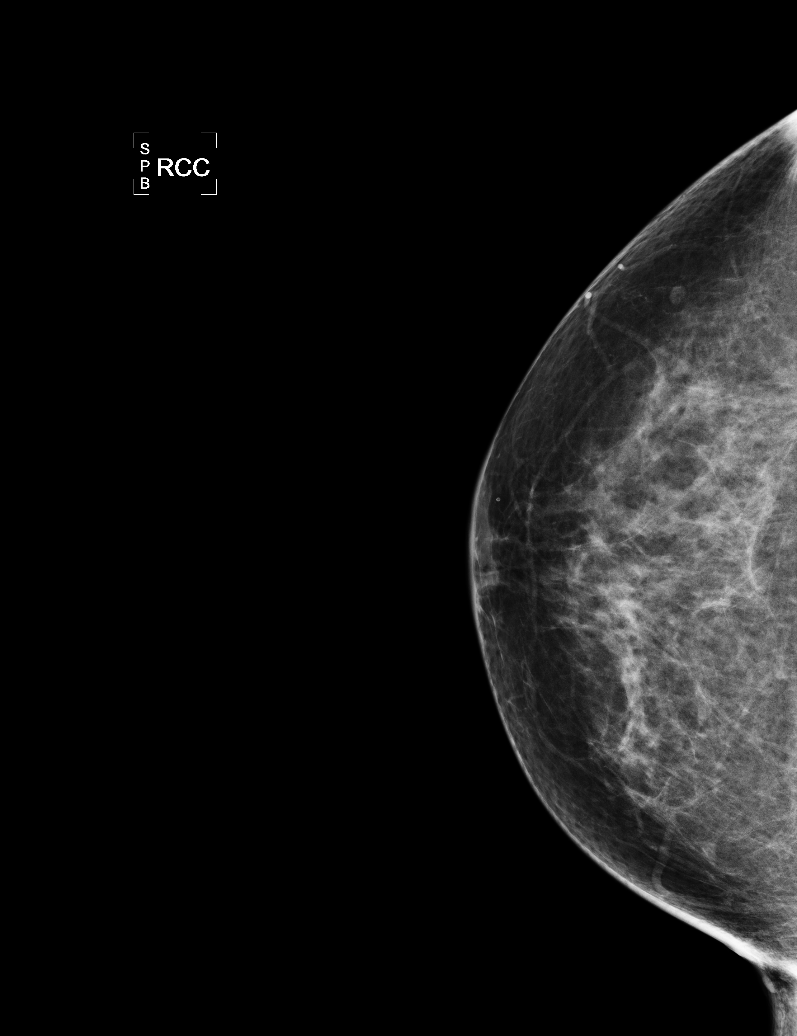

[L CC]
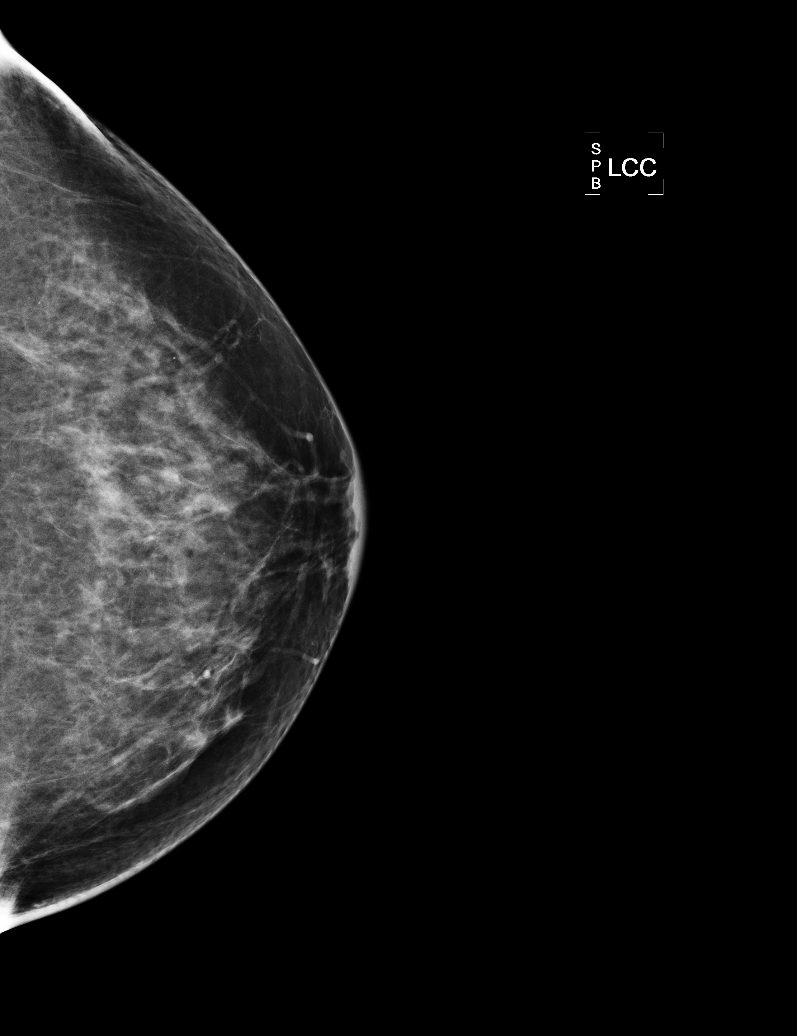

[L MLO]
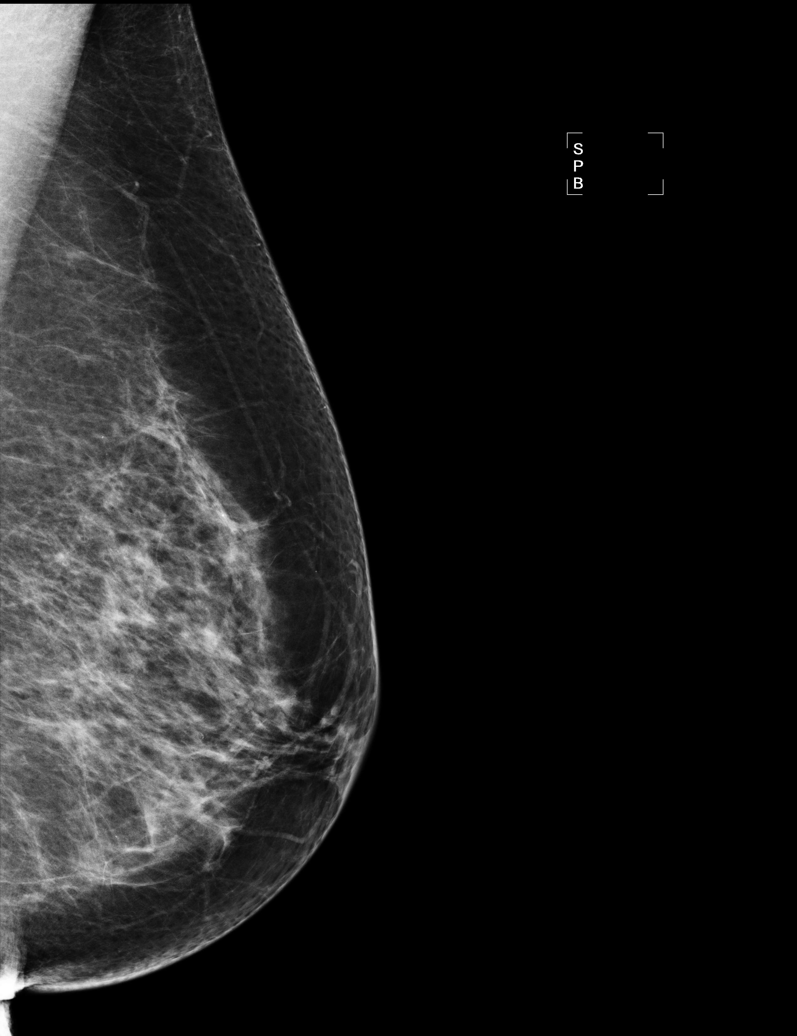

[R MLO (1 of 2)]
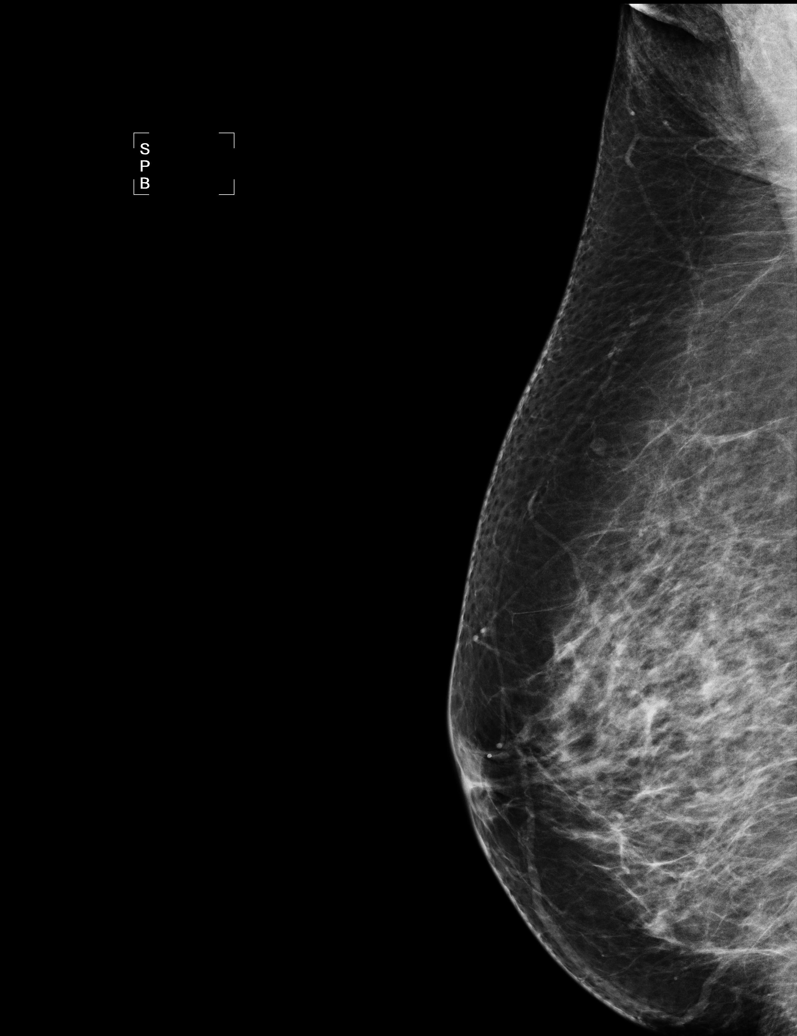

[R MLO (2 of 2)]
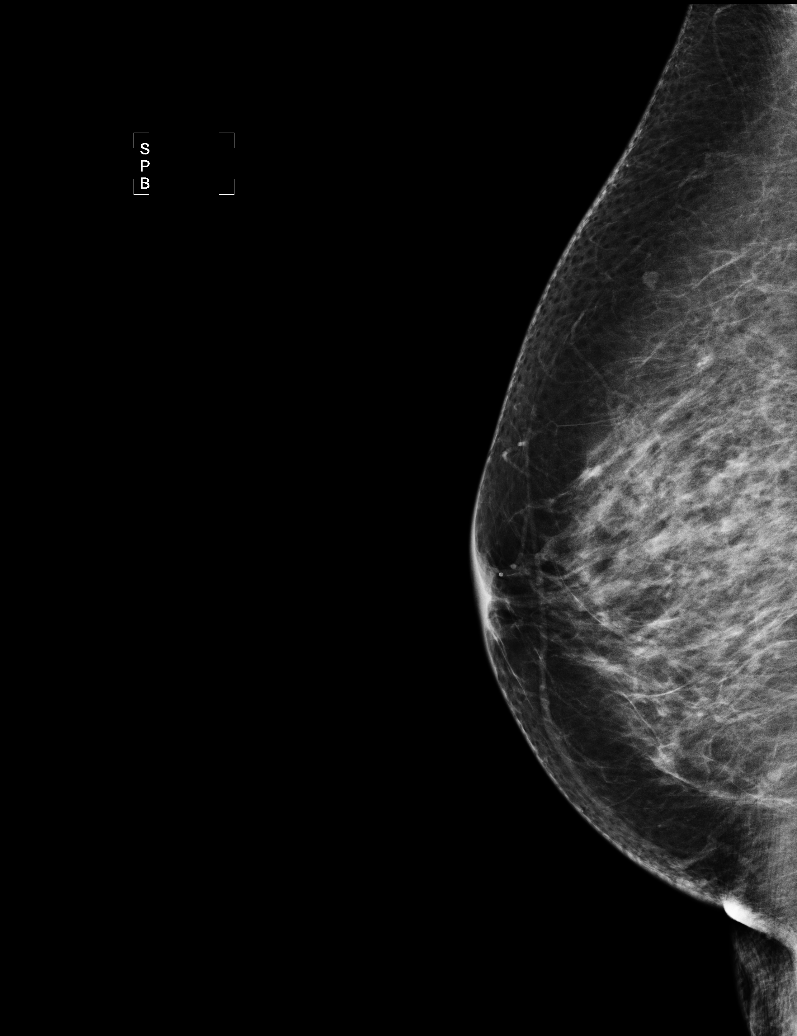

[5 of 5 positions shown; findings below may reference images not displayed]

ACR Breast Density Category b: There are scattered areas of
fibroglandular density.
FINDINGS: There are no findings suspicious for malignancy. Images were
processed with CAD.
IMPRESSION: No mammographic evidence of malignancy. A result letter of this
screening mammogram will be mailed directly to the patient.

RECOMMENDATION:
Screening mammogram in one year. (Code:AS-G-LCT)

BI-RADS CATEGORY  1: Negative.

## 2015-06-27 NOTE — Telephone Encounter (Signed)
Pt called and lvm asking if it would be ok for her to use the Bactroban. I called her back and informed her that she can use this 2x a wk for maintenance. She stated that she used this Tuesday and Wednesday and wanted to know since she was going to be working this weekend would it be ok for her to use it again. Michelle Lawrence, [name] [name]]

## 2015-08-01 ENCOUNTER — Other Ambulatory Visit: Payer: Self-pay | Admitting: Family Medicine

## 2015-09-02 ENCOUNTER — Other Ambulatory Visit: Payer: Self-pay | Admitting: Family Medicine

## 2015-09-06 ENCOUNTER — Other Ambulatory Visit: Payer: Self-pay

## 2015-09-06 MED ORDER — ROSUVASTATIN CALCIUM 20 MG PO TABS: 20.0000 mg | ORAL_TABLET | Freq: Every day | ORAL | Status: DC

## 2015-09-09 ENCOUNTER — Ambulatory Visit (INDEPENDENT_AMBULATORY_CARE_PROVIDER_SITE_OTHER): Payer: Commercial Managed Care - HMO | Admitting: Family Medicine

## 2015-09-09 ENCOUNTER — Encounter: Payer: Self-pay | Admitting: Family Medicine

## 2015-09-09 VITALS — BP 136/59 | HR 105 | Wt 150.0 lb

## 2015-09-09 DIAGNOSIS — E785 Hyperlipidemia, unspecified: Secondary | ICD-10-CM | POA: Diagnosis not present

## 2015-09-09 DIAGNOSIS — I10 Essential (primary) hypertension: Secondary | ICD-10-CM

## 2015-09-09 NOTE — Progress Notes (Signed)
   Subjective:    Patient ID: Michelle Lawrence, female    DOB: 10-23-1945, 70 y.o.   MRN: [id number]  HPI Hypertension- Pt denies chest pain, SOB, dizziness, or heart palpitations.  Taking meds as directed w/o problems.  Denies medication side effects.    Hyperlipidemia - she was concerned about her Crestor. She is now on the generic.  She  Has been on it a couple of days. She woke with a HA this morning and wonders if it could be from the new medication. She's not his parents any body aches or pains..     Review of Systems     Objective:   Physical Exam  Constitutional: She is oriented to person, place, and time. She appears well-developed and well-nourished.  HENT:  Head: Normocephalic and atraumatic.  Cardiovascular: Normal rate, regular rhythm and normal heart sounds.   Pulmonary/Chest: Effort normal and breath sounds normal.  Neurological: She is alert and oriented to person, place, and time.  Skin: Skin is warm and dry.  Psychiatric: She has a normal mood and affect. Her behavior is normal.          Assessment & Plan:  HTN - well controlled. F/U in 6 mo. for blood work.  Hyperlpidemia.  - Encouraged her to stick with the Crestor generic a little bit longer. She's having any problems and we can always switch back to the brand Crestor.

## 2015-09-11 ENCOUNTER — Telehealth: Payer: Self-pay

## 2015-09-11 MED ORDER — ROSUVASTATIN CALCIUM 20 MG PO TABS: 20.0000 mg | ORAL_TABLET | Freq: Every day | ORAL | Status: DC

## 2015-09-11 NOTE — Telephone Encounter (Signed)
Medication sent to pharmacy  

## 2015-09-11 NOTE — Telephone Encounter (Signed)
Okay to send over new prescription for brand only. It may require prior authorization.

## 2015-09-11 NOTE — Telephone Encounter (Signed)
Tauni called and reports side effects to the generic crestor. She has nausea, vomiting and headaches since starting the generic. She wants brand only for the crestor.

## 2015-09-12 ENCOUNTER — Telehealth: Payer: Self-pay | Admitting: Family Medicine

## 2015-09-12 NOTE — Telephone Encounter (Signed)
Received fax for prior authorization on Brand Crestor sent through cover my meds and medication was approved . - CF

## 2015-09-16 DIAGNOSIS — E785 Hyperlipidemia, unspecified: Secondary | ICD-10-CM | POA: Diagnosis not present

## 2015-09-16 DIAGNOSIS — I10 Essential (primary) hypertension: Secondary | ICD-10-CM | POA: Diagnosis not present

## 2015-09-17 LAB — COMPLETE METABOLIC PANEL WITH GFR
ALT: 12 U/L (ref 6–29)
AST: 13 U/L (ref 10–35)
Albumin: 3.8 g/dL (ref 3.6–5.1)
Alkaline Phosphatase: 64 U/L (ref 33–130)
BUN: 12 mg/dL (ref 7–25)
CALCIUM: 9 mg/dL (ref 8.6–10.4)
CHLORIDE: 108 mmol/L (ref 98–110)
CO2: 26 mmol/L (ref 20–31)
Creat: 0.53 mg/dL (ref 0.50–0.99)
Glucose, Bld: 82 mg/dL (ref 65–99)
POTASSIUM: 4.6 mmol/L (ref 3.5–5.3)
Sodium: 140 mmol/L (ref 135–146)
Total Bilirubin: 0.4 mg/dL (ref 0.2–1.2)
Total Protein: 6.4 g/dL (ref 6.1–8.1)

## 2015-09-17 LAB — LIPID PANEL
CHOL/HDL RATIO: 4.5 ratio (ref <=5.0)
CHOLESTEROL: 192 mg/dL (ref 125–200)
HDL: 43 mg/dL — AB (ref >=46)
LDL CALC: 126 mg/dL (ref <130)
TRIGLYCERIDES: 115 mg/dL (ref <150)
VLDL: 23 mg/dL (ref <30)

## 2015-09-20 NOTE — Telephone Encounter (Signed)
Received authorization on Crestor. Valid: 09/13/15-09/12/17. Pharmacy notified.

## 2015-09-30 ENCOUNTER — Telehealth: Payer: Self-pay | Admitting: *Deleted

## 2015-09-30 NOTE — Telephone Encounter (Signed)
Pt called and lvm stating that when she went to p/u Rx for Crestor it was $ 200 for 30 pills. She cannot take the generic. Maryruth Eve, [name]

## 2015-09-30 NOTE — Telephone Encounter (Signed)
Called pt and lvm informing her to check with her mail order pharmacy to see if this would be cheaper for her. Otherwise we would have to send an Rx for Livalo which may still require a PA. Pt advised to rtn call.Audelia Hives [place]

## 2015-10-03 NOTE — Telephone Encounter (Signed)
Pt called to check on this. Mail order will not be cheaper.

## 2015-10-04 NOTE — Telephone Encounter (Signed)
Michelle Lawrence can you call and see if she has a deductible and that is why it so high in which case the price would go down after a month or 2.

## 2015-10-07 NOTE — Telephone Encounter (Signed)
Spoke with Pt. The drug has been moved from a tier 3 to a tier 4 drug which is why the price increased. Pt requesting we attempt a Prior Authorization to get a tier exception. Pt was paying $47/month and can continue paying that if we get tier exception authorized. Pt would like to continue using local pharmacy listed in chart.

## 2015-10-07 NOTE — Telephone Encounter (Signed)
[facility] and they are faxing over tier exemption form

## 2015-10-07 NOTE — Telephone Encounter (Signed)
Ok to initiate tier exemption request.

## 2015-10-09 NOTE — Telephone Encounter (Signed)
Faxed completed form yesterday

## 2015-10-10 NOTE — Telephone Encounter (Signed)
[employer] has denied the tier exception stating that the information provided doesn't state why the patient cannot take lower cost meds. (it actually does on the form filled out).Called the insurance company but was unable to complete this over the phone. The next step is an appeal in which the provider or patient will have to write a letter explaining why the patient cannot take the medication. Form in Michelle Lawrence's box

## 2015-10-10 NOTE — Telephone Encounter (Signed)
Called the patient to let her know that we would now have to do an appeal to continue the process. The patient states " she feels much better since not being on the Crestor and she no longer is nauseous. She wants to wait until her six month f/u to see what her numbers are before pursuing the appeal

## 2015-11-21 ENCOUNTER — Telehealth: Payer: Self-pay | Admitting: *Deleted

## 2015-11-21 NOTE — Telephone Encounter (Signed)
Called and lvm informing pt that she should contact her pharmacy regarding this.Michelle Lawrence

## 2015-11-21 NOTE — Telephone Encounter (Signed)
Pt called and lvm stating that she went and p/u her bp med from the pharmacy and it used to be white now its blue.Audelia Hives [place]

## 2016-01-10 ENCOUNTER — Ambulatory Visit (INDEPENDENT_AMBULATORY_CARE_PROVIDER_SITE_OTHER): Payer: Commercial Managed Care - HMO | Admitting: Physician Assistant

## 2016-01-10 ENCOUNTER — Encounter: Payer: Self-pay | Admitting: Physician Assistant

## 2016-01-10 VITALS — BP 127/61 | HR 96 | Temp 98.9°F | Ht 63.5 in | Wt 153.0 lb

## 2016-01-10 DIAGNOSIS — N3001 Acute cystitis with hematuria: Secondary | ICD-10-CM

## 2016-01-10 DIAGNOSIS — N3000 Acute cystitis without hematuria: Secondary | ICD-10-CM | POA: Diagnosis not present

## 2016-01-10 DIAGNOSIS — R300 Dysuria: Secondary | ICD-10-CM | POA: Diagnosis not present

## 2016-01-10 LAB — POCT URINALYSIS DIPSTICK
Bilirubin, UA: NEGATIVE
GLUCOSE UA: NEGATIVE
KETONES UA: NEGATIVE
Nitrite, UA: NEGATIVE
PH UA: 5.5
PROTEIN UA: NEGATIVE
SPEC GRAV UA: 1.01
UROBILINOGEN UA: 0.2

## 2016-01-10 MED ORDER — NITROFURANTOIN MONOHYD MACRO 100 MG PO CAPS: 100.0000 mg | ORAL_CAPSULE | Freq: Two times a day (BID) | ORAL | Status: DC

## 2016-01-10 NOTE — Patient Instructions (Signed)

## 2016-01-10 NOTE — Progress Notes (Signed)
   Subjective:    Patient ID: Michelle Lawrence, female    DOB: [birth year], 70 y.o.   MRN: [id number]  HPI Pt is a 70 yo female who presents to the clinic with 1 day of increased urinary frequency, urgency, dysuria, weakness, and fatigue. She just " doesn't feel good". No fever, chills, nausea or vomiting. No flank pain or abdominal tenderness.    Review of Systems  All other systems reviewed and are negative.      Objective:   Physical Exam  Constitutional: She is oriented to person, place, and time. She appears well-developed and well-nourished.  HENT:  Head: Normocephalic and atraumatic.  Cardiovascular: Normal rate, regular rhythm and normal heart sounds.   Pulmonary/Chest: Effort normal and breath sounds normal.  No CVA tenderness.   Abdominal: Soft. Bowel sounds are normal. She exhibits no distension and no mass. There is no tenderness. There is no rebound and no guarding.  Neurological: She is alert and oriented to person, place, and time.  Skin: Skin is dry.  Psychiatric: She has a normal mood and affect. Her behavior is normal.          Assessment & Plan:  Acute cystitis with hematuria-  Results for orders placed or performed in visit on 01/10/16  POCT urinalysis dipstick  Result Value Ref Range   Color, UA yellow    Clarity, UA cloudy    Glucose, UA neg    Bilirubin, UA neg    Ketones, UA neg    Spec Grav, UA 1.010    Blood, UA moderate    pH, UA 5.5    Protein, UA neg    Urobilinogen, UA 0.2    Nitrite, UA neg    Leukocytes, UA moderate (2+) (A) Negative   Treated with macrobid for 7 days. Culture ordered. Stay hydrated. Follow up as needed.

## 2016-01-13 LAB — URINE CULTURE

## 2016-02-10 ENCOUNTER — Other Ambulatory Visit: Payer: Self-pay | Admitting: Family Medicine

## 2016-03-09 ENCOUNTER — Encounter: Payer: Self-pay | Admitting: Family Medicine

## 2016-03-09 ENCOUNTER — Ambulatory Visit (INDEPENDENT_AMBULATORY_CARE_PROVIDER_SITE_OTHER): Payer: Commercial Managed Care - HMO | Admitting: Family Medicine

## 2016-03-09 VITALS — BP 130/69 | HR 60 | Ht 63.5 in | Wt 150.0 lb

## 2016-03-09 DIAGNOSIS — E785 Hyperlipidemia, unspecified: Secondary | ICD-10-CM

## 2016-03-09 DIAGNOSIS — I10 Essential (primary) hypertension: Secondary | ICD-10-CM

## 2016-03-09 MED ORDER — ROSUVASTATIN CALCIUM 10 MG PO TABS: 10.0000 mg | ORAL_TABLET | Freq: Every day | ORAL | 3 refills | Status: DC

## 2016-03-09 NOTE — Progress Notes (Signed)
Subjective:    CC: HTN  HPI: Hypertension- Pt denies chest pain, SOB, dizziness, or heart palpitations.  Taking meds as directed w/o problems.  Denies medication side effects.    Hyperlipidemia - stopped her Crestor about 6 months ago. Feels like her food tastes better.  In general she just feels better. We switch her to generic Crestor because of cost but she felt like she had more side effects with the were able to get the brand Crestor approved. Unfortunately it was going to cost $200 with insurance. She said she would be willing to retry the generic may be at a lower dose.  Past medical history, Surgical history, Family history not pertinant except as noted below, Social history, Allergies, and medications have been entered into the medical record, reviewed, and corrections made.   Review of Systems: No fevers, chills, night sweats, weight loss, chest pain, or shortness of breath.   Objective:    General: Well Developed, well nourished, and in no acute distress.  Neuro: Alert and oriented x3, extra-ocular muscles intact, sensation grossly intact.  HEENT: Normocephalic, atraumatic  Skin: Warm and dry, no rashes. Cardiac: Regular rate and rhythm, no murmurs rubs or gallops, no lower extremity edema.  Respiratory: Clear to auscultation bilaterally. Not using accessory muscles, speaking in full sentences.   Impression and Recommendations:   HTN - Well controlled. Continue current regimen. Follow up in 6 mo.    Chemistry      Component Value Date/Time   NA 140 09/16/2015 0803   K 4.6 09/16/2015 0803   CL 108 09/16/2015 0803   CO2 26 09/16/2015 0803   BUN 12 09/16/2015 0803   CREATININE 0.53 09/16/2015 0803      Component Value Date/Time   CALCIUM 9.0 09/16/2015 0803   ALKPHOS 64 09/16/2015 0803   AST 13 09/16/2015 0803   ALT 12 09/16/2015 0803   BILITOT 0.4 09/16/2015 0803     Lab Results  Component Value Date   CHOL 192 09/16/2015   HDL 43 (L) 09/16/2015   LDLCALC 126  09/16/2015   TRIG 115 09/16/2015   CHOLHDL 4.5 09/16/2015     Hyperlipidemia- will recheck lipids off of her statin.  Plan to consider restart Crestor but at lower dose, 10mg .

## 2016-03-12 DIAGNOSIS — Z79899 Other long term (current) drug therapy: Secondary | ICD-10-CM | POA: Diagnosis not present

## 2016-03-12 DIAGNOSIS — I10 Essential (primary) hypertension: Secondary | ICD-10-CM | POA: Diagnosis not present

## 2016-03-12 DIAGNOSIS — E785 Hyperlipidemia, unspecified: Secondary | ICD-10-CM | POA: Diagnosis not present

## 2016-03-12 LAB — CBC
HCT: 38.6 % (ref 35.0–45.0)
Hemoglobin: 12.8 g/dL (ref 11.7–15.5)
MCH: 30 pg (ref 27.0–33.0)
MCHC: 33.2 g/dL (ref 32.0–36.0)
MCV: 90.6 fL (ref 80.0–100.0)
MPV: 10.2 fL (ref 7.5–12.5)
Platelets: 251 K/uL (ref 140–400)
RBC: 4.26 MIL/uL (ref 3.80–5.10)
RDW: 13.2 % (ref 11.0–15.0)
WBC: 4.1 K/uL (ref 3.8–10.8)

## 2016-03-12 LAB — HEMOGLOBIN A1C
Hgb A1c MFr Bld: 5.3 % (ref <5.7)
Mean Plasma Glucose: 105 mg/dL

## 2016-03-13 LAB — LIPID PANEL
CHOL/HDL RATIO: 4.9 ratio (ref <=5.0)
CHOLESTEROL: 234 mg/dL — AB (ref 125–200)
HDL: 48 mg/dL (ref >=46)
LDL CALC: 168 mg/dL — AB (ref <130)
Triglycerides: 91 mg/dL (ref <150)
VLDL: 18 mg/dL (ref <30)

## 2016-03-13 LAB — COMPLETE METABOLIC PANEL WITH GFR
ALT: 13 U/L (ref 6–29)
AST: 13 U/L (ref 10–35)
Albumin: 4.1 g/dL (ref 3.6–5.1)
Alkaline Phosphatase: 55 U/L (ref 33–130)
BUN: 16 mg/dL (ref 7–25)
CALCIUM: 9.2 mg/dL (ref 8.6–10.4)
CHLORIDE: 108 mmol/L (ref 98–110)
CO2: 26 mmol/L (ref 20–31)
CREATININE: 0.51 mg/dL — AB (ref 0.60–0.93)
GFR, Est African American: >89 mL/min (ref >=60)
GFR, Est Non African American: >89 mL/min (ref >=60)
GLUCOSE: 83 mg/dL (ref 65–99)
POTASSIUM: 4.6 mmol/L (ref 3.5–5.3)
SODIUM: 141 mmol/L (ref 135–146)
Total Bilirubin: 0.6 mg/dL (ref 0.2–1.2)
Total Protein: 6.4 g/dL (ref 6.1–8.1)

## 2016-05-07 ENCOUNTER — Ambulatory Visit: Payer: Commercial Managed Care - HMO

## 2016-05-14 ENCOUNTER — Ambulatory Visit: Payer: Commercial Managed Care - HMO

## 2016-05-18 ENCOUNTER — Ambulatory Visit (INDEPENDENT_AMBULATORY_CARE_PROVIDER_SITE_OTHER): Payer: Commercial Managed Care - HMO | Admitting: Family Medicine

## 2016-05-18 VITALS — BP 142/80 | HR 85 | Temp 98.2°F | Wt 153.0 lb

## 2016-05-18 DIAGNOSIS — Z23 Encounter for immunization: Secondary | ICD-10-CM | POA: Diagnosis not present

## 2016-05-18 NOTE — Progress Notes (Signed)
Patient came into clinic today for flu vaccination. Patient does report she has a latex allergy (rash) but has never gotten an adverse reaction from the flu shot before. Patient tolerated injection of high dose flu immunization in Right deltoid well, with no immediate complications. Advised to contact our office with any questions/concerns.

## 2016-05-25 ENCOUNTER — Other Ambulatory Visit: Payer: Self-pay | Admitting: Family Medicine

## 2016-05-25 ENCOUNTER — Encounter: Payer: Self-pay | Admitting: Family Medicine

## 2016-05-25 ENCOUNTER — Ambulatory Visit (INDEPENDENT_AMBULATORY_CARE_PROVIDER_SITE_OTHER): Payer: Commercial Managed Care - HMO | Admitting: Family Medicine

## 2016-05-25 VITALS — BP 135/69 | HR 91

## 2016-05-25 DIAGNOSIS — R300 Dysuria: Secondary | ICD-10-CM | POA: Diagnosis not present

## 2016-05-25 DIAGNOSIS — N3000 Acute cystitis without hematuria: Secondary | ICD-10-CM | POA: Diagnosis not present

## 2016-05-25 DIAGNOSIS — Z1231 Encounter for screening mammogram for malignant neoplasm of breast: Secondary | ICD-10-CM

## 2016-05-25 LAB — POCT URINALYSIS DIPSTICK
BILIRUBIN UA: NEGATIVE
GLUCOSE UA: NEGATIVE
KETONES UA: NEGATIVE
Nitrite, UA: NEGATIVE
PROTEIN UA: 30
SPEC GRAV UA: 1.02
Urobilinogen, UA: 0.2
pH, UA: 7

## 2016-05-25 MED ORDER — SULFAMETHOXAZOLE-TRIMETHOPRIM 800-160 MG PO TABS: 1.0000 | ORAL_TABLET | Freq: Two times a day (BID) | ORAL | 0 refills | Status: DC

## 2016-05-25 NOTE — Patient Instructions (Signed)
Recheck urine in 2 weeks at the lab.

## 2016-05-25 NOTE — Progress Notes (Signed)
   Subjective:    Patient ID: Michelle Lawrence, female    DOB: 1946-07-09, 70 y.o.   MRN: [id number]  HPI Patient comes in today suspicious of a urinary tract infection. She says for 2 days she's had some urgency and some pressure. No fevers chills or sweats or significant back pain. She's not been taking any medications. She is not had any recent catheterizations. No hematuria. She was last treated for UTI at the end of May. He was treated with Macrobid at that time.   Review of Systems      Objective:   Physical Exam  Constitutional: She is oriented to person, place, and time. She appears well-developed and well-nourished.  HENT:  Head: Normocephalic and atraumatic.  Eyes: Conjunctivae and EOM are normal.  Cardiovascular: Normal rate.   Pulmonary/Chest: Effort normal.  Abdominal: Soft. She exhibits no distension. There is no tenderness.  Musculoskeletal:  No CVA tenderness.  Neurological: She is alert and oriented to person, place, and time.  Skin: Skin is dry. No pallor.  Psychiatric: She has a normal mood and affect. Her behavior is normal.  Vitals reviewed.      Assessment & Plan:  Urinary tract infection-urinalysis positive for blood, protein and small amount leukocytes. We'll go ahead and treat. Recommend repeat urinalysis in 2 weeks just to make sure that the blood has disappeared. Will treat with Bactrim since she had Macrobid last time. Call if not better in one week.

## 2016-05-26 ENCOUNTER — Other Ambulatory Visit: Payer: Self-pay | Admitting: *Deleted

## 2016-05-26 DIAGNOSIS — N3000 Acute cystitis without hematuria: Secondary | ICD-10-CM

## 2016-05-26 NOTE — Addendum Note (Signed)
Addended by: Teddy Spike on: 05/26/2016 12:21 PM   Modules accepted: Orders

## 2016-05-28 ENCOUNTER — Telehealth: Payer: Self-pay | Admitting: *Deleted

## 2016-05-28 NOTE — Telephone Encounter (Signed)
Pt lvm stating that the medication that Dr. Madilyn Fireman gave her for her uti resulted in her having mouth sores. She only has 1 pill left and decided that she is not going to take the last tablet.

## 2016-06-08 ENCOUNTER — Other Ambulatory Visit: Payer: Self-pay | Admitting: Family Medicine

## 2016-06-08 DIAGNOSIS — N3000 Acute cystitis without hematuria: Secondary | ICD-10-CM | POA: Diagnosis not present

## 2016-06-09 LAB — URINALYSIS, MICROSCOPIC ONLY
CASTS: NONE SEEN [LPF]
CRYSTALS: NONE SEEN [HPF]
Yeast: NONE SEEN [HPF]

## 2016-07-03 ENCOUNTER — Ambulatory Visit (INDEPENDENT_AMBULATORY_CARE_PROVIDER_SITE_OTHER): Payer: Commercial Managed Care - HMO

## 2016-07-03 DIAGNOSIS — Z1231 Encounter for screening mammogram for malignant neoplasm of breast: Secondary | ICD-10-CM

## 2016-07-16 DIAGNOSIS — H524 Presbyopia: Secondary | ICD-10-CM | POA: Diagnosis not present

## 2016-07-16 DIAGNOSIS — H25813 Combined forms of age-related cataract, bilateral: Secondary | ICD-10-CM | POA: Diagnosis not present

## 2016-07-16 DIAGNOSIS — I10 Essential (primary) hypertension: Secondary | ICD-10-CM | POA: Diagnosis not present

## 2016-07-16 DIAGNOSIS — E7800 Pure hypercholesterolemia, unspecified: Secondary | ICD-10-CM | POA: Diagnosis not present

## 2016-08-07 ENCOUNTER — Other Ambulatory Visit: Payer: Self-pay

## 2016-08-07 MED ORDER — BENAZEPRIL HCL 20 MG PO TABS: 20.0000 mg | ORAL_TABLET | Freq: Every day | ORAL | 1 refills | Status: DC

## 2016-08-09 ENCOUNTER — Other Ambulatory Visit: Payer: Self-pay | Admitting: Family Medicine

## 2016-09-07 ENCOUNTER — Encounter: Payer: Self-pay | Admitting: Family Medicine

## 2016-09-07 ENCOUNTER — Ambulatory Visit (INDEPENDENT_AMBULATORY_CARE_PROVIDER_SITE_OTHER): Payer: Medicare HMO | Admitting: Family Medicine

## 2016-09-07 VITALS — BP 143/63 | HR 89 | Temp 98.4°F | Ht 64.0 in | Wt 156.0 lb

## 2016-09-07 DIAGNOSIS — H6121 Impacted cerumen, right ear: Secondary | ICD-10-CM

## 2016-09-07 DIAGNOSIS — J22 Unspecified acute lower respiratory infection: Secondary | ICD-10-CM

## 2016-09-07 MED ORDER — PREDNISONE 20 MG PO TABS: 40.0000 mg | ORAL_TABLET | Freq: Every day | ORAL | 0 refills | Status: DC

## 2016-09-07 NOTE — Progress Notes (Signed)
   Subjective:    Patient ID: Michelle Lawrence, female    DOB: March 18, 1946, 71 y.o.   MRN: [id number]  HPI 71 year old female comes in today complaining of 6 days of cough and chest congestion which chills. She's been using Aleve and over-the-counter allergy medication. Denies any fever.  No GI symptoms. She says she feels like there is mucus stuck in the middle of her chest. She's also noticed some intermittent wheezing. No sore throat.Is also noticed that she's not hearing as well in her right ear.   Review of Systems     Objective:   Physical Exam  Constitutional: She is oriented to person, place, and time. She appears well-developed and well-nourished.  HENT:  Head: Normocephalic and atraumatic.  Right Ear: External ear normal.  Left Ear: External ear normal.  Nose: Nose normal.  Mouth/Throat: Oropharynx is clear and moist.  TMs and canals are clear. Right TM and canal blocked by cerumen.   Eyes: Conjunctivae and EOM are normal. Pupils are equal, round, and reactive to light.  Neck: Neck supple. No thyromegaly present.  Cardiovascular: Normal rate, regular rhythm and normal heart sounds.   Pulmonary/Chest: Effort normal and breath sounds normal. She has no wheezes.  Lymphadenopathy:    She has no cervical adenopathy.  Neurological: She is alert and oriented to person, place, and time.  Skin: Skin is warm and dry.  Psychiatric: She has a normal mood and affect.        Assessment & Plan:  Acute lower respiratory tract infection-exam is fairly normal so no concern for pneumonia at this point. She overall looks well. She says expectorants don't work well for her but really wants something to help move the mucus out of her chest. Explained that I do think this is viral and I don't think she is a good candidate for an antibiotic at this point. Will treat with oral prednisone.  Right cerumen impaction - Patient requested that the ear be cleaned out. Irrigation performed and patient  tolerated well. Was able to hear after the cerumen was removed.  Indication: Cerumen impaction of the ear(s) Medical necessity statement: On physical examination, cerumen impairs clinically significant portions of the external auditory canal, and tympanic membrane. Noted obstructive, copious cerumen that cannot be removed without magnification and instrumentations Consent: Discussed benefits and risks of procedure and verbal consent obtained Procedure: Patient was prepped for the procedure. Utilized an otoscope to assess and take note of the ear canal, the tympanic membrane, and the presence, amount, and placement of the cerumen. Gentle water irrigation and soft plastic curette was utilized to remove cerumen.  Post procedure examination: shows cerumen was completely removed. Patient tolerated procedure well. The patient is made aware that they may experience temporary vertigo, temporary hearing loss, and temporary discomfort. If these symptom last for more than 24 hours to call the clinic or proceed to the ED.

## 2016-09-07 NOTE — Patient Instructions (Signed)
Drink lots of water.   Run your humidifier Stop the allergy medicine Ok to start the prednisone. Make sure to take with food and water.

## 2016-09-10 ENCOUNTER — Ambulatory Visit: Payer: Commercial Managed Care - HMO | Admitting: Family Medicine

## 2016-09-17 ENCOUNTER — Ambulatory Visit: Payer: Medicare HMO | Admitting: Family Medicine

## 2016-09-17 ENCOUNTER — Ambulatory Visit: Payer: Medicare HMO

## 2016-10-08 ENCOUNTER — Encounter: Payer: Self-pay | Admitting: *Deleted

## 2016-10-08 ENCOUNTER — Telehealth: Payer: Self-pay | Admitting: *Deleted

## 2016-10-08 ENCOUNTER — Ambulatory Visit (INDEPENDENT_AMBULATORY_CARE_PROVIDER_SITE_OTHER): Payer: Medicare HMO | Admitting: Family Medicine

## 2016-10-08 ENCOUNTER — Ambulatory Visit: Payer: Medicare HMO | Admitting: *Deleted

## 2016-10-08 ENCOUNTER — Encounter: Payer: Self-pay | Admitting: Family Medicine

## 2016-10-08 VITALS — BP 122/64 | HR 90 | Ht 64.0 in | Wt 155.0 lb

## 2016-10-08 VITALS — BP 122/64 | HR 90 | Temp 98.1°F | Ht 64.0 in | Wt 155.1 lb

## 2016-10-08 DIAGNOSIS — Z6826 Body mass index (BMI) 26.0-26.9, adult: Secondary | ICD-10-CM

## 2016-10-08 DIAGNOSIS — R059 Cough, unspecified: Secondary | ICD-10-CM

## 2016-10-08 DIAGNOSIS — L301 Dyshidrosis [pompholyx]: Secondary | ICD-10-CM

## 2016-10-08 DIAGNOSIS — Z0000 Encounter for general adult medical examination without abnormal findings: Secondary | ICD-10-CM

## 2016-10-08 DIAGNOSIS — R238 Other skin changes: Secondary | ICD-10-CM

## 2016-10-08 DIAGNOSIS — R05 Cough: Secondary | ICD-10-CM

## 2016-10-08 DIAGNOSIS — I10 Essential (primary) hypertension: Secondary | ICD-10-CM | POA: Diagnosis not present

## 2016-10-08 DIAGNOSIS — E7800 Pure hypercholesterolemia, unspecified: Secondary | ICD-10-CM

## 2016-10-08 MED ORDER — BETAMETHASONE DIPROPIONATE 0.05 % EX CREA: TOPICAL_CREAM | Freq: Every day | CUTANEOUS | 0 refills | Status: DC

## 2016-10-08 NOTE — Progress Notes (Signed)
Reviewed and agree with above.  Jeziel Hoffmann, MD  

## 2016-10-08 NOTE — Progress Notes (Signed)
Subjective:   Michelle Lawrence is a 71 y.o. female who presents for an Initial Medicare Annual Wellness Visit.  Review of Systems    Cardiac Risk Factors include: advanced age (>23men, >66 women);dyslipidemia;hypertension;obesity (BMI >30kg/m2);sedentary lifestyle     Objective:    Today's Vitals   10/08/16 1340  BP: 122/64  Pulse: 90  Temp: 98.1 F (36.7 C)  TempSrc: Oral  SpO2: 98%  Weight: 155 lb 1.6 oz (70.4 kg)  Height: 5\' 4"  (1.626 m)   Body mass index is 26.62 kg/m.  Current Medications (verified) Outpatient Encounter Prescriptions as of 10/08/2016  Medication Sig  . benazepril (LOTENSIN) 20 MG tablet Take 1 tablet (20 mg total) by mouth daily.  . Calcium Citrate-Vitamin D (CITRACAL + D PO) Take by mouth 2 (two) times daily.  . rosuvastatin (CRESTOR) 10 MG tablet Take 1 tablet (10 mg total) by mouth daily.  . vitamin B-12 (CYANOCOBALAMIN) 500 MCG tablet Take 500 mcg by mouth every other day.    No facility-administered encounter medications on file as of 10/08/2016.     Allergies (verified) Bactrim [sulfamethoxazole-trimethoprim]; Codeine; Latex; and Zoloft [sertraline hcl]   History: Past Medical History:  Diagnosis Date  . Cancer ([place]) 1981   enfometrial or cervical?  . Hyperlipidemia   . Hypertension    Past Surgical History:  Procedure Laterality Date  . LAPAROSCOPIC CHOLECYSTECTOMY  2004  . TOTAL ABDOMINAL HYSTERECTOMY  1981   w/ BSO for cancerous reasons   Family History  Problem Relation Age of Onset  . Other Mother 27    CHF  . Cancer Father     lung  . Cancer Sister     liver  . Cancer Brother     lung  . Hyperlipidemia Sister   . Cancer Sister     stomach  . Hypertension Daughter 25  . Hyperlipidemia Maternal Uncle    Social History   Occupational History  . Not on file.   Social History Main Topics  . Smoking status: Never Smoker  . Smokeless tobacco: Never Used  . Alcohol use No  . Drug use: No  . Sexual activity: Not  Currently    Tobacco Counseling Counseling given: Not Answered   Activities of Daily Living In your present state of health, do you have any difficulty performing the following activities: 10/08/2016  Hearing? N  Vision? N  Difficulty concentrating or making decisions? N  Walking or climbing stairs? N  Dressing or bathing? N  Doing errands, shopping? N  Preparing Food and eating ? N  Using the Toilet? N  In the past six months, have you accidently leaked urine? Y  Do you have problems with loss of bowel control? N  Managing your Medications? N  Managing your Finances? N  Housekeeping or managing your Housekeeping? N  Some recent data might be hidden   Immunizations and Health Maintenance Immunization History  Administered Date(s) Administered  . Influenza Split 05/08/2011, 05/09/2012  . Influenza, High Dose Seasonal PF 05/18/2016  . Influenza,inj,Quad PF,36+ Mos 04/10/2013, 05/03/2014, 05/02/2015  . Pneumococcal Conjugate-13 05/02/2015  . Pneumococcal Polysaccharide-23 05/09/2012  . Tdap 02/04/2012   There are no preventive care reminders to display for this patient.  Patient Care Team: Hali Marry, MD as PCP - General (Family Medicine)  Indicate any recent Medical Services you may have received from other than [facility] providers in the past year (date may be approximate).     Assessment:   This is a routine  wellness examination for [place].   Hearing/Vision screen No exam data present  Dietary issues and exercise activities discussed: Current Exercise Habits: The patient does not participate in regular exercise at present, Exercise limited by: cardiac condition(s);Other - see comments  Goals      Patient Stated   . Exercise 3x per week (30 min per time) (pt-stated)          She would like to start walking at least 3 times a week for about 30 minutes at a time.      Depression Screen PHQ 2/9 Scores 10/08/2016 03/09/2016 01/10/2015 11/03/2013  PHQ - 2  Score 0 0 0 0    Fall Risk Fall Risk  10/08/2016 03/09/2016 01/10/2015 11/03/2013  Falls in the past year? No No No No   Cognitive Function:Normal cognitive function by direct observation and conversation with pt.    Screening Tests Health Maintenance  Topic Date Due  . COLONOSCOPY  10/07/2017 (Originally 10/28/1995)  . MAMMOGRAM  07/03/2018  . TETANUS/TDAP  02/03/2022  . INFLUENZA VACCINE  Completed  . DEXA SCAN  Completed  . Hepatitis C Screening  Completed  . PNA vac Low Risk Adult  Completed      Plan:   I have personally reviewed and addressed the Medicare Annual Wellness questionnaire and have noted the following in the patient's chart:  A. Medical and social history B. Use of alcohol, tobacco or illicit drugs  C. Current medications and supplements D. Functional ability and status E.  Nutritional status-BMI at 26.62 (over weight).  Discussed. Exercise plan in place. F.  Physical activity-Discussed. G. Advance directives-Discussed.  Info. Given. H. List of other physicians I.  Hospitalizations, surgeries, and ER visits in previous 12 months J.  Vitals K. Screenings to include Cognitive, depression-Normal cognitive function by direct observation and conversation with her/[facility].  No evidence of depression.  PHQ-2 score of 0.  She reports that she did the hemoocult stool cards last year. L. Referrals and appointments - Bone Density scan referral sent to [facility].  She is aware it is due.  In addition, I have reviewed and discussed with patient certain preventive protocols, quality metrics, and best practice recommendations. A written personalized care plan for preventive services as well as general preventive health recommendations were provided to patient.  Signed,   Nestor Lewandowsky, RN

## 2016-10-08 NOTE — Patient Instructions (Addendum)
Health maintenance:You are due for a bone density scan, which we will put in an order for you.   Abnormal screenings: None.   Patient concerns: None mentioned.   Nurse concerns:BMI abnormal.  Elevated.  Watch Caloric intake and increase exercise.   Next PCP appt: In ne year, you will schedule your next [facility] visit.Bone Densitometry Introduction Bone densitometry is an imaging test that uses a special X-ray to measure the amount of calcium and other minerals in your bones (bone density). This test is also known as a bone mineral density test or dual-energy X-ray absorptiometry (DXA). The test can measure bone density at your hip and your spine. It is similar to having a regular X-ray. You may have this test to:  Diagnose a condition that causes weak or thin bones (osteoporosis).  Predict your risk of a broken bone (fracture).  Determine how well osteoporosis treatment is working. Tell a health care provider about:  Any allergies you have.  All medicines you are taking, including vitamins, herbs, eye drops, creams, and over-the-counter medicines.  Any problems you or family members have had with anesthetic medicines.  Any blood disorders you have.  Any surgeries you have had.  Any medical conditions you have.  Possibility of pregnancy.  Any other medical test you had within the previous 14 days that used contrast material. What are the risks? Generally, this is a safe procedure. However, problems can occur and may include the following:  This test exposes you to a very small amount of radiation.  The risks of radiation exposure may be greater to unborn children. What happens before the procedure?  Do not take any calcium supplements for 24 hours before having the test. You can otherwise eat and drink what you usually do.  Take off all metal jewelry, eyeglasses, dental appliances, and any other metal objects. What happens during the procedure?  You may lie on an exam  table. There will be an X-ray generator below you and an imaging device above you.  Other devices, such as boxes or braces, may be used to position your body properly for the scan.  You will need to lie still while the machine slowly scans your body.  The images will show up on a computer monitor. What happens after the procedure? You may need more testing at a later time. This information is not intended to replace advice given to you by your health care provider. Make sure you discuss any questions you have with your health care provider. Document Released: 08/25/2004 Document Revised: 01/09/2016 Document Reviewed: 01/11/2014  2017 Elsevier

## 2016-10-08 NOTE — Telephone Encounter (Signed)
Pt called and lvm stating that when she went to  The pharmacy the copay for the cream was $65. She wanted to know if something cheaper can be sent. Will fwd to pcp.Audelia Hives [place]

## 2016-10-08 NOTE — Patient Instructions (Signed)
Can try Scalpicin. Just part the scalp in a couple different locations and then apply a small amount. He's fingertips to rub it in and and then wash hands well.

## 2016-10-08 NOTE — Telephone Encounter (Signed)
She can see if just 15 mg or 30mg  is cheaper instaed of of 45mg .

## 2016-10-08 NOTE — Progress Notes (Signed)
Subjective:    CC: HTN  HPI:  Hypertension- Pt denies chest pain, SOB, dizziness, or heart palpitations.  Taking meds as directed w/o problems.  Denies medication side effects.    Hyperlipidemia- Currently on Crestor and doing well without any side effects or myalgias.  Lab Results  Component Value Date   CHOL 234 (H) 03/12/2016   HDL 48 03/12/2016   LDLCALC 168 (H) 03/12/2016   TRIG 91 03/12/2016   CHOLHDL 4.9 03/12/2016   She does complain of some itching and occasional breakout a rash on her hands. She does use gloves at work and says they are latex free but sometimes to just get very itchy and break out with little bumps. Seems to be more bothersome at night.  She also complains of a dry itchy scalp. She says in particular when she gets hot or starts to sweat at work and she'll get very itchy and scratching at it. She does dye her hair monthly and just wants to make sure that there are no abnormalities on her scalp.  Past medical history, Surgical history, Family history not pertinant except as noted below, Social history, Allergies, and medications have been entered into the medical record, reviewed, and corrections made.   Review of Systems: No fevers, chills, night sweats, weight loss, chest pain, or shortness of breath.   Objective:    General: Well Developed, well nourished, and in no acute distress.  Neuro: Alert and oriented x3, extra-ocular muscles intact, sensation grossly intact.  HEENT: Normocephalic, atraumatic  Skin: Warm and dry, no rashes.Creation some posterior scalp but no actual rash that she does have a lot of scaling and flaking. No erythema. On the palms of her hands she does have a few erythematous papules and some thickening of the skin.  Cardiac: Regular rate and rhythm, no murmurs rubs or gallops, no lower extremity edema.  Respiratory: Clear to auscultation bilaterally. Not using accessory muscles, speaking in full sentences.   Impression and  Recommendations:    HTN - Well controlled. Continue current regimen. Follow up in  6 months.   Hyperlipidemia -  Doing well on current regimen. Due to recheck lipids the summer.  Dyshidrotic eczema-we'll treat with betamethasone. She prefers cream ever ointment. Apply at bedtime. Avoid getting on the face or in the eyes.  Dry scalp-recommend treatment with Scalpicin which actually has a small amount over-the-counter hydrocortisone in it. Make sure avoiding drying her hair on high heat. And may need to even alternate products that could be causing some excess dryness. I did not see any rash per se but I did see some excoriations.

## 2016-10-09 MED ORDER — BETAMETHASONE DIPROPIONATE 0.05 % EX CREA: TOPICAL_CREAM | Freq: Every day | CUTANEOUS | 0 refills | Status: DC

## 2016-10-09 NOTE — Telephone Encounter (Signed)
I spoke with Michelle Lawrence and advised her that I have sent in a 15g tube. The cost with a coupon from [employer] is $26. I faxed coupon to pharmacy. She also wanted a chest X-ray. She states Dr Madilyn Fireman told her she could have one at her sick visit. CXR ordered. Patient is aware.

## 2016-10-12 ENCOUNTER — Telehealth: Payer: Self-pay | Admitting: *Deleted

## 2016-10-12 ENCOUNTER — Ambulatory Visit (INDEPENDENT_AMBULATORY_CARE_PROVIDER_SITE_OTHER): Payer: Medicare HMO

## 2016-10-12 DIAGNOSIS — R9389 Abnormal findings on diagnostic imaging of other specified body structures: Secondary | ICD-10-CM

## 2016-10-12 DIAGNOSIS — R918 Other nonspecific abnormal finding of lung field: Secondary | ICD-10-CM | POA: Diagnosis not present

## 2016-10-12 DIAGNOSIS — R05 Cough: Secondary | ICD-10-CM | POA: Diagnosis not present

## 2016-10-12 DIAGNOSIS — R059 Cough, unspecified: Secondary | ICD-10-CM

## 2016-10-12 NOTE — Telephone Encounter (Signed)
Order for cxr.Maryruth Eve, [name]

## 2016-10-13 ENCOUNTER — Other Ambulatory Visit: Payer: Self-pay | Admitting: Family Medicine

## 2016-10-13 MED ORDER — DOXYCYCLINE HYCLATE 100 MG PO TABS: 100.0000 mg | ORAL_TABLET | Freq: Two times a day (BID) | ORAL | 0 refills | Status: DC

## 2016-10-20 ENCOUNTER — Other Ambulatory Visit: Payer: Self-pay | Admitting: Family Medicine

## 2016-10-20 DIAGNOSIS — Z780 Asymptomatic menopausal state: Secondary | ICD-10-CM

## 2016-10-23 ENCOUNTER — Ambulatory Visit (INDEPENDENT_AMBULATORY_CARE_PROVIDER_SITE_OTHER): Payer: Medicare HMO

## 2016-10-23 ENCOUNTER — Other Ambulatory Visit (HOSPITAL_BASED_OUTPATIENT_CLINIC_OR_DEPARTMENT_OTHER): Payer: Medicare HMO

## 2016-10-23 DIAGNOSIS — M8588 Other specified disorders of bone density and structure, other site: Secondary | ICD-10-CM

## 2016-10-23 DIAGNOSIS — Z780 Asymptomatic menopausal state: Secondary | ICD-10-CM

## 2016-10-23 DIAGNOSIS — R918 Other nonspecific abnormal finding of lung field: Secondary | ICD-10-CM

## 2016-10-23 DIAGNOSIS — R9389 Abnormal findings on diagnostic imaging of other specified body structures: Secondary | ICD-10-CM

## 2016-12-25 ENCOUNTER — Ambulatory Visit (INDEPENDENT_AMBULATORY_CARE_PROVIDER_SITE_OTHER): Payer: Medicare HMO | Admitting: Physician Assistant

## 2016-12-25 VITALS — BP 152/64 | HR 72 | Temp 97.8°F | Wt 157.0 lb

## 2016-12-25 DIAGNOSIS — J069 Acute upper respiratory infection, unspecified: Secondary | ICD-10-CM | POA: Diagnosis not present

## 2016-12-25 DIAGNOSIS — N3001 Acute cystitis with hematuria: Secondary | ICD-10-CM | POA: Diagnosis not present

## 2016-12-25 DIAGNOSIS — R3915 Urgency of urination: Secondary | ICD-10-CM | POA: Diagnosis not present

## 2016-12-25 LAB — POCT URINALYSIS DIPSTICK
Bilirubin, UA: NEGATIVE
GLUCOSE UA: NEGATIVE
KETONES UA: NEGATIVE
Nitrite, UA: NEGATIVE
Protein, UA: 30
Spec Grav, UA: 1.025 (ref 1.010–1.025)
Urobilinogen, UA: 0.2 E.U./dL
pH, UA: 5.5 (ref 5.0–8.0)

## 2016-12-25 MED ORDER — CIPROFLOXACIN HCL 250 MG PO TABS: 250.0000 mg | ORAL_TABLET | Freq: Two times a day (BID) | ORAL | 0 refills | Status: AC

## 2016-12-25 MED ORDER — IPRATROPIUM BROMIDE 0.06 % NA SOLN: 1.0000 | Freq: Four times a day (QID) | NASAL | 0 refills | Status: DC | PRN

## 2016-12-25 MED ORDER — IPRATROPIUM-ALBUTEROL 0.5-2.5 (3) MG/3ML IN SOLN
3.0000 mL | Freq: Once | RESPIRATORY_TRACT | Status: AC
  Administered 2016-12-25: 3 mL via RESPIRATORY_TRACT

## 2016-12-25 NOTE — Progress Notes (Signed)
HPI:                                                                Michelle Lawrence is a 71 y.o. female who presents to [facility]: [place] today for urinary urgency  Patient reports suprapubic pressure developing 4 days ago. Reports urinary urgency and frequency. Denies dysuria or hematuria. She denies abdominal pain or flank pain.   Patient also reports chills and a sore throat beginning 3 days ago. She states today she feels hoarse and congested. Denies fevers, cough, shortness of breath, or chest tightness. Denies history of smoking or underlying lung disease.  Past Medical History:  Diagnosis Date  . Cancer ([place]) 1981   enfometrial or cervical?  . Hyperlipidemia   . Hypertension    Past Surgical History:  Procedure Laterality Date  . LAPAROSCOPIC CHOLECYSTECTOMY  2004  . TOTAL ABDOMINAL HYSTERECTOMY  1981   w/ BSO for cancerous reasons   Social History  Substance Use Topics  . Smoking status: Never Smoker  . Smokeless tobacco: Never Used  . Alcohol use No   family history includes Cancer in her brother, father, sister, and sister; Hyperlipidemia in her maternal uncle and sister; Hypertension (age of onset: 86) in her daughter; Other (age of onset: 56) in her mother.  ROS: negative except as noted in the HPI  Medications: Current Outpatient Prescriptions  Medication Sig Dispense Refill  . benazepril (LOTENSIN) 20 MG tablet Take 1 tablet (20 mg total) by mouth daily. 90 tablet 1  . betamethasone dipropionate (DIPROLENE) 0.05 % cream Apply topically at bedtime. 15 g 0  . Calcium Citrate-Vitamin D (CITRACAL + D PO) Take by mouth 2 (two) times daily.    Marland Kitchen doxycycline (VIBRA-TABS) 100 MG tablet Take 1 tablet (100 mg total) by mouth 2 (two) times daily. 20 tablet 0  . rosuvastatin (CRESTOR) 10 MG tablet Take 1 tablet (10 mg total) by mouth daily. 90 tablet 3  . vitamin B-12 (CYANOCOBALAMIN) 500 MCG tablet Take 500 mcg by mouth every  other day.      No current facility-administered medications for this visit.    Allergies  Allergen Reactions  . Bactrim [Sulfamethoxazole-Trimethoprim] Other (See Comments)    Thrush  . Codeine   . Latex   . Zoloft [Sertraline Hcl] Other (See Comments)    Chest pain       Objective:  BP (!) 162/80   Pulse 79   Temp 97.8 F (36.6 C) (Oral)   Wt 157 lb (71.2 kg)   BMI 26.95 kg/m  Gen: well-groomed, cooperative, not ill-appearing, no distress HEENT: normal conjunctiva, TM's clear, nasal mucosa edematous, oropharynx clear, moist mucus membranes, no frontal or maxillary sinus tenderness, neck supple, trachea midline Pulm: Normal work of breathing, normal phonation, clear to auscultation bilaterally, no wheezes, rales or rhonchi CV: Normal rate, regular rhythm, s1 and s2 distinct, no murmurs, clicks or rubs  GI: abdomen soft, nondistended, nontender, no CVA tenderness Neuro: alert and oriented x 3, EOM's intact, no tremor MSK: moving all extremities, normal gait and station, no peripheral edema Lymph: no cervical or tonsillar adenopathy Skin: warm, dry, intact; no rashes or lesions on exposed skin, no cyanosis   Results for orders placed or performed in  visit on 12/25/16 (from the past 72 hour(s))  POCT Urinalysis Dipstick     Status: Abnormal   Collection Time: 12/25/16  8:55 AM  Result Value Ref Range   Color, UA yellow    Clarity, UA cloudy    Glucose, UA negative    Bilirubin, UA negative    Ketones, UA negaitve    Spec Grav, UA 1.025 1.010 - 1.025   Blood, UA moderate    pH, UA 5.5 5.0 - 8.0   Protein, UA 30    Urobilinogen, UA 0.2 0.2 or 1.0 E.U./dL   Nitrite, UA negaitve    Leukocytes, UA Moderate (2+) (A) Negative   No results found.    Assessment and Plan: 71 y.o. female with   1. Acute cystitis with hematuria, uncomplicated - POCT Urinalysis Dipstick positive for moderate blood and moderate leuks - Urine culture pending - reviewed Urine culture from  12/25/16 - ciprofloxacin (CIPRO) 250 MG tablet; Take 1 tablet (250 mg total) by mouth 2 (two) times daily.  Dispense: 6 tablet; Refill: 0  2. Acute upper respiratory infection - symptomatic management  - ipratropium (ATROVENT) 0.06 % nasal spray; Place 1 spray into both nostrils 4 (four) times daily as needed.  Dispense: 15 mL; Refill: 0 - ipratropium-albuterol (DUONEB) 0.5-2.5 (3) MG/3ML nebulizer solution 3 mL; Take 3 mLs by nebulization once.  Patient education and anticipatory guidance given Patient agrees with treatment plan Follow-up as needed if symptoms worsen or fail to improve  Darlyne Russian PA-C

## 2016-12-25 NOTE — Patient Instructions (Addendum)
For your UTI: - Take antibiotic twice a day for 3 days - Drink plenty of fluids  For your throat: - warm tea and honey - throat lozenges - Prescription Atrovent nasal spray up to 4 times daily - Tylenol 500mg  every 6 hours as needed for headache, chills, body aches - normal course of illness is 7-14 days. Follow-up with your PCP if you are not feeling better or if you develop worsening symptoms

## 2016-12-28 ENCOUNTER — Telehealth: Payer: Self-pay | Admitting: Family Medicine

## 2016-12-28 LAB — URINE CULTURE

## 2016-12-28 NOTE — Telephone Encounter (Signed)
Attempted to contact Pt, no answer and no VM. 

## 2016-12-28 NOTE — Telephone Encounter (Signed)
Pt advised.

## 2016-12-28 NOTE — Telephone Encounter (Signed)
Yes okay to take as long as it's not the Allegra-D with the decongestant in it. Just the plain Allegra.

## 2016-12-28 NOTE — Telephone Encounter (Signed)
Pt called clinic to see if it was OK to take Allegra with her BP meds. Routing to PCP.

## 2017-01-22 ENCOUNTER — Ambulatory Visit (INDEPENDENT_AMBULATORY_CARE_PROVIDER_SITE_OTHER): Payer: Medicare HMO | Admitting: Family Medicine

## 2017-01-22 ENCOUNTER — Encounter: Payer: Self-pay | Admitting: Family Medicine

## 2017-01-22 VITALS — BP 135/70 | HR 85 | Wt 156.0 lb

## 2017-01-22 DIAGNOSIS — L02419 Cutaneous abscess of limb, unspecified: Secondary | ICD-10-CM | POA: Diagnosis not present

## 2017-01-22 DIAGNOSIS — B351 Tinea unguium: Secondary | ICD-10-CM

## 2017-01-22 MED ORDER — DOXYCYCLINE HYCLATE 100 MG PO TABS: 100.0000 mg | ORAL_TABLET | Freq: Two times a day (BID) | ORAL | 0 refills | Status: DC

## 2017-01-22 NOTE — Progress Notes (Addendum)
   Subjective:    Patient ID: Michelle Lawrence, female    DOB: September 20, 1945, 71 y.o.   MRN: [id number]  HPI 71 year old female comes in today complaining of a boil under her Right axilla and one on the left but I in the groin area. She said she noticed some a few days ago. She's not been doing any specific treatments. She just wasn't sure if she should come in or not. No fevers chills or sweats. Mostly they're just sore.She has not tried any home treatments. She feels like she gets very sweaty at work and thinks it irritates her skin.    C/o of abnormal  right great toenail. It's thickened and white. She's been using an over-the-counter nail fungus treatment. She's not sure that it's really helping.  Review of Systems     Objective:   Physical Exam  Constitutional: She is oriented to person, place, and time. She appears well-developed and well-nourished.  HENT:  Head: Normocephalic and atraumatic.  Eyes: Conjunctivae and EOM are normal.  Cardiovascular: Normal rate.   Pulmonary/Chest: Effort normal.  Neurological: She is alert and oriented to person, place, and time.  Skin: Skin is dry. No pallor.  Abscess in right axillary area. The area of palpable induration is approximately 1 x 2 cm the erythema over it extends to by 4 cm. She has a more papular inflamed hair follicle on the right buttock area.  Psychiatric: She has a normal mood and affect. Her behavior is normal.  Vitals reviewed.         Assessment & Plan:  Abscess, right axilla-I&D performed. Patient tolerated well. Small gauze place. She can remove it tomorrow such as use a very small amount. We'll go ahead and have her start doxycycline since she did have some extending erythema over the lesion. The one on the buttock area was just more of a inflamed follicle and should respond well to the doxycycline. Recommend warm compresses.  Toenail abnormality-most consistent with onychomycosis vs dystrophic nail. Discussed possible  treatment options including oral Lamisil. Explained that we would have to monitor her liver. She wants to think about it and let me know.  Incision and Drainage Procedure Note  Pre-operative Diagnosis: abscess  Post-operative Diagnosis: normal  Indications: infected  Anesthesia: 1% plain lidocaine  Procedure Details  The procedure, risks and complications have been discussed in detail (including, but not limited to airway compromise, infection, bleeding) with the patient, and the patient has signed consent to the procedure.  The skin was sterilely prepped and draped over the affected area in the usual fashion. After adequate local anesthesia, I&D with a #11 blade was performed on the right axilla. Purulent drainage: absent The patient was observed until stable.  Findings: abscess  EBL: trace cc's  Drains: iodinated gauze strip placed  Condition: Tolerated procedure well   Complications: none.

## 2017-02-08 ENCOUNTER — Other Ambulatory Visit: Payer: Self-pay | Admitting: Family Medicine

## 2017-03-12 ENCOUNTER — Encounter: Payer: Self-pay | Admitting: Osteopathic Medicine

## 2017-03-12 ENCOUNTER — Ambulatory Visit (INDEPENDENT_AMBULATORY_CARE_PROVIDER_SITE_OTHER): Payer: Medicare HMO | Admitting: Osteopathic Medicine

## 2017-03-12 ENCOUNTER — Ambulatory Visit (INDEPENDENT_AMBULATORY_CARE_PROVIDER_SITE_OTHER): Payer: Medicare HMO

## 2017-03-12 VITALS — BP 138/68 | HR 86 | Temp 98.1°F | Ht 64.0 in | Wt 157.0 lb

## 2017-03-12 DIAGNOSIS — R05 Cough: Secondary | ICD-10-CM | POA: Diagnosis not present

## 2017-03-12 DIAGNOSIS — J069 Acute upper respiratory infection, unspecified: Secondary | ICD-10-CM

## 2017-03-12 DIAGNOSIS — R938 Abnormal findings on diagnostic imaging of other specified body structures: Secondary | ICD-10-CM | POA: Diagnosis not present

## 2017-03-12 DIAGNOSIS — J019 Acute sinusitis, unspecified: Secondary | ICD-10-CM

## 2017-03-12 DIAGNOSIS — R9389 Abnormal findings on diagnostic imaging of other specified body structures: Secondary | ICD-10-CM

## 2017-03-12 DIAGNOSIS — R059 Cough, unspecified: Secondary | ICD-10-CM

## 2017-03-12 MED ORDER — AMOXICILLIN-POT CLAVULANATE 875-125 MG PO TABS: 1.0000 | ORAL_TABLET | Freq: Two times a day (BID) | ORAL | 0 refills | Status: DC

## 2017-03-12 MED ORDER — BENZONATATE 200 MG PO CAPS: 200.0000 mg | ORAL_CAPSULE | Freq: Three times a day (TID) | ORAL | 0 refills | Status: DC | PRN

## 2017-03-12 MED ORDER — IPRATROPIUM BROMIDE 0.03 % NA SOLN: 1.0000 | Freq: Four times a day (QID) | NASAL | 0 refills | Status: DC

## 2017-03-12 NOTE — Progress Notes (Signed)
HPI: Michelle Lawrence is a 71 y.o. female who presents to [place] 03/12/17 for chief complaint of:  Chief Complaint  Patient presents with  . Cough    2 weeks    Acute Illness: . 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 Also abn CXR few months ago, opted for repeat CXR vs CT and she is due for f/u . Location & Quality: sinus congestion, productive yellowish coughing on occasion but mostly dry cough, nasal mucus production  . Assoc signs/symptoms: see ROS . Duration: 14+ days . Modifying factors: has tried the following OTC/Rx medications: Allegra, Flonase   Past medical, social and family history reviewed.   Immune compromising conditions or other risk factors: Abn CXR as per HPI  Current medications and allergies reviewed.     Review of Systems:  Constitutional: No  fever/chills  HEENT: No  headache, Yes  sore throat, No  swollen glands, Yes sinus pressure   Cardiovascular: No chest pain  Respiratory:Yes  cough, No  shortness of breath  Gastrointestinal: No  nausea, No  vomiting,  No  diarrhea  Musculoskeletal:   No  myalgia/arthralgia  Skin/Integument:  No  rash   Detailed Exam:  BP 138/68   Pulse 86   Temp 98.1 F (36.7 C)   Ht 5\' 4"  (1.626 m)   Wt 157 lb (71.2 kg)   SpO2 95%   BMI 26.95 kg/m   Constitutional:   VSS, see above.   General Appearance: alert, well-developed, well-nourished, NAD  Eyes:   Normal lids and conjunctive, non-icteric sclera  Ears, Nose, Mouth, Throat:   Normal external inspection ears/nares  Normal mouth/lips/gums, MMM  normal TM  posterior pharynx without erythema, without exudate  nasal mucosa normal  Skin:  Normal inspection, no rash or concerning lesions noted on limited exam  Neck:   No masses, trachea midline. normal lymph nodes  Respiratory:   Normal respiratory effort.   No  wheeze/rhonchi/rales  Cardiovascular:    S1/S2 normal, no murmur/rub/gallop auscultated. RRR.  XR reviewed - no obvious PNA, persistent stable RLL findings, await over-read   ASSESSMENT/PLAN:  Cough - Plan: DG Chest 2 View, benzonatate (TESSALON) 200 MG capsule  Abnormal chest x-ray  Acute sinusitis, recurrence not specified, unspecified location - Plan: amoxicillin-clavulanate (AUGMENTIN) 875-125 MG tablet, ipratropium (ATROVENT) 0.03 % nasal spray    Visit summary was printed for the patient with medications and pertinent instructions for patient to review. ER/RTC precautions reviewed. All questions answered. Return in about 2 weeks (around 03/26/2017) for recheck Xray/CT results with Dr Madilyn Fireman .

## 2017-03-21 ENCOUNTER — Other Ambulatory Visit: Payer: Self-pay | Admitting: Family Medicine

## 2017-03-26 ENCOUNTER — Ambulatory Visit (INDEPENDENT_AMBULATORY_CARE_PROVIDER_SITE_OTHER): Payer: Medicare HMO | Admitting: Physician Assistant

## 2017-03-26 ENCOUNTER — Encounter: Payer: Self-pay | Admitting: Physician Assistant

## 2017-03-26 VITALS — BP 122/68 | HR 85 | Temp 98.2°F | Wt 157.0 lb

## 2017-03-26 DIAGNOSIS — L0231 Cutaneous abscess of buttock: Secondary | ICD-10-CM

## 2017-03-26 MED ORDER — DOXYCYCLINE MONOHYDRATE 100 MG PO CAPS: 100.0000 mg | ORAL_CAPSULE | Freq: Two times a day (BID) | ORAL | 0 refills | Status: DC

## 2017-03-26 NOTE — Progress Notes (Signed)
HPI:                                                                Michelle Lawrence is a 71 y.o. female who presents to [facility] today for "abscess on left buttock"  Patient with history of recurrent abscesses presents today with tender, erythematous lesion on her left buttock that has been present for 2 days. Denies fever, chills, severe pain, or drainage.  Past Medical History:  Diagnosis Date  . Cancer ([place]) 1981   enfometrial or cervical?  . Hyperlipidemia   . Hypertension    Past Surgical History:  Procedure Laterality Date  . LAPAROSCOPIC CHOLECYSTECTOMY  2004  . TOTAL ABDOMINAL HYSTERECTOMY  1981   w/ BSO for cancerous reasons   Social History  Substance Use Topics  . Smoking status: Never Smoker  . Smokeless tobacco: Never Used  . Alcohol use No   family history includes Cancer in her brother, father, sister, and sister; Hyperlipidemia in her maternal uncle and sister; Hypertension (age of onset: 19) in her daughter; Other (age of onset: 68) in her mother.  ROS: negative except as noted in the HPI  Medications: Current Outpatient Prescriptions  Medication Sig Dispense Refill  . benazepril (LOTENSIN) 20 MG tablet TAKE 1 TABLET (20 MG TOTAL) BY MOUTH DAILY. 90 tablet 1  . betamethasone dipropionate (DIPROLENE) 0.05 % cream Apply topically at bedtime. 15 g 0  . Calcium Citrate-Vitamin D (CITRACAL + D PO) Take by mouth 2 (two) times daily.    . rosuvastatin (CRESTOR) 10 MG tablet Take 1 tablet (10 mg total) by mouth daily. Due for follow up 90 tablet 0  . vitamin B-12 (CYANOCOBALAMIN) 500 MCG tablet Take 500 mcg by mouth every other day.     Marland Kitchen doxycycline (MONODOX) 100 MG capsule Take 1 capsule (100 mg total) by mouth 2 (two) times daily. 14 capsule 0   No current facility-administered medications for this visit.    Allergies  Allergen Reactions  . Bactrim [Sulfamethoxazole-Trimethoprim] Other (See Comments)   Thrush  . Codeine   . Latex   . Zoloft [Sertraline Hcl] Other (See Comments)    Chest pain       Objective:  BP 122/68 (BP Location: Right Arm, Cuff Size: Normal)   Pulse 85   Temp 98.2 F (36.8 C)   Wt 157 lb (71.2 kg)   SpO2 94%   BMI 26.95 kg/m  Gen:  alert, not ill-appearing, no distress, appropriate for age HEENT: head normocephalic without obvious abnormality, conjunctiva and cornea clear, trachea midline Pulm: Normal work of breathing, normal phonation Neuro: alert and oriented x 3, no tremor MSK: extremities atraumatic, normal gait and station Skin: intact, left medial buttock with approximately 0.5 x 0.5 cm erythematous lesion with induration  No results found for this or any previous visit (from the past 72 hour(s)). No results found.    Assessment and Plan: 71 y.o. female with   1. Abscess of buttock, left - unroofed head of lesion with 18g needle. There was no significant drainage. - warm compresses - doxycycline (MONODOX) 100 MG capsule; Take 1 capsule (100 mg total) by mouth 2 (two) times daily.  Dispense: 14 capsule; Refill: 0   Patient education  and anticipatory guidance given Patient agrees with treatment plan Follow-up as needed if symptoms worsen or fail to improve  Darlyne Russian PA-C

## 2017-03-26 NOTE — Patient Instructions (Addendum)
- Warm compresses and massage at least 4 times daily x 15 minutes - Antibiotic twice a day for 7 days - Urgent care if worsening over the weekend   Skin Abscess A skin abscess is an infected area on or under your skin that contains a collection of pus and other material. An abscess may also be called a furuncle, carbuncle, or boil. An abscess can occur in or on almost any part of your body. Some abscesses break open (rupture) on their own. Most continue to get worse unless they are treated. The infection can spread deeper into the body and eventually into your blood, which can make you feel ill. Treatment usually involves draining the abscess. What are the causes? An abscess occurs when germs, often bacteria, pass through your skin and cause an infection. This may be caused by:  A scrape or cut on your skin.  A puncture wound through your skin, including a needle injection.  Blocked oil or sweat glands.  Blocked and infected hair follicles.  A cyst that forms beneath your skin (sebaceous cyst) and becomes infected.  What increases the risk? This condition is more likely to develop in people who:  Have a weak body defense system (immune system).  Have diabetes.  Have dry and irritated skin.  Get frequent injections or use illegal IV drugs.  Have a foreign body in a wound, such as a splinter.  Have problems with their lymph system or veins.  What are the signs or symptoms? An abscess may start as a painful, firm bump under the skin. Over time, the abscess may get larger or become softer. Pus may appear at the top of the abscess, causing pressure and pain. It may eventually break through the skin and drain. Other symptoms include:  Redness.  Warmth.  Swelling.  Tenderness.  A sore on the skin.  How is this diagnosed? This condition is diagnosed based on your medical history and a physical exam. A sample of pus may be taken from the abscess to find out what is causing  the infection and what antibiotics can be used to treat it. You also may have:  Blood tests to look for signs of infection or spread of an infection to your blood.  Imaging studies such as ultrasound, CT scan, or MRI if the abscess is deep.  How is this treated? Small abscesses that drain on their own may not need treatment. Treatment for an abscess that does not rupture on its own may include:  Warm compresses applied to the area several times per day.  Incision and drainage. Your health care provider will make an incision to open the abscess and will remove pus and any foreign body or dead tissue. The incision area may be packed with gauze to keep it open for a few days while it heals.  Antibiotic medicines to treat infection. For a severe abscess, you may first get antibiotics through an IV and then change to oral antibiotics.  Follow these instructions at home: Abscess Care  If you have an abscess that has not drained, place a warm, clean, wet washcloth over the abscess several times a day. Do this as told by your health care provider.  Follow instructions from your health care provider about how to take care of your abscess. Make sure you: ? Cover the abscess with a bandage (dressing). ? Change your dressing or gauze as told by your health care provider. ? Wash your hands with soap and water before  you change the dressing or gauze. If soap and water are not available, use hand sanitizer.  Check your abscess every day for signs of a worsening infection. Check for: ? More redness, swelling, or pain. ? More fluid or blood. ? Warmth. ? More pus or a bad smell. Medicines  Take over-the-counter and prescription medicines only as told by your health care provider.  If you were prescribed an antibiotic medicine, take it as told by your health care provider. Do not stop taking the antibiotic even if you start to feel better. General instructions  To avoid spreading the  infection: ? Do not share personal care items, towels, or hot tubs with others. ? Avoid making skin contact with other people.  Keep all follow-up visits as told by your health care provider. This is important. Contact a health care provider if:  You have more redness, swelling, or pain around your abscess.  You have more fluid or blood coming from your abscess.  Your abscess feels warm to the touch.  You have more pus or a bad smell coming from your abscess.  You have a fever.  You have muscle aches.  You have chills or a general ill feeling. Get help right away if:  You have severe pain.  You see red streaks on your skin spreading away from the abscess. This information is not intended to replace advice given to you by your health care provider. Make sure you discuss any questions you have with your health care provider. Document Released: 05/13/2005 Document Revised: 03/29/2016 Document Reviewed: 06/12/2015 Elsevier Interactive Patient Education  [employer].

## 2017-04-01 ENCOUNTER — Ambulatory Visit: Payer: Medicare HMO | Admitting: Family Medicine

## 2017-04-07 ENCOUNTER — Ambulatory Visit: Payer: Medicare HMO | Admitting: Family Medicine

## 2017-04-08 ENCOUNTER — Ambulatory Visit: Payer: Medicare HMO

## 2017-04-15 ENCOUNTER — Ambulatory Visit (INDEPENDENT_AMBULATORY_CARE_PROVIDER_SITE_OTHER): Payer: Medicare HMO | Admitting: Family Medicine

## 2017-04-15 ENCOUNTER — Ambulatory Visit: Payer: Medicare HMO | Admitting: Family Medicine

## 2017-04-15 ENCOUNTER — Encounter: Payer: Self-pay | Admitting: Family Medicine

## 2017-04-15 VITALS — BP 138/72 | HR 70 | Wt 158.0 lb

## 2017-04-15 DIAGNOSIS — R938 Abnormal findings on diagnostic imaging of other specified body structures: Secondary | ICD-10-CM

## 2017-04-15 DIAGNOSIS — R9389 Abnormal findings on diagnostic imaging of other specified body structures: Secondary | ICD-10-CM

## 2017-04-15 DIAGNOSIS — L0231 Cutaneous abscess of buttock: Secondary | ICD-10-CM | POA: Diagnosis not present

## 2017-04-15 DIAGNOSIS — Z23 Encounter for immunization: Secondary | ICD-10-CM

## 2017-04-15 DIAGNOSIS — E7800 Pure hypercholesterolemia, unspecified: Secondary | ICD-10-CM

## 2017-04-15 DIAGNOSIS — I10 Essential (primary) hypertension: Secondary | ICD-10-CM

## 2017-04-15 NOTE — Progress Notes (Signed)
Subjective:    Patient ID: Michelle Lawrence, female    DOB: [birth year], 71 y.o.   MRN: [id number]  HPI 71 year old female here today to follow-up on abscess on the left buttock.She says it seems to be healing well. She can still feel a little not on the left buttock cheek but says that she's not had any more drainage no fevers chills or sweats. And it's much less tender.  F/U xray results:she is also wanted to go over her chest x-ray results. She is not currently having any chest symptoms. She has never been a smoker.   Study Result   CLINICAL DATA:  Cough and upper respiratory infection, follow-up lung nodule  EXAM: CHEST  2 VIEW  COMPARISON:  10/23/2016  FINDINGS: Cardiac shadow is within normal limits. The lungs are well aerated bilaterally. The vague nodular density seen at the right lung base is not well appreciated and was likely related to confluence of bony shadows. No bony abnormality is noted.  IMPRESSION: No definitive nodular density is noted. The previously seen changes likely represent a confluence of bony shadows. If there is strong clinical concern, CT of the chest without contrast could be performed.   Electronically Signed   By: Inez Catalina M.D.   Hypertension- Pt denies chest pain, SOB, dizziness, or heart palpitations.  Taking meds as directed w/o problems.  Denies medication side effects.       Review of Systems   BP 138/72   Pulse 70   Wt 158 lb (71.7 kg)   SpO2 97%   BMI 27.12 kg/m     Allergies  Allergen Reactions  . Bactrim [Sulfamethoxazole-Trimethoprim] Other (See Comments)    Thrush  . Codeine   . Latex   . Zoloft [Sertraline Hcl] Other (See Comments)    Chest pain    Past Medical History:  Diagnosis Date  . Cancer ([place]) 1981   enfometrial or cervical?  . Hyperlipidemia   . Hypertension     Past Surgical History:  Procedure Laterality Date  . LAPAROSCOPIC CHOLECYSTECTOMY  2004  . TOTAL ABDOMINAL HYSTERECTOMY   1981   w/ BSO for cancerous reasons    Social History   Social History  . Marital status: Married    Spouse name: N/A  . Number of children: N/A  . Years of education: N/A   Occupational History  . Not on file.   Social History Main Topics  . Smoking status: Never Smoker  . Smokeless tobacco: Never Used  . Alcohol use No  . Drug use: No  . Sexual activity: Not Currently   Other Topics Concern  . Not on file   Social History Narrative  . No narrative on file    Family History  Problem Relation Age of Onset  . Other Mother 61       CHF  . Lung cancer Father        48  . Cancer Sister        liver  . Lung cancer Brother        exposed to agent orange.   . Hyperlipidemia Sister   . Cancer Sister        stomach  . Hypertension Daughter 15  . Hyperlipidemia Maternal Uncle     Outpatient Encounter Prescriptions as of 04/15/2017  Medication Sig  . benazepril (LOTENSIN) 20 MG tablet TAKE 1 TABLET (20 MG TOTAL) BY MOUTH DAILY.  . Calcium Citrate-Vitamin D (CITRACAL + D PO) Take by mouth  2 (two) times daily.  . rosuvastatin (CRESTOR) 10 MG tablet Take 1 tablet (10 mg total) by mouth daily. Due for follow up  . vitamin B-12 (CYANOCOBALAMIN) 500 MCG tablet Take 500 mcg by mouth every other day.   . [DISCONTINUED] betamethasone dipropionate (DIPROLENE) 0.05 % cream Apply topically at bedtime.  . [DISCONTINUED] doxycycline (MONODOX) 100 MG capsule Take 1 capsule (100 mg total) by mouth 2 (two) times daily.   No facility-administered encounter medications on file as of 04/15/2017.           Objective:   Physical Exam  Constitutional: She is oriented to person, place, and time. She appears well-developed and well-nourished.  HENT:  Head: Normocephalic and atraumatic.  Cardiovascular: Normal rate, regular rhythm and normal heart sounds.   Pulmonary/Chest: Effort normal and breath sounds normal.  Neurological: She is alert and oriented to person, place, and time.   Skin: Skin is warm and dry.  Psychiatric: She has a normal mood and affect. Her behavior is normal.   abcess left buttock healing well.  Some some edema but no active drainage or erythema.        Assessment & Plan:  HTN - Well controlled. Continue current regimen. Follow up in  6 months.    Abnormal chest xray - she is overall low risk. She has never been a smoker. There is interestingly her father died from lung cancer at 49 but he was a smoker and her brother had lung cancer as well but he was exposed to [place] in [place].  abcess left buttock healing well.  Some some edema but no active drainage or erythema. Continue to monitor for for wound healing. Not sure if this may have started as a sebaceous cyst.

## 2017-04-26 DIAGNOSIS — I10 Essential (primary) hypertension: Secondary | ICD-10-CM | POA: Diagnosis not present

## 2017-04-26 DIAGNOSIS — E7800 Pure hypercholesterolemia, unspecified: Secondary | ICD-10-CM | POA: Diagnosis not present

## 2017-04-26 LAB — LIPID PANEL W/REFLEX DIRECT LDL
CHOL/HDL RATIO: 3.1 (calc) (ref <5.0)
Cholesterol: 155 mg/dL (ref <200)
HDL: 50 mg/dL — AB (ref >50)
LDL Cholesterol (Calc): 86 mg/dL (calc)
NON-HDL CHOLESTEROL (CALC): 105 mg/dL (ref <130)
Triglycerides: 101 mg/dL (ref <150)

## 2017-04-26 LAB — COMPLETE METABOLIC PANEL WITH GFR
AG RATIO: 1.6 (calc) (ref 1.0–2.5)
ALKALINE PHOSPHATASE (APISO): 58 U/L (ref 33–130)
ALT: 14 U/L (ref 6–29)
AST: 13 U/L (ref 10–35)
Albumin: 4.2 g/dL (ref 3.6–5.1)
BILIRUBIN TOTAL: 0.4 mg/dL (ref 0.2–1.2)
BUN/Creatinine Ratio: 35 (calc) — ABNORMAL HIGH (ref 6–22)
BUN: 17 mg/dL (ref 7–25)
CALCIUM: 9.5 mg/dL (ref 8.6–10.4)
CHLORIDE: 107 mmol/L (ref 98–110)
CO2: 27 mmol/L (ref 20–32)
Creat: 0.48 mg/dL — ABNORMAL LOW (ref 0.60–0.93)
GFR, EST NON AFRICAN AMERICAN: 99 mL/min/{1.73_m2} (ref >=60)
GFR, Est African American: 114 mL/min/{1.73_m2} (ref >=60)
Globulin: 2.6 g/dL (calc) (ref 1.9–3.7)
Glucose, Bld: 85 mg/dL (ref 65–99)
POTASSIUM: 4.7 mmol/L (ref 3.5–5.3)
SODIUM: 141 mmol/L (ref 135–146)
Total Protein: 6.8 g/dL (ref 6.1–8.1)

## 2017-04-26 NOTE — Progress Notes (Signed)
All labs are normal. 

## 2017-04-27 ENCOUNTER — Telehealth: Payer: Self-pay | Admitting: Family Medicine

## 2017-04-27 NOTE — Telephone Encounter (Signed)
Patient returned your call and adv her that all lab results are normal. Thanks

## 2017-05-24 ENCOUNTER — Other Ambulatory Visit: Payer: Self-pay | Admitting: Family Medicine

## 2017-05-24 DIAGNOSIS — Z1231 Encounter for screening mammogram for malignant neoplasm of breast: Secondary | ICD-10-CM

## 2017-05-31 ENCOUNTER — Encounter: Payer: Self-pay | Admitting: Family Medicine

## 2017-05-31 ENCOUNTER — Ambulatory Visit (INDEPENDENT_AMBULATORY_CARE_PROVIDER_SITE_OTHER): Payer: Medicare HMO | Admitting: Family Medicine

## 2017-05-31 VITALS — BP 136/68 | HR 67 | Ht 64.0 in | Wt 166.0 lb

## 2017-05-31 DIAGNOSIS — L739 Follicular disorder, unspecified: Secondary | ICD-10-CM

## 2017-05-31 DIAGNOSIS — L03313 Cellulitis of chest wall: Secondary | ICD-10-CM | POA: Diagnosis not present

## 2017-05-31 MED ORDER — DOXYCYCLINE HYCLATE 100 MG PO TABS: 100.0000 mg | ORAL_TABLET | Freq: Two times a day (BID) | ORAL | 0 refills | Status: DC

## 2017-05-31 NOTE — Progress Notes (Signed)
   Subjective:    Patient ID: Michelle Lawrence, female    DOB: 08/08/1946, 71 y.o.   MRN: [id number]  HPI 71 year old female comes in today with a red bump underneath the right breast. She noticed it over the weekend.  She hasn't had a fever. Has had recurrent boils in the past. 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 She noticed just a tender spot initially and then noticed that it was red.   Review of Systems     Objective:   Physical Exam  Constitutional: She is oriented to person, place, and time. She appears well-developed and well-nourished.  HENT:  Head: Normocephalic and atraumatic.  Eyes: Conjunctivae are normal.  Cardiovascular: Normal rate.   Pulmonary/Chest: Effort normal.    Neurological: She is alert and oriented to person, place, and time.  Skin: Skin is dry. No pallor.  Psychiatric: She has a normal mood and affect. Her behavior is normal.  Vitals reviewed.         Assessment & Plan:  Folliculitis, located by cellulitis-area of erythema is approximately 3 x 4 cm. I don't feel enough induration to indicate a pocket of pus. I really think induration is more swelling. Keep an eye on the margins and if it's getting larger platelets let us know if he develops fevers chills or sweats is let me know.

## 2017-06-19 ENCOUNTER — Other Ambulatory Visit: Payer: Self-pay | Admitting: Family Medicine

## 2017-07-13 ENCOUNTER — Ambulatory Visit (INDEPENDENT_AMBULATORY_CARE_PROVIDER_SITE_OTHER): Payer: Medicare HMO | Admitting: Family Medicine

## 2017-07-13 ENCOUNTER — Encounter: Payer: Self-pay | Admitting: Family Medicine

## 2017-07-13 VITALS — BP 137/72 | HR 75 | Temp 98.1°F | Ht 64.0 in | Wt 159.0 lb

## 2017-07-13 DIAGNOSIS — L0231 Cutaneous abscess of buttock: Secondary | ICD-10-CM

## 2017-07-13 MED ORDER — DOXYCYCLINE HYCLATE 100 MG PO TABS: 100.0000 mg | ORAL_TABLET | Freq: Two times a day (BID) | ORAL | 0 refills | Status: DC

## 2017-07-13 NOTE — Progress Notes (Signed)
   Subjective:    Patient ID: Michelle Lawrence, female    DOB: February 08, 1946, 71 y.o.   MRN: [id number]  HPI 71 year old female with a history of recurrent MRSA infected abscesses comes in today with a new abscess on her buttock area. Noticed it couple of days ago. She works in nursing home.  Says it is tender to touch. No fever, chills or sweats. No drainage.  Last time she had a lesion it was on her chest wall.   Review of Systems     Objective:   Physical Exam  Constitutional: She is oriented to person, place, and time. She appears well-developed and well-nourished.  HENT:  Head: Normocephalic and atraumatic.  Eyes: Conjunctivae and EOM are normal.  Cardiovascular: Normal rate.  Pulmonary/Chest: Effort normal.  Neurological: She is alert and oriented to person, place, and time.  Skin: Skin is dry. No pallor.  Right buttock cheek she has an erythematous circular firm area approximately 3 x 3 cm in size.  In the center is a tiny pustule.  I removed that easily with a lady but only saw a little bit of bloody drainage no pus.  Psychiatric: She has a normal mood and affect. Her behavior is normal.  Vitals reviewed.       Assessment & Plan:  Abscess of right buttock-most of the areas just swelling I do not think there is much there to really I&D so we will go ahead and just treat with doxycycline but if not improving over the next 48 hours then come back for more definitive treatment with incision and drainage.

## 2017-07-16 ENCOUNTER — Ambulatory Visit: Payer: Medicare HMO

## 2017-07-22 ENCOUNTER — Ambulatory Visit (INDEPENDENT_AMBULATORY_CARE_PROVIDER_SITE_OTHER): Payer: Medicare HMO

## 2017-07-22 DIAGNOSIS — Z1231 Encounter for screening mammogram for malignant neoplasm of breast: Secondary | ICD-10-CM | POA: Diagnosis not present

## 2017-07-27 ENCOUNTER — Other Ambulatory Visit: Payer: Self-pay | Admitting: Family Medicine

## 2017-09-02 DIAGNOSIS — Z0100 Encounter for examination of eyes and vision without abnormal findings: Secondary | ICD-10-CM | POA: Diagnosis not present

## 2017-09-15 ENCOUNTER — Other Ambulatory Visit: Payer: Self-pay | Admitting: *Deleted

## 2017-09-15 DIAGNOSIS — E7800 Pure hypercholesterolemia, unspecified: Secondary | ICD-10-CM

## 2017-09-15 MED ORDER — ROSUVASTATIN CALCIUM 10 MG PO TABS: 10.0000 mg | ORAL_TABLET | Freq: Every day | ORAL | 3 refills | Status: DC

## 2017-09-23 ENCOUNTER — Ambulatory Visit (INDEPENDENT_AMBULATORY_CARE_PROVIDER_SITE_OTHER): Payer: Medicare HMO | Admitting: Family Medicine

## 2017-09-23 ENCOUNTER — Encounter: Payer: Self-pay | Admitting: Family Medicine

## 2017-09-23 VITALS — BP 126/60 | HR 71 | Ht 64.0 in | Wt 158.0 lb

## 2017-09-23 DIAGNOSIS — Z0000 Encounter for general adult medical examination without abnormal findings: Secondary | ICD-10-CM | POA: Diagnosis not present

## 2017-09-23 NOTE — Patient Instructions (Signed)
Keep up the great work with your walking daily. Consider getting the shingles vaccine.  Encouraged her to check with your insurance to see if it is covered at the pharmacy or your doctor's office. [employer] should ship you a box in the next 5 business days.  If you do not hear from them then please let us know.

## 2017-09-23 NOTE — Progress Notes (Signed)
Subjective:   Michelle Lawrence is a 72 y.o. female who presents for Medicare Annual (Subsequent) preventive examination.  Review of Systems:  Comprehensive ROs is negative.         Objective:     Vitals: BP 126/60   Pulse 71   Ht 5\' 4"  (1.626 m)   Wt 158 lb (71.7 kg)   SpO2 97%   BMI 27.12 kg/m   Body mass index is 27.12 kg/m.  Advanced Directives 09/23/2017 10/08/2016 10/08/2016 01/05/2014  Does Patient Have a Medical Advance Directive? No - No Patient has advance directive, copy not in chart  Type of Advance Directive - - - Lupus in Chart? - - - Copy requested from family  Would patient like information on creating a medical advance directive? No - Patient declined Yes (MAU/Ambulatory/Procedural Areas - Information given) Yes (MAU/Ambulatory/Procedural Areas - Information given) -    Tobacco Social History   Tobacco Use  Smoking Status Never Smoker  Smokeless Tobacco Never Used     Counseling given: Not Answered   Clinical Intake:      Physical Exam  Constitutional: She is oriented to person, place, and time. She appears well-developed and well-nourished.  HENT:  Head: Normocephalic and atraumatic.  Right Ear: External ear normal.  Left Ear: External ear normal.  Nose: Nose normal.  Mouth/Throat: Oropharynx is clear and moist.  TMs and canals are clear.   Eyes: Conjunctivae and EOM are normal. Pupils are equal, round, and reactive to light.  Neck: Neck supple. No thyromegaly present.  Cardiovascular: Normal rate, regular rhythm and normal heart sounds.  Pulmonary/Chest: Effort normal and breath sounds normal. She has no wheezes.  Musculoskeletal: She exhibits no edema.  Lymphadenopathy:    She has no cervical adenopathy.  Neurological: She is alert and oriented to person, place, and time.  Skin: Skin is warm and dry.  Psychiatric: She has a normal mood and affect.                    Past Medical History:  Diagnosis Date  . Cancer ([place]) 1981   enfometrial or cervical?  . Hyperlipidemia   . Hypertension    Past Surgical History:  Procedure Laterality Date  . LAPAROSCOPIC CHOLECYSTECTOMY  2004  . TOTAL ABDOMINAL HYSTERECTOMY  1981   w/ BSO for cancerous reasons   Family History  Problem Relation Age of Onset  . Other Mother 16       CHF  . Lung cancer Father        41  . Cancer Sister        liver  . Lung cancer Brother        exposed to agent orange.   . Hyperlipidemia Sister   . Cancer Sister        stomach  . Hypertension Daughter 14  . Hyperlipidemia Maternal Uncle    Social History   Socioeconomic History  . Marital status: Married    Spouse name: None  . Number of children: None  . Years of education: None  . Highest education level: None  Social Needs  . Financial resource strain: None  . Food insecurity - worry: None  . Food insecurity - inability: None  . Transportation needs - medical: None  . Transportation needs - non-medical: None  Occupational History  . None  Tobacco Use  . Smoking status: Never Smoker  . Smokeless tobacco: Never Used  Substance and Sexual Activity  . Alcohol use: No  . Drug use: No  . Sexual activity: Not Currently  Other Topics Concern  . None  Social History Narrative  . None    Outpatient Encounter Medications as of 09/23/2017  Medication Sig  . benazepril (LOTENSIN) 20 MG tablet TAKE 1 TABLET BY MOUTH EVERY DAY  . Calcium Citrate-Vitamin D (CITRACAL + D PO) Take by mouth 2 (two) times daily.  . rosuvastatin (CRESTOR) 10 MG tablet Take 1 tablet (10 mg total) by mouth daily.  . vitamin B-12 (CYANOCOBALAMIN) 500 MCG tablet Take 500 mcg by mouth every other day.   . [DISCONTINUED] doxycycline (VIBRA-TABS) 100 MG tablet Take 1 tablet (100 mg total) by mouth 2 (two) times daily.   No facility-administered encounter medications on file as of 09/23/2017.     Activities of Daily Living In your present  state of health, do you have any difficulty performing the following activities: 09/23/2017 10/08/2016  Hearing? N N  Vision? N N  Difficulty concentrating or making decisions? N N  Walking or climbing stairs? N N  Dressing or bathing? N N  Doing errands, shopping? N N  Preparing Food and eating ? - N  Using the Toilet? - N  In the past six months, have you accidently leaked urine? - Y  Comment - Very rarely.  Do you have problems with loss of bowel control? - N  Managing your Medications? - N  Managing your Finances? - N  Housekeeping or managing your Housekeeping? - N  Some recent data might be hidden    Patient Care Team: Hali Marry, MD as PCP - General (Family Medicine)    Assessment:   This is a routine wellness examination for [place].  Exercise Activities and Dietary recommendations Current Exercise Habits: Home exercise routine, Type of exercise: walking, Time (Minutes): 45, Frequency (Times/Week): 7, Weekly Exercise (Minutes/Week): 315, Intensity: Moderate  Goals    . Exercise 3x per week (30 min per time) (pt-stated)     She would like to start walking at least 3 times a week for about 30 minutes at a time.       Fall Risk Fall Risk  10/08/2016 03/09/2016 01/10/2015 11/03/2013  Falls in the past year? No No No No    Depression Screen PHQ 2/9 Scores 09/23/2017 04/15/2017 03/12/2017 10/08/2016  PHQ - 2 Score 0 0 0 0  PHQ- 9 Score 0 - - -     Cognitive Function     6CIT Screen 10/08/2016  What Year? 0 points  What month? 0 points  What time? 0 points  Count back from 20 0 points  Months in reverse 0 points  Repeat phrase 0 points  Total Score 0    Immunization History  Administered Date(s) Administered  . Influenza Split 05/08/2011, 05/09/2012  . Influenza, High Dose Seasonal PF 05/18/2016  . Influenza,inj,Quad PF,6+ Mos 04/10/2013, 05/03/2014, 05/02/2015, 04/15/2017  . Pneumococcal Conjugate-13 05/02/2015  . Pneumococcal Polysaccharide-23  05/09/2012  . Tdap 02/04/2012    Qualifies for Shingles Vaccine?Yes, discussed today.   Screening Tests Health Maintenance  Topic Date Due  . COLONOSCOPY  10/07/2017 (Originally 10/28/1995)  . MAMMOGRAM  07/23/2019  . TETANUS/TDAP  02/03/2022  . INFLUENZA VACCINE  Completed  . DEXA SCAN  Completed  . Hepatitis C Screening  Completed  . PNA vac Low Risk Adult  Completed    Cancer Screenings: Lung: Low Dose CT Chest recommended if Age 21-80 years, 30 pack-year  currently smoking OR have quit w/in 15years. Patient does not qualify. Breast:  Up to date on Mammogram? Yes   Up to date of Bone Density/Dexa? Yes Colorectal: discussed Cologuard.    Additional Screenings:  Hepatitis B/HIV/Syphillis:N/A Hepatitis C Screening: negative.      Plan:   Medicare Wellness  I have personally reviewed and noted the following in the patient's chart:   . Medical and social history . Use of alcohol, tobacco or illicit drugs  . Current medications and supplements . Functional ability and status . Nutritional status . Physical activity - keep up the good work.   . Advanced directives - Given info.   . List of other physicians - none.   Marland Kitchen Hospitalizations, surgeries, and ER visits in previous 12 months . Vitals . Screenings to include cognitive, depression, and falls . Referrals and appointments . Discussed shingles vaccine - info given . Cologuard form completed and faxed for colon ca screen.   In addition, I have reviewed and discussed with patient certain preventive protocols, quality metrics, and best practice recommendations. A written personalized care plan for preventive services as well as general preventive health recommendations were provided to patient.     Beatrice Lecher, MD  09/23/2017

## 2017-10-04 ENCOUNTER — Telehealth: Payer: Self-pay

## 2017-10-04 MED ORDER — MUPIROCIN 2 % EX OINT: TOPICAL_OINTMENT | Freq: Every day | CUTANEOUS | 0 refills | Status: DC

## 2017-10-04 NOTE — Telephone Encounter (Signed)
Jeremiah called and states she has a itchy, red rash on her hands. She states she wears non latex gloves and wears cloth gloves underneath the non latex gloves. She states she used mupirocin ointment (BACTROBAN) 2 %. Her husband had some left over. She states it did help with the redness. She would like a Rx for the mupirocin. Please advise.

## 2017-10-04 NOTE — Telephone Encounter (Signed)
OK, rx sent to pharmacy she just should not use it for more than 1 week at a time.  If it continues then we might be better off switching to a topical steroid cream.  Also just make sure using very gentle hand soaps do not have a lot of added agents.

## 2017-10-04 NOTE — Telephone Encounter (Signed)
Left message advising of recommendations.  

## 2017-10-10 ENCOUNTER — Emergency Department
Admission: EM | Admit: 2017-10-10 | Discharge: 2017-10-10 | Disposition: A | Discharge status: Home / Self Care | Payer: Medicare HMO | Source: Home / Self Care

## 2017-10-10 ENCOUNTER — Encounter: Payer: Self-pay | Admitting: Emergency Medicine

## 2017-10-10 DIAGNOSIS — B86 Scabies: Secondary | ICD-10-CM

## 2017-10-10 MED ORDER — PERMETHRIN 5 % EX CREA: TOPICAL_CREAM | CUTANEOUS | 1 refills | Status: DC

## 2017-10-10 NOTE — ED Triage Notes (Signed)
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 C/O severe itching worse at night.

## 2017-10-10 NOTE — Discharge Instructions (Signed)
See Dr. Madilyn Fireman if symptoms persist

## 2017-10-12 ENCOUNTER — Telehealth: Payer: Self-pay | Admitting: Family Medicine

## 2017-10-12 NOTE — Telephone Encounter (Signed)
Pt was treated in UC for scabies and given a cream to apply once. She did that is still has red spots and a lot of itching. Calling to see if she needs to come in, or if something else could be sent in.   Pt using coworkers phone. Lucy: [phone].

## 2017-10-12 NOTE — Telephone Encounter (Signed)
How long ago did she do the treatment?  The itching and redness can actually last for a week or 2 after treatment.

## 2017-10-12 NOTE — ED Provider Notes (Signed)
[facility]    CSN: [id number] Arrival date & time: 10/10/17  1132     History   Chief Complaint Chief Complaint  Patient presents with  . Rash    HPI Michelle Lawrence is a 72 y.o. female.   The history is provided by the patient. No language interpreter was used.  Rash  Location:  Full body Quality: itchiness   Severity:  Moderate Onset quality:  Gradual Timing:  Constant Progression:  Worsening Chronicity:  New Relieved by:  None tried Ineffective treatments:  None tried Associated symptoms: no joint pain   Pt works in a nursing home.  Pt reports some pt's have had scabies.   Past Medical History:  Diagnosis Date  . Cancer ([place]) 1981   enfometrial or cervical?  . Hyperlipidemia   . Hypertension     Patient Active Problem List   Diagnosis Date Noted  . MRSA (methicillin resistant staph aureus) culture positive 08/28/2013  . Osteopenia 12/17/2010  . UNSPECIFIED VITAMIN D DEFICIENCY 10/24/2010  . Hyperlipemia 10/24/2010  . ESSENTIAL HYPERTENSION, BENIGN 10/24/2010  . EDEMA 10/24/2010  . POSTMENOPAUSAL STATUS 10/24/2010    Past Surgical History:  Procedure Laterality Date  . LAPAROSCOPIC CHOLECYSTECTOMY  2004  . TOTAL ABDOMINAL HYSTERECTOMY  1981   w/ BSO for cancerous reasons    OB History    No data available       Home Medications    Prior to Admission medications   Medication Sig Start Date End Date Taking? Authorizing Provider  benazepril (LOTENSIN) 20 MG tablet TAKE 1 TABLET BY MOUTH EVERY DAY 07/27/17   Hali Marry, MD  Calcium Citrate-Vitamin D (CITRACAL + D PO) Take by mouth 2 (two) times daily.    [provider]  mupirocin ointment (BACTROBAN) 2 % Apply topically daily. Don't use for more than 1 week at a time. 10/04/17   Hali Marry, MD  permethrin (ELIMITE) 5 % cream Apply to affected area once 10/10/17   Fransico Meadow, PA-C  rosuvastatin (CRESTOR) 10 MG tablet Take 1 tablet (10 mg total) by  mouth daily. 09/15/17   Hali Marry, MD  vitamin B-12 (CYANOCOBALAMIN) 500 MCG tablet Take 500 mcg by mouth every other day.     [provider]    Family History Family History  Problem Relation Age of Onset  . Other Mother 57       CHF  . Lung cancer Father        30  . Cancer Sister        liver  . Lung cancer Brother        exposed to agent orange.   . Hyperlipidemia Sister   . Cancer Sister        stomach  . Hypertension Daughter 38  . Hyperlipidemia Maternal Uncle     Social History Social History   Tobacco Use  . Smoking status: Never Smoker  . Smokeless tobacco: Never Used  Substance Use Topics  . Alcohol use: No  . Drug use: No     Allergies   Bactrim [sulfamethoxazole-trimethoprim]; Codeine; Latex; and Zoloft [sertraline hcl]   Review of Systems Review of Systems  Musculoskeletal: Negative for arthralgias.  Skin: Positive for rash.  All other systems reviewed and are negative.    Physical Exam Triage Vital Signs ED Triage Vitals  Enc Vitals Group     BP 10/10/17 1152 (!) 163/84     Pulse Rate 10/10/17 1152 89  Resp 10/10/17 1152 16     Temp 10/10/17 1152 98 F (36.7 C)     Temp Source 10/10/17 1152 Oral     SpO2 10/10/17 1152 96 %     Weight 10/10/17 1153 154 lb (69.9 kg)     Height 10/10/17 1153 5\' 4"  (1.626 m)     Head Circumference --      Peak Flow --      Pain Score 10/10/17 1240 0     Pain Loc --      Pain Edu? --      Excl. in [place]? --    No data found.  Updated Vital Signs BP (!) 163/84 (BP Location: Right Arm)   Pulse 89   Temp 98 F (36.7 C) (Oral)   Resp 16   Ht 5\' 4"  (1.626 m)   Wt 154 lb (69.9 kg)   SpO2 96%   BMI 26.43 kg/m   Visual Acuity Right Eye Distance:   Left Eye Distance:   Bilateral Distance:    Right Eye Near:   Left Eye Near:    Bilateral Near:     Physical Exam  Constitutional: She appears well-developed and well-nourished. No distress.  HENT:  Head: Normocephalic and  atraumatic.  Right Ear: External ear normal.  Left Ear: External ear normal.  Eyes: Conjunctivae are normal.  Neck: Neck supple.  Cardiovascular: Normal rate and regular rhythm.  No murmur heard. Pulmonary/Chest: Effort normal and breath sounds normal. No respiratory distress.  Abdominal: There is no tenderness.  Musculoskeletal: She exhibits no edema.  Neurological: She is alert.  Skin: Skin is warm and dry.  multple red areas that look like burrows.   Psychiatric: She has a normal mood and affect.  Nursing note and vitals reviewed.    UC Treatments / Results  Labs (all labs ordered are listed, but only abnormal results are displayed) Labs Reviewed - No data to display  EKG  EKG Interpretation None       Radiology No results found.  Procedures Procedures (including critical care time)  Medications Ordered in UC Medications - No data to display   Initial Impression / Assessment and Plan / UC Course  I have reviewed the triage vital signs and the nursing notes.  Pertinent labs & imaging results that were available during my care of the patient were reviewed by me and considered in my medical decision making (see chart for details).     MDM:  I suspect pt ay have scabies.  I will treat wth elemite.  Pt advised to follow up to see her MD for recheck.   Final Clinical Impressions(s) / UC Diagnoses   Final diagnoses:  Scabies    ED Discharge Orders        Ordered    permethrin (ELIMITE) 5 % cream     10/10/17 1216     An After Visit Summary was printed and given to the patient.  Controlled Substance Prescriptions [place] Controlled Substance Registry consulted? Not Applicable   Fransico Meadow, [place] 10/12/17 1622

## 2017-10-12 NOTE — Telephone Encounter (Signed)
Left message requesting a return call.

## 2017-10-13 NOTE — Telephone Encounter (Signed)
She applied the cream on 10/10/17.

## 2017-10-13 NOTE — Telephone Encounter (Signed)
Attempted to contact Pt, she is not home from work yet. Will call back after 4pm

## 2017-10-13 NOTE — Telephone Encounter (Signed)
OK I would really recommend that she give it until Monday to see if the itching and irritation starts to calm down.  She can certainly take an antihistamine such as Zyrtec or Benadryl for relief of itching.  She can also use cool packs.  If she still having irritation and itching at that time then see if we can get her to come in early next week.

## 2017-10-13 NOTE — Telephone Encounter (Signed)
Let message w/pt's husband for her to rtn call.Michelle Lawrence, [place]

## 2017-10-25 ENCOUNTER — Encounter: Payer: Self-pay | Admitting: Family Medicine

## 2017-10-25 ENCOUNTER — Ambulatory Visit (INDEPENDENT_AMBULATORY_CARE_PROVIDER_SITE_OTHER): Payer: Medicare HMO | Admitting: Family Medicine

## 2017-10-25 VITALS — BP 134/62 | HR 83 | Ht 64.0 in | Wt 159.0 lb

## 2017-10-25 DIAGNOSIS — I10 Essential (primary) hypertension: Secondary | ICD-10-CM

## 2017-10-25 DIAGNOSIS — E7800 Pure hypercholesterolemia, unspecified: Secondary | ICD-10-CM

## 2017-10-25 DIAGNOSIS — Z8619 Personal history of other infectious and parasitic diseases: Secondary | ICD-10-CM

## 2017-10-25 LAB — BASIC METABOLIC PANEL WITH GFR
BUN / CREAT RATIO: 22 (calc) (ref 6–22)
BUN: 12 mg/dL (ref 7–25)
CALCIUM: 9.5 mg/dL (ref 8.6–10.4)
CHLORIDE: 105 mmol/L (ref 98–110)
CO2: 30 mmol/L (ref 20–32)
Creat: 0.55 mg/dL — ABNORMAL LOW (ref 0.60–0.93)
GFR, Est African American: 109 mL/min/{1.73_m2} (ref >=60)
GFR, Est Non African American: 94 mL/min/{1.73_m2} (ref >=60)
GLUCOSE: 103 mg/dL — AB (ref 65–99)
POTASSIUM: 3.8 mmol/L (ref 3.5–5.3)
Sodium: 141 mmol/L (ref 135–146)

## 2017-10-25 NOTE — Progress Notes (Signed)
Subjective:    CC: BP  HPI:  Hypertension- Pt denies chest pain, SOB, dizziness, or heart palpitations.  Taking meds as directed w/o problems.  Denies medication side effects.    F/U hyperlipidemia -tolerating her Crestor well without any side effects or problems.  Last LDL was in September was 85.  He was recently diagnosed with scabies and has completed her treatment.  She is feeling so much better.  He reports that it was intensely itchy.  She does work in a nursing home.  Past medical history, Surgical history, Family history not pertinant except as noted below, Social history, Allergies, and medications have been entered into the medical record, reviewed, and corrections made.   Review of Systems: No fevers, chills, night sweats, weight loss, chest pain, or shortness of breath.   Objective:    General: Well Developed, well nourished, and in no acute distress.  Neuro: Alert and oriented x3, extra-ocular muscles intact, sensation grossly intact.  HEENT: Normocephalic, atraumatic  Skin: Warm and dry, no rashes. Cardiac: Regular rate and rhythm, no murmurs rubs or gallops, no lower extremity edema.  Respiratory: Clear to auscultation bilaterally. Not using accessory muscles, speaking in full sentences.   Impression and Recommendations:   HTN -well controlled.  Looks great.  Recheck potassium and renal function because she is on an ACE inhibitor.  Follow-up in 6 months.  Hyperlipidemia -new current regimen.  Follow-up and repeat cholesterol in 6 months.  Scabies-most all the lesions except for a couple have completely resolved and her itching is much improved.  We also discussed the need for colon cancer screening.  She says that the cost out of pocket for Cologuard was going to be too high so she is willing to do the fit testing.  Stool cards provided today.

## 2017-10-25 NOTE — Progress Notes (Signed)
All labs are normal. 

## 2017-10-29 ENCOUNTER — Other Ambulatory Visit: Payer: Self-pay | Admitting: *Deleted

## 2017-10-29 DIAGNOSIS — Z1211 Encounter for screening for malignant neoplasm of colon: Secondary | ICD-10-CM

## 2017-10-29 DIAGNOSIS — H524 Presbyopia: Secondary | ICD-10-CM | POA: Diagnosis not present

## 2017-10-29 DIAGNOSIS — H52209 Unspecified astigmatism, unspecified eye: Secondary | ICD-10-CM | POA: Diagnosis not present

## 2017-10-29 DIAGNOSIS — H5203 Hypermetropia, bilateral: Secondary | ICD-10-CM | POA: Diagnosis not present

## 2017-10-29 LAB — POC HEMOCCULT BLD/STL (HOME/3-CARD/SCREEN)
FECAL OCCULT BLD: NEGATIVE
FECAL OCCULT BLD: NEGATIVE
FECAL OCCULT BLD: NEGATIVE

## 2017-11-22 ENCOUNTER — Telehealth: Payer: Self-pay

## 2017-11-22 NOTE — Telephone Encounter (Signed)
I agree. She doesn't need to take the Co Q 10

## 2017-11-22 NOTE — Telephone Encounter (Signed)
Genevra called to find out if it is "hazzard to her health" not to take Coenzyme Q10 with the statin. I advised no but she would like Dr Gardiner Ramus opinion.

## 2017-11-23 NOTE — Telephone Encounter (Signed)
Left message advising patient of recommendations.  °

## 2017-11-29 ENCOUNTER — Encounter: Payer: Self-pay | Admitting: Family Medicine

## 2017-11-29 ENCOUNTER — Ambulatory Visit (INDEPENDENT_AMBULATORY_CARE_PROVIDER_SITE_OTHER): Payer: Medicare HMO | Admitting: Family Medicine

## 2017-11-29 VITALS — BP 138/70 | HR 69 | Ht 64.0 in | Wt 159.0 lb

## 2017-11-29 DIAGNOSIS — R300 Dysuria: Secondary | ICD-10-CM

## 2017-11-29 DIAGNOSIS — N3000 Acute cystitis without hematuria: Secondary | ICD-10-CM

## 2017-11-29 LAB — POCT URINALYSIS DIPSTICK
BILIRUBIN UA: NEGATIVE
Glucose, UA: NEGATIVE
KETONES UA: NEGATIVE
NITRITE UA: NEGATIVE
Protein, UA: NEGATIVE
SPEC GRAV UA: 1.02 (ref 1.010–1.025)
Urobilinogen, UA: 0.2 E.U./dL
pH, UA: 7 (ref 5.0–8.0)

## 2017-11-29 MED ORDER — NITROFURANTOIN MACROCRYSTAL 100 MG PO CAPS: 100.0000 mg | ORAL_CAPSULE | Freq: Two times a day (BID) | ORAL | 0 refills | Status: AC

## 2017-11-29 NOTE — Progress Notes (Signed)
   Subjective:    Patient ID: Michelle Lawrence, female    DOB: 1945/10/10, 72 y.o.   MRN: [id number]  HPI 72 year old female comes in today complaining of urinary symptoms for a couple of days. She c/o of pelvic pressure, frequency and urgency.  No gross hematuria.  No fever, chills, or sweats.     Review of Systems     Objective:   Physical Exam  Constitutional: She is oriented to person, place, and time. She appears well-developed and well-nourished.  HENT:  Head: Normocephalic and atraumatic.  Eyes: Conjunctivae and EOM are normal.  Cardiovascular: Normal rate.  Pulmonary/Chest: Effort normal.  Abdominal: Soft. Bowel sounds are normal. She exhibits no distension and no mass. There is no tenderness. There is no rebound and no guarding.  Musculoskeletal:  No CVA tenderness  Neurological: She is alert and oriented to person, place, and time.  Skin: Skin is dry. No pallor.  Psychiatric: She has a normal mood and affect. Her behavior is normal.  Vitals reviewed.         Assessment & Plan:   Urinary tract infection-we will treat with nitrofurantoin.  Prescription sent to pharmacy.  Call if symptoms are not completely resolved in 5 days.  Make sure to increase water.  Make sure emptying bladder regularly and not holding the urine.Marland Kitchen

## 2018-02-05 ENCOUNTER — Other Ambulatory Visit: Payer: Self-pay | Admitting: Family Medicine

## 2018-03-10 ENCOUNTER — Telehealth: Payer: Self-pay

## 2018-03-10 NOTE — Telephone Encounter (Signed)
Really tough to know, if she hurting over any of the bony prominences, lateral malleolus, medial malleolus, base of the fifth metatarsal?  Was she able to bear weight after the injury?  These questions are ideally addressed and evaluated in a visit.

## 2018-03-10 NOTE — Telephone Encounter (Signed)
Michelle Lawrence called and left a message stating she twisted right foot and has pain on the left side. She would like to know if she should make an appointment or just ice it. There are only 3 acute appointment's left for tomorrow. Please advise.

## 2018-03-10 NOTE — Telephone Encounter (Signed)
Sure, the earlier the better, let to have her do 8 or 815.  I will get in a bit early so I can catch up.

## 2018-03-10 NOTE — Telephone Encounter (Signed)
Ok, so do you mind if I double book you tomorrow? If so what time.

## 2018-03-11 NOTE — Telephone Encounter (Signed)
Saw this message after time approved by Physician. Spoke with him, was advise to tell Pt to go ahead and come in now.  Spoke with pt, she advised she has been soaking her foot in Epson salt and it "feels better." Declined need for appt at this time.

## 2018-03-11 NOTE — Telephone Encounter (Signed)
Sigh.Michelle KitchenMarland KitchenMarland Lawrence

## 2018-04-19 ENCOUNTER — Ambulatory Visit (INDEPENDENT_AMBULATORY_CARE_PROVIDER_SITE_OTHER): Payer: Medicare HMO | Admitting: Family Medicine

## 2018-04-19 ENCOUNTER — Encounter: Payer: Self-pay | Admitting: Family Medicine

## 2018-04-19 VITALS — BP 131/68 | HR 84 | Ht 64.0 in | Wt 161.0 lb

## 2018-04-19 DIAGNOSIS — L02414 Cutaneous abscess of left upper limb: Secondary | ICD-10-CM

## 2018-04-19 DIAGNOSIS — R232 Flushing: Secondary | ICD-10-CM | POA: Diagnosis not present

## 2018-04-19 MED ORDER — DOXYCYCLINE HYCLATE 100 MG PO TABS: 100.0000 mg | ORAL_TABLET | Freq: Two times a day (BID) | ORAL | 0 refills | Status: DC

## 2018-04-19 NOTE — Progress Notes (Signed)
   Subjective:    Patient ID: Michelle Lawrence, female    DOB: [birth year], 72 y.o.   MRN: [id number]  HPI 72 year old female comes in today complaining of a lump/abscess on her left forearm that started over the weekend.  She is been using warm compresses on it.  It feels tight.  He works in [employer].  But she is also worried it could be a spider bite that she never saw a spider.  No fevers chills or sweats..  We will treat with doxycycline.  Call if not significantly better in 3 to 4 days.  That may take a week to heal.  Review of Systems     Objective:   Physical Exam  Constitutional: She is oriented to person, place, and time. She appears well-developed and well-nourished.  HENT:  Head: Normocephalic and atraumatic.  Eyes: Conjunctivae and EOM are normal.  Cardiovascular: Normal rate.  Pulmonary/Chest: Effort normal.  Neurological: She is alert and oriented to person, place, and time.  Skin: Skin is dry. No pallor.  Her left forearm she has a 2.0 x 2.0 cm erythematous nodule with a small scab in the center.  There is some slight erythema just distal to the lesion itself going towards the elbow.  Psychiatric: She has a normal mood and affect. Her behavior is normal.  Vitals reviewed.         Assessment & Plan:  Abscess., left arm - likely MRSA, with some early cellulitis-we will go ahead and treat with doxycycline.  If not better in a week please give [place] call back.  If she develops fever chills or sweats or feels like it worsens and please let me know.  She also complains of some hot flashes at nightthat have started recently.  She is actually coming in for routine exam in a couple weeks and so encouraged her to just remind me when we order her blood work at that point in time and we can check a thyroid level.

## 2018-04-28 ENCOUNTER — Ambulatory Visit: Payer: Medicare HMO | Admitting: Family Medicine

## 2018-04-29 ENCOUNTER — Encounter: Payer: Self-pay | Admitting: Family Medicine

## 2018-04-29 ENCOUNTER — Ambulatory Visit (INDEPENDENT_AMBULATORY_CARE_PROVIDER_SITE_OTHER): Payer: Medicare HMO | Admitting: Family Medicine

## 2018-04-29 VITALS — BP 136/63 | HR 72 | Ht 64.0 in | Wt 160.0 lb

## 2018-04-29 DIAGNOSIS — R232 Flushing: Secondary | ICD-10-CM | POA: Diagnosis not present

## 2018-04-29 DIAGNOSIS — Z23 Encounter for immunization: Secondary | ICD-10-CM

## 2018-04-29 DIAGNOSIS — E7800 Pure hypercholesterolemia, unspecified: Secondary | ICD-10-CM

## 2018-04-29 DIAGNOSIS — E538 Deficiency of other specified B group vitamins: Secondary | ICD-10-CM | POA: Diagnosis not present

## 2018-04-29 DIAGNOSIS — I10 Essential (primary) hypertension: Secondary | ICD-10-CM | POA: Diagnosis not present

## 2018-04-29 NOTE — Progress Notes (Signed)
Subjective:    CC: BP check  HPI:  Hypertension- Pt denies chest pain, SOB, dizziness, or heart palpitations.  Taking meds as directed w/o problems.  Denies medication side effects.    She would like some additional labs tested as well including her B12.  She has been taking her B12 supplement every other day.  She also complains of hot flashes.  They have been going on for several months and actually mentioned at the last time she was here.  They happen often at night but sometimes during the day as well.  If she gets outside at all she feels like she gets overheated and never used to feel that way.  She had a complete hysterectomy back in the 1980s and then she was on hormone therapy for less than 10 years.  Worsening or alleviating factors.  She has not tried anything over-the-counter for it.    Past medical history, Surgical history, Family history not pertinant except as noted below, Social history, Allergies, and medications have been entered into the medical record, reviewed, and corrections made.   Review of Systems: No fevers, chills, night sweats, weight loss, chest pain, or shortness of breath.   Objective:    General: Well Developed, well nourished, and in no acute distress.  Neuro: Alert and oriented x3, extra-ocular muscles intact, sensation grossly intact.  HEENT: Normocephalic, atraumatic  Skin: Warm and dry, no rashes. Cardiac: Regular rate and rhythm, no murmurs rubs or gallops, no lower extremity edema.  Respiratory: Clear to auscultation bilaterally. Not using accessory muscles, speaking in full sentences.   Impression and Recommendations:    HTN -well controlled.  Continue current regimen.  Due for CMP and lipid panel.  Hot flashes-we will check estradiol, progesterone and TSH level.  B12 deficiency-now taking B12 every other day.  Will recheck levels.

## 2018-05-02 DIAGNOSIS — E538 Deficiency of other specified B group vitamins: Secondary | ICD-10-CM | POA: Diagnosis not present

## 2018-05-02 DIAGNOSIS — E7800 Pure hypercholesterolemia, unspecified: Secondary | ICD-10-CM | POA: Diagnosis not present

## 2018-05-02 DIAGNOSIS — R232 Flushing: Secondary | ICD-10-CM | POA: Diagnosis not present

## 2018-05-02 DIAGNOSIS — I10 Essential (primary) hypertension: Secondary | ICD-10-CM | POA: Diagnosis not present

## 2018-05-03 LAB — LIPID PANEL
CHOLESTEROL: 149 mg/dL (ref <200)
HDL: 53 mg/dL (ref >50)
LDL Cholesterol (Calc): 77 mg/dL (calc)
Non-HDL Cholesterol (Calc): 96 mg/dL (calc) (ref <130)
TRIGLYCERIDES: 100 mg/dL (ref <150)
Total CHOL/HDL Ratio: 2.8 (calc) (ref <5.0)

## 2018-05-03 LAB — COMPLETE METABOLIC PANEL WITH GFR
AG Ratio: 1.8 (calc) (ref 1.0–2.5)
ALKALINE PHOSPHATASE (APISO): 63 U/L (ref 33–130)
ALT: 15 U/L (ref 6–29)
AST: 15 U/L (ref 10–35)
Albumin: 4.4 g/dL (ref 3.6–5.1)
BILIRUBIN TOTAL: 0.6 mg/dL (ref 0.2–1.2)
BUN / CREAT RATIO: 29 (calc) — AB (ref 6–22)
BUN: 13 mg/dL (ref 7–25)
CHLORIDE: 107 mmol/L (ref 98–110)
CO2: 32 mmol/L (ref 20–32)
Calcium: 9.9 mg/dL (ref 8.6–10.4)
Creat: 0.45 mg/dL — ABNORMAL LOW (ref 0.60–0.93)
GFR, Est African American: 116 mL/min/{1.73_m2} (ref >=60)
GFR, Est Non African American: 100 mL/min/{1.73_m2} (ref >=60)
GLUCOSE: 84 mg/dL (ref 65–99)
Globulin: 2.4 g/dL (calc) (ref 1.9–3.7)
Potassium: 4.7 mmol/L (ref 3.5–5.3)
Sodium: 143 mmol/L (ref 135–146)
Total Protein: 6.8 g/dL (ref 6.1–8.1)

## 2018-05-03 LAB — TSH: TSH: 1.19 mIU/L (ref 0.40–4.50)

## 2018-05-03 LAB — VITAMIN B12: Vitamin B-12: 723 pg/mL (ref 200–1100)

## 2018-05-03 LAB — PROGESTERONE

## 2018-05-03 LAB — ESTRADIOL: Estradiol: <15 pg/mL

## 2018-05-16 ENCOUNTER — Other Ambulatory Visit: Payer: Self-pay | Admitting: Family Medicine

## 2018-06-03 ENCOUNTER — Other Ambulatory Visit: Payer: Self-pay | Admitting: Family Medicine

## 2018-06-03 DIAGNOSIS — Z1231 Encounter for screening mammogram for malignant neoplasm of breast: Secondary | ICD-10-CM

## 2018-07-28 ENCOUNTER — Ambulatory Visit (INDEPENDENT_AMBULATORY_CARE_PROVIDER_SITE_OTHER): Payer: Medicare HMO

## 2018-07-28 DIAGNOSIS — Z1231 Encounter for screening mammogram for malignant neoplasm of breast: Secondary | ICD-10-CM | POA: Diagnosis not present

## 2018-08-03 ENCOUNTER — Telehealth: Payer: Self-pay | Admitting: Family Medicine

## 2018-08-03 NOTE — Telephone Encounter (Signed)
Left a brief VM for patient to return the Cologuard kit to [facility] or it would be cancelled. I have postponed the Colon Cancer Screening per Dr. Madilyn Fireman recommendations.

## 2018-08-05 NOTE — Telephone Encounter (Signed)
Okay, which she like to try to do stool cards instead?

## 2018-08-05 NOTE — Telephone Encounter (Signed)
Patient advised. She will do the stool cards again in March.

## 2018-08-05 NOTE — Telephone Encounter (Signed)
Michelle Lawrence called and states she never did the Cologuard because her insurance doesn't cover the Cologuard.

## 2018-09-12 ENCOUNTER — Other Ambulatory Visit: Payer: Self-pay

## 2018-09-12 DIAGNOSIS — E7800 Pure hypercholesterolemia, unspecified: Secondary | ICD-10-CM

## 2018-09-12 MED ORDER — ROSUVASTATIN CALCIUM 10 MG PO TABS: 10.0000 mg | ORAL_TABLET | Freq: Every day | ORAL | 2 refills | Status: DC

## 2018-11-03 ENCOUNTER — Other Ambulatory Visit: Payer: Self-pay

## 2018-11-03 ENCOUNTER — Ambulatory Visit (INDEPENDENT_AMBULATORY_CARE_PROVIDER_SITE_OTHER): Payer: Medicare HMO | Admitting: Family Medicine

## 2018-11-03 VITALS — BP 136/72 | HR 88 | Ht 64.0 in | Wt 161.0 lb

## 2018-11-03 DIAGNOSIS — I10 Essential (primary) hypertension: Secondary | ICD-10-CM | POA: Diagnosis not present

## 2018-11-03 DIAGNOSIS — N760 Acute vaginitis: Secondary | ICD-10-CM | POA: Diagnosis not present

## 2018-11-03 LAB — WET PREP FOR TRICH, YEAST, CLUE
MICRO NUMBER: 336967
SPECIMEN QUALITY 3963: ADEQUATE

## 2018-11-03 MED ORDER — BENAZEPRIL HCL 20 MG PO TABS: 20.0000 mg | ORAL_TABLET | Freq: Every day | ORAL | 1 refills | Status: DC

## 2018-11-03 NOTE — Progress Notes (Signed)
Subjective:    CC: BP  HPI:  Hypertension- Pt denies chest pain, SOB, dizziness, or heart palpitations.  Taking meds as directed w/o problems.  Denies medication side effects.    She has been having a little vaginal itching for a few days. She is not using any OTC treatments. No bleeding.  She says it seems to be coming and going.  No abnormal discharge.  Past medical history, Surgical history, Family history not pertinant except as noted below, Social history, Allergies, and medications have been entered into the medical record, reviewed, and corrections made.   Review of Systems: No fevers, chills, night sweats, weight loss, chest pain, or shortness of breath.   Objective:    General: Well Developed, well nourished, and in no acute distress.  Neuro: Alert and oriented x3, extra-ocular muscles intact, sensation grossly intact.  HEENT: Normocephalic, atraumatic  Skin: Warm and dry, no rashes. Cardiac: Regular rate and rhythm, no murmurs rubs or gallops, no lower extremity edema.  Respiratory: Clear to auscultation bilaterally. Not using accessory muscles, speaking in full sentences.   Impression and Recommendations:   HTN - Well controlled. Continue current regimen. Follow up in  6 months.  For BMP today.  Vaginitis -wet prep performed.  Will call with results once available.

## 2018-11-04 LAB — BASIC METABOLIC PANEL WITH GFR
BUN/Creatinine Ratio: 24 (calc) — ABNORMAL HIGH (ref 6–22)
BUN: 12 mg/dL (ref 7–25)
CALCIUM: 9.6 mg/dL (ref 8.6–10.4)
CHLORIDE: 106 mmol/L (ref 98–110)
CO2: 28 mmol/L (ref 20–32)
Creat: 0.5 mg/dL — ABNORMAL LOW (ref 0.60–0.93)
GFR, EST AFRICAN AMERICAN: 111 mL/min/{1.73_m2} (ref >=60)
GFR, Est Non African American: 96 mL/min/{1.73_m2} (ref >=60)
Glucose, Bld: 87 mg/dL (ref 65–99)
Potassium: 4.2 mmol/L (ref 3.5–5.3)
Sodium: 140 mmol/L (ref 135–146)

## 2018-11-04 NOTE — Progress Notes (Signed)
All labs are normal. 

## 2019-01-12 DIAGNOSIS — H2513 Age-related nuclear cataract, bilateral: Secondary | ICD-10-CM | POA: Diagnosis not present

## 2019-01-12 DIAGNOSIS — H5200 Hypermetropia, unspecified eye: Secondary | ICD-10-CM | POA: Diagnosis not present

## 2019-01-12 DIAGNOSIS — E7800 Pure hypercholesterolemia, unspecified: Secondary | ICD-10-CM | POA: Diagnosis not present

## 2019-01-12 DIAGNOSIS — Z135 Encounter for screening for eye and ear disorders: Secondary | ICD-10-CM | POA: Diagnosis not present

## 2019-01-12 DIAGNOSIS — H3589 Other specified retinal disorders: Secondary | ICD-10-CM | POA: Diagnosis not present

## 2019-01-12 DIAGNOSIS — I10 Essential (primary) hypertension: Secondary | ICD-10-CM | POA: Diagnosis not present

## 2019-02-06 ENCOUNTER — Ambulatory Visit: Payer: Medicare HMO | Admitting: Family Medicine

## 2019-02-06 NOTE — Progress Notes (Deleted)
Acute Office Visit  Subjective:    Patient ID: Michelle Lawrence, female    DOB: 1946-08-11, 73 y.o.   MRN: [id number]  No chief complaint on file.   HPI Patient is in today for a bump on her buttocks area. She has a history of recurrent MRSA boils.   Past Medical History:  Diagnosis Date  . Cancer ([place]) 1981   enfometrial or cervical?  . Hyperlipidemia   . Hypertension     Past Surgical History:  Procedure Laterality Date  . LAPAROSCOPIC CHOLECYSTECTOMY  2004  . TOTAL ABDOMINAL HYSTERECTOMY  1981   w/ BSO for cancerous reasons    Family History  Problem Relation Age of Onset  . Other Mother 48       CHF  . Lung cancer Father        63  . Cancer Sister        liver  . Lung cancer Brother        exposed to agent orange.   . Hyperlipidemia Sister   . Cancer Sister        stomach  . Hypertension Daughter 29  . Hyperlipidemia Maternal Uncle     Social History   Socioeconomic History  . Marital status: Married    Spouse name: Not on file  . Number of children: Not on file  . Years of education: Not on file  . Highest education level: Not on file  Occupational History  . Not on file  Social Needs  . Financial resource strain: Not on file  . Food insecurity    Worry: Not on file    Inability: Not on file  . Transportation needs    Medical: Not on file    Non-medical: Not on file  Tobacco Use  . Smoking status: Never Smoker  . Smokeless tobacco: Never Used  Substance and Sexual Activity  . Alcohol use: No  . Drug use: No  . Sexual activity: Not Currently  Lifestyle  . Physical activity    Days per week: Not on file    Minutes per session: Not on file  . Stress: Not on file  Relationships  . Social Herbalist on phone: Not on file    Gets together: Not on file    Attends religious service: Not on file    Active member of club or organization: Not on file    Attends meetings of clubs or organizations: Not on file    Relationship  status: Not on file  . Intimate partner violence    Fear of current or ex partner: Not on file    Emotionally abused: Not on file    Physically abused: Not on file    Forced sexual activity: Not on file  Other Topics Concern  . Not on file  Social History Narrative  . Not on file    Outpatient Medications Prior to Visit  Medication Sig Dispense Refill  . benazepril (LOTENSIN) 20 MG tablet Take 1 tablet (20 mg total) by mouth daily. 90 tablet 1  . Calcium Citrate-Vitamin D (CITRACAL + D PO) Take by mouth 2 (two) times daily.    . rosuvastatin (CRESTOR) 10 MG tablet Take 1 tablet (10 mg total) by mouth daily. 90 tablet 2  . vitamin B-12 (CYANOCOBALAMIN) 500 MCG tablet Take 500 mcg by mouth every other day.      No facility-administered medications prior to visit.     Allergies  Allergen Reactions  .  Bactrim [Sulfamethoxazole-Trimethoprim] Other (See Comments)    Thrush  . Codeine   . Latex   . Zoloft [Sertraline Hcl] Other (See Comments)    Chest pain    ROS     Objective:    Physical Exam  There were no vitals taken for this visit. Wt Readings from Last 3 Encounters:  11/03/18 161 lb (73 kg)  04/29/18 160 lb (72.6 kg)  04/19/18 161 lb (73 kg)    There are no preventive care reminders to display for this patient.  There are no preventive care reminders to display for this patient.   Lab Results  Component Value Date   TSH 1.19 05/02/2018   Lab Results  Component Value Date   WBC 4.1 03/12/2016   HGB 12.8 03/12/2016   HCT 38.6 03/12/2016   MCV 90.6 03/12/2016   PLT 251 03/12/2016   Lab Results  Component Value Date   NA 140 11/03/2018   K 4.2 11/03/2018   CO2 28 11/03/2018   GLUCOSE 87 11/03/2018   BUN 12 11/03/2018   CREATININE 0.50 (L) 11/03/2018   BILITOT 0.6 05/02/2018   ALKPHOS 55 03/12/2016   AST 15 05/02/2018   ALT 15 05/02/2018   PROT 6.8 05/02/2018   ALBUMIN 4.1 03/12/2016   CALCIUM 9.6 11/03/2018   Lab Results  Component Value  Date   CHOL 149 05/02/2018   Lab Results  Component Value Date   HDL 53 05/02/2018   Lab Results  Component Value Date   LDLCALC 77 05/02/2018   Lab Results  Component Value Date   TRIG 100 05/02/2018   Lab Results  Component Value Date   CHOLHDL 2.8 05/02/2018   Lab Results  Component Value Date   HGBA1C 5.3 03/12/2016       Assessment & Plan:   Problem List Items Addressed This Visit    None       No orders of the defined types were placed in this encounter.    Beatrice Lecher, MD

## 2019-04-27 ENCOUNTER — Other Ambulatory Visit: Payer: Self-pay | Admitting: Family Medicine

## 2019-04-27 DIAGNOSIS — I10 Essential (primary) hypertension: Secondary | ICD-10-CM

## 2019-05-04 ENCOUNTER — Ambulatory Visit: Payer: Medicare HMO | Admitting: Family Medicine

## 2019-05-11 ENCOUNTER — Encounter: Payer: Self-pay | Admitting: Family Medicine

## 2019-05-11 ENCOUNTER — Other Ambulatory Visit: Payer: Self-pay

## 2019-05-11 ENCOUNTER — Ambulatory Visit (INDEPENDENT_AMBULATORY_CARE_PROVIDER_SITE_OTHER): Payer: Medicare HMO | Admitting: Family Medicine

## 2019-05-11 VITALS — BP 132/60 | HR 78 | Temp 98.4°F | Ht 64.0 in | Wt 165.0 lb

## 2019-05-11 DIAGNOSIS — E7800 Pure hypercholesterolemia, unspecified: Secondary | ICD-10-CM | POA: Diagnosis not present

## 2019-05-11 DIAGNOSIS — I10 Essential (primary) hypertension: Secondary | ICD-10-CM

## 2019-05-11 DIAGNOSIS — H93233 Hyperacusis, bilateral: Secondary | ICD-10-CM

## 2019-05-11 DIAGNOSIS — M79604 Pain in right leg: Secondary | ICD-10-CM | POA: Diagnosis not present

## 2019-05-11 DIAGNOSIS — H6123 Impacted cerumen, bilateral: Secondary | ICD-10-CM

## 2019-05-11 DIAGNOSIS — M79605 Pain in left leg: Secondary | ICD-10-CM | POA: Diagnosis not present

## 2019-05-11 DIAGNOSIS — Z23 Encounter for immunization: Secondary | ICD-10-CM

## 2019-05-11 NOTE — Progress Notes (Signed)
Established Patient Office Visit  Subjective:  Patient ID: Michelle Lawrence, female    DOB: 1945-09-15  Age: 73 y.o. MRN: [id number]  CC:  Chief Complaint  Patient presents with  . Hypertension    HPI [name] presents for  Hypertension- Pt denies chest pain, SOB, dizziness, or heart palpitations.  Taking meds as directed w/o problems.  Denies medication side effects.  \  She also notices that she has not been hearing as well.  Mostly it is at work when they have meetings.  But the nurse giving the report out usually has a mask on and off and has her head tilted downward when she is speaking.  And then she has to ask other people what was said after the meeting.  She wonders if her ears could be blocked with wax again as she is had that happen before.  She also reports that her legs will ache at night on the days that she works.  She just works part-time.  She says she knows she probably needs to walk more in general she feels like she wears good supportive shoe wear she does not wear any type of compression stocking.  She does have a few varicose veins but says normally they do not really bother her.  It is mostly from her knees down.  Is not really a cramping sensation.  Hyperlipidemia - tolerating stating well with no myalgias or significant side effects.  Lab Results  Component Value Date   CHOL 149 05/02/2018   CHOL 155 04/26/2017   CHOL 234 (H) 03/12/2016   Lab Results  Component Value Date   HDL 53 05/02/2018   HDL 50 (L) 04/26/2017   HDL 48 03/12/2016   Lab Results  Component Value Date   LDLCALC 77 05/02/2018   LDLCALC 86 04/26/2017   LDLCALC 168 (H) 03/12/2016   Lab Results  Component Value Date   TRIG 100 05/02/2018   TRIG 101 04/26/2017   TRIG 91 03/12/2016   Lab Results  Component Value Date   CHOLHDL 2.8 05/02/2018   CHOLHDL 3.1 04/26/2017   CHOLHDL 4.9 03/12/2016   No results found for: LDLDIRECT     Past Medical History:  Diagnosis Date   . Cancer ([place]) 1981   enfometrial or cervical?  . Hyperlipidemia   . Hypertension     Past Surgical History:  Procedure Laterality Date  . LAPAROSCOPIC CHOLECYSTECTOMY  2004  . TOTAL ABDOMINAL HYSTERECTOMY  1981   w/ BSO for cancerous reasons    Family History  Problem Relation Age of Onset  . Other Mother 33       CHF  . Lung cancer Father        53  . Cancer Sister        liver  . Lung cancer Brother        exposed to agent orange.   . Hyperlipidemia Sister   . Cancer Sister        stomach  . Hypertension Daughter 66  . Hyperlipidemia Maternal Uncle     Social History   Socioeconomic History  . Marital status: Married    Spouse name: Not on file  . Number of children: Not on file  . Years of education: Not on file  . Highest education level: Not on file  Occupational History  . Not on file  Social Needs  . Financial resource strain: Not on file  . Food insecurity    Worry: Not  on file    Inability: Not on file  . Transportation needs    Medical: Not on file    Non-medical: Not on file  Tobacco Use  . Smoking status: Never Smoker  . Smokeless tobacco: Never Used  Substance and Sexual Activity  . Alcohol use: No  . Drug use: No  . Sexual activity: Not Currently  Lifestyle  . Physical activity    Days per week: Not on file    Minutes per session: Not on file  . Stress: Not on file  Relationships  . Social Herbalist on phone: Not on file    Gets together: Not on file    Attends religious service: Not on file    Active member of club or organization: Not on file    Attends meetings of clubs or organizations: Not on file    Relationship status: Not on file  . Intimate partner violence    Fear of current or ex partner: Not on file    Emotionally abused: Not on file    Physically abused: Not on file    Forced sexual activity: Not on file  Other Topics Concern  . Not on file  Social History Narrative  . Not on file    Outpatient  Medications Prior to Visit  Medication Sig Dispense Refill  . benazepril (LOTENSIN) 20 MG tablet TAKE 1 TABLET BY MOUTH EVERY DAY 90 tablet 1  . Calcium Citrate-Vitamin D (CITRACAL + D PO) Take by mouth 2 (two) times daily.    . rosuvastatin (CRESTOR) 10 MG tablet Take 1 tablet (10 mg total) by mouth daily. 90 tablet 2  . vitamin B-12 (CYANOCOBALAMIN) 500 MCG tablet Take 500 mcg by mouth every other day.      No facility-administered medications prior to visit.     Allergies  Allergen Reactions  . Bactrim [Sulfamethoxazole-Trimethoprim] Other (See Comments)    Thrush  . Codeine   . Latex   . Zoloft [Sertraline Hcl] Other (See Comments)    Chest pain    ROS Review of Systems    Objective:    Physical Exam  Constitutional: She is oriented to person, place, and time. She appears well-developed and well-nourished.  HENT:  Head: Normocephalic and atraumatic.  Cardiovascular: Normal rate, regular rhythm and normal heart sounds.  Pulmonary/Chest: Effort normal and breath sounds normal.  Musculoskeletal:     Comments: Lower legs appear normal with no significant erythema swelling or rash she does have a few varicose veins but none of them are swollen or tender.  Neurological: She is alert and oriented to person, place, and time.  Skin: Skin is warm and dry.  Psychiatric: She has a normal mood and affect. Her behavior is normal.    BP 132/60   Pulse 78   Temp 98.4 F (36.9 C)   Ht 5\' 4"  (1.626 m)   Wt 165 lb (74.8 kg)   SpO2 96%   BMI 28.32 kg/m  Wt Readings from Last 3 Encounters:  05/11/19 165 lb (74.8 kg)  11/03/18 161 lb (73 kg)  04/29/18 160 lb (72.6 kg)     Health Maintenance Due  Topic Date Due  . COLON CANCER SCREENING ANNUAL FOBT  10/30/2018    There are no preventive care reminders to display for this patient.  Lab Results  Component Value Date   TSH 1.19 05/02/2018   Lab Results  Component Value Date   WBC 4.1 03/12/2016   HGB 12.8  03/12/2016    HCT 38.6 03/12/2016   MCV 90.6 03/12/2016   PLT 251 03/12/2016   Lab Results  Component Value Date   NA 140 11/03/2018   K 4.2 11/03/2018   CO2 28 11/03/2018   GLUCOSE 87 11/03/2018   BUN 12 11/03/2018   CREATININE 0.50 (L) 11/03/2018   BILITOT 0.6 05/02/2018   ALKPHOS 55 03/12/2016   AST 15 05/02/2018   ALT 15 05/02/2018   PROT 6.8 05/02/2018   ALBUMIN 4.1 03/12/2016   CALCIUM 9.6 11/03/2018   Lab Results  Component Value Date   CHOL 149 05/02/2018   Lab Results  Component Value Date   HDL 53 05/02/2018   Lab Results  Component Value Date   LDLCALC 77 05/02/2018   Lab Results  Component Value Date   TRIG 100 05/02/2018   Lab Results  Component Value Date   CHOLHDL 2.8 05/02/2018   Lab Results  Component Value Date   HGBA1C 5.3 03/12/2016      Assessment & Plan:   Problem List Items Addressed This Visit      Cardiovascular and Mediastinum   ESSENTIAL HYPERTENSION, BENIGN - Primary    Well controlled. Continue current regimen. Follow up in 6 mo      Relevant Orders   COMPLETE METABOLIC PANEL WITH GFR   Lipid Panel w/reflex Direct LDL     Other   Hyperlipemia    Plan to recheck lipid levels.  She will go when she is fasting.      Relevant Orders   COMPLETE METABOLIC PANEL WITH GFR   Lipid Panel w/reflex Direct LDL    Other Visit Diagnoses    Need for immunization against influenza       Relevant Orders   Flu Vaccine QUAD High Dose(Fluad) (Completed)   Pain in both lower extremities       Hearing abnormally acute, bilateral       Bilateral impacted cerumen          Hearing abnormality-she did have a fair amount of cerumen in both canals.  Irrigation performed.  Patient tolerated well.  If hearing not improving over the next week or 2 then please let us know and can work-up further and refer her to audiometry.  Leg aching-we discussed wearing compression stockings.  Recommending going to 1 of the local DME stores and wear those on the days  that she is working she does not really get a lot of swelling and she does have a few varicose veins on exam but none of them appear to be inflamed or bulging.  She can call back if this is not helping.  Indication: Cerumen impaction of the ear(s) Medical necessity statement: On physical examination, cerumen impairs clinically significant portions of the external auditory canal, and tympanic membrane. Noted obstructive, copious cerumen that cannot be removed without magnification and instrumentations requiring physician skills Consent: Discussed benefits and risks of procedure and verbal consent obtained Procedure: Patient was prepped for the procedure. Utilized an otoscope to assess and take note of the ear canal, the tympanic membrane, and the presence, amount, and placement of the cerumen. Gentle water irrigation and soft plastic curette was utilized to remove cerumen.  Post procedure examination: shows cerumen was completely removed. Patient tolerated procedure well. The patient is made aware that they may experience temporary vertigo, temporary hearing loss, and temporary discomfort. If these symptom last for more than 24 hours to call the clinic or proceed to the ED.  No orders of the defined types were placed in this encounter.   Follow-up: Return in about 6 months (around 11/08/2019) for Hypertension.    Beatrice Lecher, MD

## 2019-05-11 NOTE — Assessment & Plan Note (Signed)
Plan to recheck lipid levels.  She will go when she is fasting.

## 2019-05-11 NOTE — Assessment & Plan Note (Signed)
Well controlled. Continue current regimen. Follow up in  6 mo  

## 2019-05-22 DIAGNOSIS — I10 Essential (primary) hypertension: Secondary | ICD-10-CM | POA: Diagnosis not present

## 2019-05-22 DIAGNOSIS — E7800 Pure hypercholesterolemia, unspecified: Secondary | ICD-10-CM | POA: Diagnosis not present

## 2019-05-23 LAB — COMPLETE METABOLIC PANEL WITH GFR
AG Ratio: 1.6 (calc) (ref 1.0–2.5)
ALT: 14 U/L (ref 6–29)
AST: 14 U/L (ref 10–35)
Albumin: 4.2 g/dL (ref 3.6–5.1)
Alkaline phosphatase (APISO): 56 U/L (ref 37–153)
BUN: 16 mg/dL (ref 7–25)
CO2: 27 mmol/L (ref 20–32)
Calcium: 9.5 mg/dL (ref 8.6–10.4)
Chloride: 108 mmol/L (ref 98–110)
Creat: 0.61 mg/dL (ref 0.60–0.93)
GFR, Est African American: 104 mL/min/{1.73_m2} (ref >=60)
GFR, Est Non African American: 90 mL/min/{1.73_m2} (ref >=60)
Globulin: 2.7 g/dL (calc) (ref 1.9–3.7)
Glucose, Bld: 88 mg/dL (ref 65–99)
Potassium: 4.4 mmol/L (ref 3.5–5.3)
Sodium: 141 mmol/L (ref 135–146)
Total Bilirubin: 0.5 mg/dL (ref 0.2–1.2)
Total Protein: 6.9 g/dL (ref 6.1–8.1)

## 2019-05-23 LAB — LIPID PANEL W/REFLEX DIRECT LDL
Cholesterol: 159 mg/dL (ref <200)
HDL: 47 mg/dL — ABNORMAL LOW (ref >=50)
LDL Cholesterol (Calc): 91 mg/dL (calc)
Non-HDL Cholesterol (Calc): 112 mg/dL (calc) (ref <130)
Total CHOL/HDL Ratio: 3.4 (calc) (ref <5.0)
Triglycerides: 114 mg/dL (ref <150)

## 2019-05-23 NOTE — Progress Notes (Signed)
All labs are normal. 

## 2019-06-04 ENCOUNTER — Other Ambulatory Visit: Payer: Self-pay | Admitting: Family Medicine

## 2019-06-04 DIAGNOSIS — E7800 Pure hypercholesterolemia, unspecified: Secondary | ICD-10-CM

## 2019-09-04 ENCOUNTER — Other Ambulatory Visit: Payer: Self-pay | Admitting: Family Medicine

## 2019-09-04 DIAGNOSIS — I10 Essential (primary) hypertension: Secondary | ICD-10-CM

## 2019-09-05 NOTE — Progress Notes (Deleted)
Subjective:   Michelle Lawrence is a 74 y.o. female who presents for Medicare Annual (Subsequent) preventive examination.  Review of Systems:  No ROS.  Medicare Wellness Virtual Visit.  Visual/audio telehealth visit, UTA vital signs.   See social history for additional risk factors.      Sleep patterns:    Home Safety/Smoke Alarms: Feels safe in home. Smoke alarms in place.  Living environment;  Seat Belt Safety/Bike Helmet: Wears seat belt.   Female:   Pap- Aged out      Mammo- UTD      Dexa scan-        CCS-     Objective:     Vitals: There were no vitals taken for this visit.  There is no height or weight on file to calculate BMI.  Advanced Directives 09/23/2017 10/08/2016 10/08/2016 01/05/2014  Does Patient Have a Medical Advance Directive? No - No Patient has advance directive, copy not in chart  Type of Advance Directive - - - [place] in Chart? - - - Copy requested from family  Would patient like information on creating a medical advance directive? No - Patient declined Yes (MAU/Ambulatory/Procedural Areas - Information given) Yes (MAU/Ambulatory/Procedural Areas - Information given) -    Tobacco Social History   Tobacco Use  Smoking Status Never Smoker  Smokeless Tobacco Never Used     Counseling given: Not Answered   Clinical Intake:                       Past Medical History:  Diagnosis Date  . Cancer ([place]) 1981   enfometrial or cervical?  . Hyperlipidemia   . Hypertension    Past Surgical History:  Procedure Laterality Date  . LAPAROSCOPIC CHOLECYSTECTOMY  2004  . TOTAL ABDOMINAL HYSTERECTOMY  1981   w/ BSO for cancerous reasons   Family History  Problem Relation Age of Onset  . Other Mother 65       CHF  . Lung cancer Father        84  . Cancer Sister        liver  . Lung cancer Brother        exposed to agent orange.   . Hyperlipidemia Sister   . Cancer Sister      stomach  . Hypertension Daughter 4  . Hyperlipidemia Maternal Uncle    Social History   Socioeconomic History  . Marital status: Married    Spouse name: Not on file  . Number of children: Not on file  . Years of education: Not on file  . Highest education level: Not on file  Occupational History  . Not on file  Tobacco Use  . Smoking status: Never Smoker  . Smokeless tobacco: Never Used  Substance and Sexual Activity  . Alcohol use: No  . Drug use: No  . Sexual activity: Not Currently  Other Topics Concern  . Not on file  Social History Narrative  . Not on file   Social Determinants of Health   Financial Resource Strain:   . Difficulty of Paying Living Expenses: Not on file  Food Insecurity:   . Worried About Charity fundraiser in the Last Year: Not on file  . Ran Out of Food in the Last Year: Not on file  Transportation Needs:   . Lack of Transportation (Medical): Not on file  . Lack of Transportation (Non-Medical): Not  on file  Physical Activity:   . Days of Exercise per Week: Not on file  . Minutes of Exercise per Session: Not on file  Stress:   . Feeling of Stress : Not on file  Social Connections:   . Frequency of Communication with Friends and Family: Not on file  . Frequency of Social Gatherings with Friends and Family: Not on file  . Attends Religious Services: Not on file  . Active Member of Clubs or Organizations: Not on file  . Attends Archivist Meetings: Not on file  . Marital Status: Not on file    Outpatient Encounter Medications as of 09/11/2019  Medication Sig  . benazepril (LOTENSIN) 20 MG tablet TAKE 1 TABLET BY MOUTH EVERY DAY  . Calcium Citrate-Vitamin D (CITRACAL + D PO) Take by mouth 2 (two) times daily.  . rosuvastatin (CRESTOR) 10 MG tablet TAKE 1 TABLET BY MOUTH EVERY DAY  . vitamin B-12 (CYANOCOBALAMIN) 500 MCG tablet Take 500 mcg by mouth every other day.    No facility-administered encounter medications on file as of  09/11/2019.    Activities of Daily Living No flowsheet data found.  Patient Care Team: Hali Marry, MD as PCP - General (Family Medicine)    Assessment:   This is a routine wellness examination for [place].Physical assessment deferred to PCP.   Exercise Activities and Dietary recommendations   Diet  Breakfast: Lunch:  Dinner:       Goals    . Exercise 3x per week (30 min per time) (pt-stated)     She would like to start walking at least 3 times a week for about 30 minutes at a time.       Fall Risk Fall Risk  11/29/2017 11/29/2017 11/29/2017 10/08/2016 03/09/2016  Falls in the past year? - No Yes No No  Number falls in past yr: (No Data) - 1 - -  Comment no falls result entered in error - - - -  Injury with Fall? - - No - -  Risk for fall due to : - - Impaired vision - -  Follow up (No Data) - Falls prevention discussed - -  Comment pt has not had any falls - pt reports it was dark - -   Is the patient's home free of loose throw rugs in walkways, pet beds, electrical cords, etc?   {Blank single:19197::"yes","no"}      Grab bars in the bathroom? {Blank single:19197::"yes","no"}      Handrails on the stairs?   {Blank single:19197::"yes","no"}      Adequate lighting?   {Blank single:19197::"yes","no"}  Depression Screen PHQ 2/9 Scores 11/03/2018 11/29/2017 11/29/2017 09/23/2017  PHQ - 2 Score 0 0 0 0  PHQ- 9 Score - - - 0     Cognitive Function     6CIT Screen 10/08/2016  What Year? 0 points  What month? 0 points  What time? 0 points  Count back from 20 0 points  Months in reverse 0 points  Repeat phrase 0 points  Total Score 0    Immunization History  Administered Date(s) Administered  . Fluad Quad(high Dose 65+) 05/11/2019  . Influenza Split 05/08/2011, 05/09/2012  . Influenza, High Dose Seasonal PF 05/18/2016  . Influenza,inj,Quad PF,6+ Mos 04/10/2013, 05/03/2014, 05/02/2015, 04/15/2017, 04/29/2018  . Pneumococcal Conjugate-13 05/02/2015  .  Pneumococcal Polysaccharide-23 05/09/2012  . Tdap 02/04/2012    Screening Tests Health Maintenance  Topic Date Due  . COLON CANCER SCREENING ANNUAL FOBT  10/30/2018  .  COLONOSCOPY  01/15/2020 (Originally 10/28/1995)  . MAMMOGRAM  07/28/2020  . TETANUS/TDAP  02/03/2022  . INFLUENZA VACCINE  Completed  . DEXA SCAN  Completed  . Hepatitis C Screening  Completed  . PNA vac Low Risk Adult  Completed       Plan:   ***   I have personally reviewed and noted the following in the patient's chart:   . Medical and social history . Use of alcohol, tobacco or illicit drugs  . Current medications and supplements . Functional ability and status . Nutritional status . Physical activity . Advanced directives . List of other physicians . Hospitalizations, surgeries, and ER visits in previous 12 months . Vitals . Screenings to include cognitive, depression, and falls . Referrals and appointments  In addition, I have reviewed and discussed with patient certain preventive protocols, quality metrics, and best practice recommendations. A written personalized care plan for preventive services as well as general preventive health recommendations were provided to patient.     Joanne Chars, LPN  [id number]

## 2019-09-18 NOTE — Progress Notes (Signed)
Subjective:   Michelle Lawrence is a 74 y.o. female who presents for Medicare Annual (Subsequent) preventive examination.  Review of Systems:  No ROS.  Medicare Wellness Virtual Visit.  Visual/audio telehealth visit, UTA vital signs.   See social history for additional risk factors.    Cardiac Risk Factors include: none;hypertension;advanced age (>70men, >72 women) Sleep patterns:Getting 7 hours of sleep a night. Having some restless leg symptoms.Wakes up 2 times to void. Wakes up and feels rested and ready for the day.    Home Safety/Smoke Alarms: Feels safe in home. Smoke alarms in place.  Living environment; Lives with husband in a 1 story home and no steps in or around the home. Shower is a walk in shower with anti slip mat in place. Seat Belt Safety/Bike Helmet: Wears seat belt.   Female:   Pap- Aged out      Mammo-  UTD     Dexa scan- UTD       CCS- declined     Objective:     Vitals: BP 138/61   Pulse 80   Ht 5\' 4"  (1.626 m)   Wt 160 lb (72.6 kg)   SpO2 98%   BMI 27.46 kg/m   Body mass index is 27.46 kg/m.  Advanced Directives 09/25/2019 09/23/2017 10/08/2016 10/08/2016 01/05/2014  Does Patient Have a Medical Advance Directive? No No - No Patient has advance directive, copy not in chart  Type of Advance Directive - - - - [employer] [place] requested from family  Would patient like information on creating a medical advance directive? No - Patient declined No - Patient declined Yes (MAU/Ambulatory/Procedural Areas - Information given) Yes (MAU/Ambulatory/Procedural Areas - Information given) -    Tobacco Social History   Tobacco Use  Smoking Status Never Smoker  Smokeless Tobacco Never Used     Counseling given: No   Clinical Intake:  Pre-visit preparation completed: Yes  Pain : No/denies pain     Nutritional Risks: None Diabetes: No  How often do you need to have someone help you  when you read instructions, pamphlets, or other written materials from your doctor or pharmacy?: 1 - Never What is the last grade level you completed in school?: 12  Interpreter Needed?: No  Information entered by :: Orlie Dakin, LPN  Past Medical History:  Diagnosis Date  . Cancer ([place]) 1981   enfometrial or cervical?  . Hyperlipidemia   . Hypertension    Past Surgical History:  Procedure Laterality Date  . LAPAROSCOPIC CHOLECYSTECTOMY  2004  . TOTAL ABDOMINAL HYSTERECTOMY  1981   w/ BSO for cancerous reasons   Family History  Problem Relation Age of Onset  . Other Mother 19       CHF  . Lung cancer Father        53  . Cancer Sister        liver  . Lung cancer Brother        exposed to agent orange.   . Hyperlipidemia Sister   . Cancer Sister        stomach  . Hypertension Daughter 24  . Hyperlipidemia Maternal Uncle    Social History   Socioeconomic History  . Marital status: Married    Spouse name: Gwyndolyn Saxon  . Number of children: 2  . Years of education: 98  . Highest education level: High school graduate  Occupational History  .  Occupation: Environmental consultant living    Comment: housekeeping  Tobacco Use  . Smoking status: Never Smoker  . Smokeless tobacco: Never Used  Substance and Sexual Activity  . Alcohol use: No  . Drug use: No  . Sexual activity: Yes  Other Topics Concern  . Not on file  Social History Narrative   Works at assisted living part time   Social Determinants of Radio broadcast assistant Strain:   . Difficulty of Paying Living Expenses: Not on file  Food Insecurity:   . Worried About Charity fundraiser in the Last Year: Not on file  . Ran Out of Food in the Last Year: Not on file  Transportation Needs:   . Lack of Transportation (Medical): Not on file  . Lack of Transportation (Non-Medical): Not on file  Physical Activity:   . Days of Exercise per Week: Not on file  . Minutes of Exercise per Session: Not on file  Stress:   . Feeling  of Stress : Not on file  Social Connections:   . Frequency of Communication with Friends and Family: Not on file  . Frequency of Social Gatherings with Friends and Family: Not on file  . Attends Religious Services: Not on file  . Active Member of Clubs or Organizations: Not on file  . Attends Archivist Meetings: Not on file  . Marital Status: Not on file    Outpatient Encounter Medications as of 09/25/2019  Medication Sig  . benazepril (LOTENSIN) 20 MG tablet TAKE 1 TABLET BY MOUTH EVERY DAY  . Calcium Citrate-Vitamin D (CITRACAL + D PO) Take by mouth 2 (two) times daily.  . rosuvastatin (CRESTOR) 10 MG tablet TAKE 1 TABLET BY MOUTH EVERY DAY  . vitamin B-12 (CYANOCOBALAMIN) 500 MCG tablet Take 500 mcg by mouth every other day.    No facility-administered encounter medications on file as of 09/25/2019.    Activities of Daily Living In your present state of health, do you have any difficulty performing the following activities: 09/25/2019  Hearing? N  Vision? N  Difficulty concentrating or making decisions? N  Walking or climbing stairs? Y  Comment going up stairs sometimes gets SOB  Dressing or bathing? N  Doing errands, shopping? N  Preparing Food and eating ? N  Using the Toilet? N  In the past six months, have you accidently leaked urine? N  Do you have problems with loss of bowel control? N  Managing your Medications? N  Managing your Finances? N  Housekeeping or managing your Housekeeping? N  Some recent data might be hidden    Patient Care Team: Hali Marry, MD as PCP - General (Family Medicine)    Assessment:   This is a routine wellness examination for [place].Physical assessment deferred to PCP.   Exercise Activities and Dietary recommendations Current Exercise Habits: Home exercise routine, Type of exercise: walking, Time (Minutes): 45, Frequency (Times/Week): 3, Weekly Exercise (Minutes/Week): 135, Intensity: Mild, Exercise limited by: None  identified Diet Eats a healthy diet of vegetables, fruits and proteins. Breakfast: oatmeal and toast Lunch: vegetables or sandwich Dinner:  Meat and vegetables Drinks 3-4 bottles of water daily     Goals    . Exercise 3x per week (30 min per time) (pt-stated)     She would like to start walking at least 3 times a week for about 30 minutes at a time.    . Exercise 3x per week (30 min per time)  Would like to increase her walking more this year       Fall Risk Fall Risk  09/25/2019 11/29/2017 11/29/2017 11/29/2017 10/08/2016  Falls in the past year? 0 - No Yes No  Number falls in past yr: - (No Data) - 1 -  Comment - no falls result entered in error - - -  Injury with Fall? - - - No -  Risk for fall due to : No Fall Risks - - Impaired vision -  Follow up Falls prevention discussed (No Data) - Falls prevention discussed -  Comment - pt has not had any falls - pt reports it was dark -   Is the patient's home free of loose throw rugs in walkways, pet beds, electrical cords, etc?   yes      Grab bars in the bathroom? no      Handrails on the stairs?   no      Adequate lighting?   yes   Depression Screen PHQ 2/9 Scores 09/25/2019 11/03/2018 11/29/2017 11/29/2017  PHQ - 2 Score 1 0 0 0  PHQ- 9 Score - - - -     Cognitive Function     6CIT Screen 09/25/2019 10/08/2016  What Year? 0 points 0 points  What month? 0 points 0 points  What time? 0 points 0 points  Count back from 20 0 points 0 points  Months in reverse 0 points 0 points  Repeat phrase 2 points 0 points  Total Score 2 0    Immunization History  Administered Date(s) Administered  . Fluad Quad(high Dose 65+) 05/11/2019  . Influenza Split 05/08/2011, 05/09/2012  . Influenza, High Dose Seasonal PF 05/18/2016  . Influenza,inj,Quad PF,6+ Mos 04/10/2013, 05/03/2014, 05/02/2015, 04/15/2017, 04/29/2018  . Pneumococcal Conjugate-13 05/02/2015  . Pneumococcal Polysaccharide-23 05/09/2012  . Tdap 02/04/2012    Screening  Tests Health Maintenance  Topic Date Due  . COLON CANCER SCREENING ANNUAL FOBT  10/30/2018  . COLONOSCOPY  01/15/2020 (Originally 10/28/1995)  . MAMMOGRAM  07/28/2020  . TETANUS/TDAP  02/03/2022  . INFLUENZA VACCINE  Completed  . DEXA SCAN  Completed  . Hepatitis C Screening  Completed  . PNA vac Low Risk Adult  Completed        Plan:    Please schedule your next medicare wellness visit with me in 1 yr.  Ms. Nease , Thank you for taking time to come for your Medicare Wellness Visit. I appreciate your ongoing commitment to your health goals. Please review the following plan we discussed and let me know if I can assist you in the future.  Continue doing brain stimulating activities (puzzles, reading, adult coloring books, staying active) to keep memory sharp.  Advised patient she could take Melatonin to help her with sleep and this is over the counter.  These are the goals we discussed: Goals    . Exercise 3x per week (30 min per time) (pt-stated)     She would like to start walking at least 3 times a week for about 30 minutes at a time.    . Exercise 3x per week (30 min per time)     Would like to increase her walking more this year       This is a list of the screening recommended for you and due dates:  Health Maintenance  Topic Date Due  . Stool Blood Test  10/30/2018  . Colon Cancer Screening  01/15/2020*  . Mammogram  07/28/2020  . Tetanus Vaccine  02/03/2022  . Flu Shot  Completed  . DEXA scan (bone density measurement)  Completed  .  Hepatitis C: One time screening is recommended by Center for Disease Control  (CDC) for  adults born from [birth year] through 1965.   Completed  . Pneumonia vaccines  Completed  *Topic was postponed. The date shown is not the original due date.      I have personally reviewed and noted the following in the patient's chart:   . Medical and social history . Use of alcohol, tobacco or illicit drugs  . Current medications and  supplements . Functional ability and status . Nutritional status . Physical activity . Advanced directives . List of other physicians . Hospitalizations, surgeries, and ER visits in previous 12 months . Vitals . Screenings to include cognitive, depression, and falls . Referrals and appointments  In addition, I have reviewed and discussed with patient certain preventive protocols, quality metrics, and best practice recommendations. A written personalized care plan for preventive services as well as general preventive health recommendations were provided to patient.     Joanne Chars, LPN  [id number]

## 2019-09-25 ENCOUNTER — Ambulatory Visit (INDEPENDENT_AMBULATORY_CARE_PROVIDER_SITE_OTHER): Payer: Medicare HMO | Admitting: *Deleted

## 2019-09-25 VITALS — BP 138/61 | HR 80 | Ht 64.0 in | Wt 160.0 lb

## 2019-09-25 DIAGNOSIS — Z0000 Encounter for general adult medical examination without abnormal findings: Secondary | ICD-10-CM

## 2019-09-25 NOTE — Patient Instructions (Addendum)
Please schedule your next medicare wellness visit with me in 1 yr.  Michelle Lawrence , Thank you for taking time to come for your Medicare Wellness Visit. I appreciate your ongoing commitment to your health goals. Please review the following plan we discussed and let me know if I can assist you in the future.  Continue doing brain stimulating activities (puzzles, reading, adult coloring books, staying active) to keep memory sharp.  Advised patient she could take Melatonin to help her with sleep and this is over the counter.  These are the goals we discussed: Goals    . Exercise 3x per week (30 min per time) (pt-stated)     She would like to start walking at least 3 times a week for about 30 minutes at a time.    . Exercise 3x per week (30 min per time)     Would like to increase her walking more this year

## 2019-09-27 DIAGNOSIS — Z03818 Encounter for observation for suspected exposure to other biological agents ruled out: Secondary | ICD-10-CM | POA: Diagnosis not present

## 2019-10-04 DIAGNOSIS — Z03818 Encounter for observation for suspected exposure to other biological agents ruled out: Secondary | ICD-10-CM | POA: Diagnosis not present

## 2019-10-11 DIAGNOSIS — Z03818 Encounter for observation for suspected exposure to other biological agents ruled out: Secondary | ICD-10-CM | POA: Diagnosis not present

## 2019-10-25 DIAGNOSIS — Z03818 Encounter for observation for suspected exposure to other biological agents ruled out: Secondary | ICD-10-CM | POA: Diagnosis not present

## 2019-11-01 DIAGNOSIS — Z03818 Encounter for observation for suspected exposure to other biological agents ruled out: Secondary | ICD-10-CM | POA: Diagnosis not present

## 2019-11-08 DIAGNOSIS — Z03818 Encounter for observation for suspected exposure to other biological agents ruled out: Secondary | ICD-10-CM | POA: Diagnosis not present

## 2019-11-09 ENCOUNTER — Other Ambulatory Visit: Payer: Self-pay

## 2019-11-09 ENCOUNTER — Encounter: Payer: Self-pay | Admitting: Family Medicine

## 2019-11-09 ENCOUNTER — Ambulatory Visit (INDEPENDENT_AMBULATORY_CARE_PROVIDER_SITE_OTHER): Payer: Medicare HMO | Admitting: Family Medicine

## 2019-11-09 VITALS — BP 138/84 | HR 84 | Ht 64.0 in | Wt 158.0 lb

## 2019-11-09 DIAGNOSIS — R351 Nocturia: Secondary | ICD-10-CM | POA: Insufficient documentation

## 2019-11-09 DIAGNOSIS — I10 Essential (primary) hypertension: Secondary | ICD-10-CM | POA: Diagnosis not present

## 2019-11-09 DIAGNOSIS — L301 Dyshidrosis [pompholyx]: Secondary | ICD-10-CM

## 2019-11-09 MED ORDER — BETAMETHASONE VALERATE 0.1 % EX CREA: TOPICAL_CREAM | Freq: Every evening | CUTANEOUS | 0 refills | Status: DC | PRN

## 2019-11-09 NOTE — Progress Notes (Signed)
Established Patient Office Visit  Subjective:  Patient ID: Michelle Lawrence, female    DOB: Feb 04, 1946  Age: 74 y.o. MRN: [id number]  CC:  Chief Complaint  Patient presents with  . Hypertension    HPI Michelle Lawrence presents for 6 month f/u  Hypertension- Pt denies chest pain, SOB, dizziness, or heart palpitations.  Taking meds as directed w/o problems.  Denies medication side effects.  He says she has not been exercising regularly like she used to.  But plans to get back into it.  He did want a let me know that she was urinating about twice at night she was not sure if this was normal or not.  She not having any pain with urination.  She does drink a fair amount of fluid in the evenings.  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  She also still gets some rash and scaliness and itching on her left palm.  Had given her cream several years ago but she says that it is old and thinks it has expired.  She usually will use Vaseline lotion and that does seem to help.  Past Medical History:  Diagnosis Date  . Cancer ([place]) 1981   enfometrial or cervical?  . Hyperlipidemia   . Hypertension     Past Surgical History:  Procedure Laterality Date  . LAPAROSCOPIC CHOLECYSTECTOMY  2004  . TOTAL ABDOMINAL HYSTERECTOMY  1981   w/ BSO for cancerous reasons    Family History  Problem Relation Age of Onset  . Other Mother 33       CHF  . Lung cancer Father        55  . Cancer Sister        liver  . Lung cancer Brother        exposed to agent orange.   . Hyperlipidemia Sister   . Cancer Sister        stomach  . Hypertension Daughter 42  . Hyperlipidemia Maternal Uncle     Social History   Socioeconomic History  . Marital status: Married    Spouse name: Gwyndolyn Saxon  . Number of children: 2  . Years of education: 87  . Highest education level: High school graduate  Occupational History  . Occupation: Environmental consultant living    Comment: housekeeping  Tobacco Use  . Smoking  status: Never Smoker  . Smokeless tobacco: Never Used  Substance and Sexual Activity  . Alcohol use: No  . Drug use: No  . Sexual activity: Yes  Other Topics Concern  . Not on file  Social History Narrative   Works at assisted living part time   Social Determinants of Radio broadcast assistant Strain:   . Difficulty of Paying Living Expenses:   Food Insecurity:   . Worried About Charity fundraiser in the Last Year:   . Arboriculturist in the Last Year:   Transportation Needs:   . Film/video editor (Medical):   Marland Kitchen Lack of Transportation (Non-Medical):   Physical Activity:   . Days of Exercise per Week:   . Minutes of Exercise per Session:   Stress:   . Feeling of Stress :   Social Connections:   . Frequency of Communication with Friends and Family:   . Frequency of Social Gatherings with Friends and Family:   . Attends Religious Services:   . Active Member of Clubs or Organizations:   . Attends Archivist Meetings:   .  Marital Status:   Intimate Partner Violence:   . Fear of Current or Ex-Partner:   . Emotionally Abused:   Marland Kitchen Physically Abused:   . Sexually Abused:     Outpatient Medications Prior to Visit  Medication Sig Dispense Refill  . benazepril (LOTENSIN) 20 MG tablet TAKE 1 TABLET BY MOUTH EVERY DAY 90 tablet 1  . Calcium Citrate-Vitamin D (CITRACAL + D PO) Take by mouth daily.     . rosuvastatin (CRESTOR) 10 MG tablet TAKE 1 TABLET BY MOUTH EVERY DAY 90 tablet 2  . vitamin B-12 (CYANOCOBALAMIN) 500 MCG tablet Take 500 mcg by mouth every other day.      No facility-administered medications prior to visit.    Allergies  Allergen Reactions  . Bactrim [Sulfamethoxazole-Trimethoprim] Other (See Comments)    Thrush  . Codeine   . Latex   . Zoloft [Sertraline Hcl] Other (See Comments)    Chest pain    ROS Review of Systems    Objective:    Physical Exam  Constitutional: She is oriented to person, place, and time. She appears  well-developed and well-nourished.  HENT:  Head: Normocephalic and atraumatic.  Cardiovascular: Normal rate, regular rhythm and normal heart sounds.  Pulmonary/Chest: Effort normal and breath sounds normal.  Neurological: She is alert and oriented to person, place, and time.  Skin: Skin is warm and dry.  Dry rough scaling on the palm of the left hand with a little bit of the erythema and scale going in between the fingers.  Psychiatric: She has a normal mood and affect. Her behavior is normal.    BP 138/84   Pulse 84   Ht 5\' 4"  (1.626 m)   Wt 158 lb (71.7 kg)   SpO2 98%   BMI 27.12 kg/m  Wt Readings from Last 3 Encounters:  11/09/19 158 lb (71.7 kg)  09/25/19 160 lb (72.6 kg)  05/11/19 165 lb (74.8 kg)     There are no preventive care reminders to display for this patient.  There are no preventive care reminders to display for this patient.  Lab Results  Component Value Date   TSH 1.19 05/02/2018   Lab Results  Component Value Date   WBC 4.1 03/12/2016   HGB 12.8 03/12/2016   HCT 38.6 03/12/2016   MCV 90.6 03/12/2016   PLT 251 03/12/2016   Lab Results  Component Value Date   NA 141 05/22/2019   K 4.4 05/22/2019   CO2 27 05/22/2019   GLUCOSE 88 05/22/2019   BUN 16 05/22/2019   CREATININE 0.61 05/22/2019   BILITOT 0.5 05/22/2019   ALKPHOS 55 03/12/2016   AST 14 05/22/2019   ALT 14 05/22/2019   PROT 6.9 05/22/2019   ALBUMIN 4.1 03/12/2016   CALCIUM 9.5 05/22/2019   Lab Results  Component Value Date   CHOL 159 05/22/2019   Lab Results  Component Value Date   HDL 47 (L) 05/22/2019   Lab Results  Component Value Date   LDLCALC 91 05/22/2019   Lab Results  Component Value Date   TRIG 114 05/22/2019   Lab Results  Component Value Date   CHOLHDL 3.4 05/22/2019   Lab Results  Component Value Date   HGBA1C 5.3 03/12/2016      Assessment & Plan:   Problem List Items Addressed This Visit      Cardiovascular and Mediastinum   ESSENTIAL  HYPERTENSION, BENIGN - Primary    Well controlled. Continue current regimen. Follow up in  6  mo      Relevant Orders   BASIC METABOLIC PANEL WITH GFR   Urinalysis with Culture Reflex     Musculoskeletal and Integument   Dyshidrotic hand dermatitis    Gust treatment with topical steroid and then applying her lotion on top.  Just use as needed not night after night.  Did warn that excess use can actually cause some atrophy of the skin.  She really only has symptoms on her left palm and nothing on her right.  A little bit of redness and scale going in between the fingers as well so she is not improving consider fungal infection.      Relevant Medications   betamethasone valerate (VALISONE) 0.1 % cream     Other   Nocturia    Now she is going twice a night.  We discussed just getting a urine just to rule out UTI etc. though she is not having any burning or discomfort.  We also discussed cutting back on fluid intake after her evening meal to see if that makes a difference and gets her down to just urinating once at night.  If her symptoms are persisting after that and we could consider a trial of overactive bladder medication.  She says she does not really feel that she is having a lot of problems during the daytime.      Relevant Orders   Urinalysis with Culture Reflex      Meds ordered this encounter  Medications  . betamethasone valerate (VALISONE) 0.1 % cream    Sig: Apply topically at bedtime as needed.    Dispense:  45 g    Refill:  0    Follow-up: Return in about 6 months (around 05/11/2020) for Hypertension.    Beatrice Lecher, MD

## 2019-11-09 NOTE — Assessment & Plan Note (Signed)
Now she is going twice a night.  We discussed just getting a urine just to rule out UTI etc. though she is not having any burning or discomfort.  We also discussed cutting back on fluid intake after her evening meal to see if that makes a difference and gets her down to just urinating once at night.  If her symptoms are persisting after that and we could consider a trial of overactive bladder medication.  She says she does not really feel that she is having a lot of problems during the daytime.

## 2019-11-09 NOTE — Assessment & Plan Note (Signed)
Gust treatment with topical steroid and then applying her lotion on top.  Just use as needed not night after night.  Did warn that excess use can actually cause some atrophy of the skin.  She really only has symptoms on her left palm and nothing on her right.  A little bit of redness and scale going in between the fingers as well so she is not improving consider fungal infection.

## 2019-11-09 NOTE — Assessment & Plan Note (Signed)
Well controlled. Continue current regimen. Follow up in  6 mo  

## 2019-11-10 LAB — BASIC METABOLIC PANEL WITH GFR
BUN/Creatinine Ratio: 27 (calc) — ABNORMAL HIGH (ref 6–22)
BUN: 14 mg/dL (ref 7–25)
CO2: 28 mmol/L (ref 20–32)
Calcium: 9.9 mg/dL (ref 8.6–10.4)
Chloride: 108 mmol/L (ref 98–110)
Creat: 0.52 mg/dL — ABNORMAL LOW (ref 0.60–0.93)
GFR, Est African American: 109 mL/min/{1.73_m2} (ref >=60)
GFR, Est Non African American: 94 mL/min/{1.73_m2} (ref >=60)
Glucose, Bld: 117 mg/dL (ref 65–139)
Potassium: 4.4 mmol/L (ref 3.5–5.3)
Sodium: 142 mmol/L (ref 135–146)

## 2019-11-10 LAB — URINALYSIS W MICROSCOPIC + REFLEX CULTURE
Bacteria, UA: NONE SEEN /HPF
Bilirubin Urine: NEGATIVE
Glucose, UA: NEGATIVE
Hgb urine dipstick: NEGATIVE
Hyaline Cast: NONE SEEN /LPF
Ketones, ur: NEGATIVE
Leukocyte Esterase: NEGATIVE
Nitrites, Initial: NEGATIVE
Protein, ur: NEGATIVE
RBC / HPF: NONE SEEN /HPF (ref 0–2)
Specific Gravity, Urine: 1.008 (ref 1.001–1.03)
Squamous Epithelial / HPF: NONE SEEN /HPF (ref <=5)
WBC, UA: NONE SEEN /HPF (ref 0–5)
pH: 6.5 (ref 5.0–8.0)

## 2019-11-10 LAB — NO CULTURE INDICATED

## 2019-11-15 DIAGNOSIS — Z20818 Contact with and (suspected) exposure to other bacterial communicable diseases: Secondary | ICD-10-CM | POA: Diagnosis not present

## 2020-02-22 ENCOUNTER — Other Ambulatory Visit: Payer: Self-pay

## 2020-02-22 ENCOUNTER — Encounter: Payer: Self-pay | Admitting: Family Medicine

## 2020-02-22 ENCOUNTER — Ambulatory Visit (INDEPENDENT_AMBULATORY_CARE_PROVIDER_SITE_OTHER): Payer: Medicare HMO | Admitting: Family Medicine

## 2020-02-22 VITALS — BP 154/60 | HR 86 | Ht 64.0 in | Wt 159.0 lb

## 2020-02-22 DIAGNOSIS — I10 Essential (primary) hypertension: Secondary | ICD-10-CM

## 2020-02-22 DIAGNOSIS — R002 Palpitations: Secondary | ICD-10-CM

## 2020-02-22 LAB — TSH: TSH: 1.53 mIU/L (ref 0.40–4.50)

## 2020-02-22 LAB — COMPLETE METABOLIC PANEL WITH GFR
AG Ratio: 1.6 (calc) (ref 1.0–2.5)
ALT: 16 U/L (ref 6–29)
AST: 19 U/L (ref 10–35)
Albumin: 4.3 g/dL (ref 3.6–5.1)
Alkaline phosphatase (APISO): 63 U/L (ref 37–153)
BUN/Creatinine Ratio: 24 (calc) — ABNORMAL HIGH (ref 6–22)
BUN: 12 mg/dL (ref 7–25)
CO2: 30 mmol/L (ref 20–32)
Calcium: 10 mg/dL (ref 8.6–10.4)
Chloride: 109 mmol/L (ref 98–110)
Creat: 0.51 mg/dL — ABNORMAL LOW (ref 0.60–0.93)
GFR, Est African American: 110 mL/min/{1.73_m2} (ref >=60)
GFR, Est Non African American: 95 mL/min/{1.73_m2} (ref >=60)
Globulin: 2.7 g/dL (calc) (ref 1.9–3.7)
Glucose, Bld: 90 mg/dL (ref 65–99)
Potassium: 5.3 mmol/L (ref 3.5–5.3)
Sodium: 142 mmol/L (ref 135–146)
Total Bilirubin: 0.5 mg/dL (ref 0.2–1.2)
Total Protein: 7 g/dL (ref 6.1–8.1)

## 2020-02-22 LAB — CBC
HCT: 41.7 % (ref 35.0–45.0)
Hemoglobin: 13.9 g/dL (ref 11.7–15.5)
MCH: 30.9 pg (ref 27.0–33.0)
MCHC: 33.3 g/dL (ref 32.0–36.0)
MCV: 92.7 fL (ref 80.0–100.0)
MPV: 10.5 fL (ref 7.5–12.5)
Platelets: 243 10*3/uL (ref 140–400)
RBC: 4.5 10*6/uL (ref 3.80–5.10)
RDW: 11.9 % (ref 11.0–15.0)
WBC: 4.9 10*3/uL (ref 3.8–10.8)

## 2020-02-22 MED ORDER — AMBULATORY NON FORMULARY MEDICATION: 0 refills | Status: DC

## 2020-02-22 NOTE — Assessment & Plan Note (Signed)
BP normally well controlled in fact it was at goal a couple of months ago when she was here for an appointment.  So we will just monitor no changes made today

## 2020-02-22 NOTE — Addendum Note (Signed)
Addended by: Teddy Spike on: 02/22/2020 05:31 PM   Modules accepted: Orders

## 2020-02-22 NOTE — Assessment & Plan Note (Signed)
Felt symptoms on the way here today but currently asymptomatic.  EKG normal and reassuring.  We discussed moving forward with echocardiogram and Zio patch.  Suspect PVCs or PACs as possible cause.  But will need to rule out paroxysmal A. fib.

## 2020-02-22 NOTE — Progress Notes (Signed)
Acute Office Visit  Subjective:    Patient ID: Michelle Lawrence, female    DOB: Jun 06, 1946, 74 y.o.   MRN: [id number]  Chief Complaint  Patient presents with  . Irregular Heart Beat    HPI Patient is in today for irregular heart beat. Present for months.  No SOB. Notices it at rest. No CP or dizziness with the episodes. She says the episodes usually last a few minutes and MIs feels like her heart is flip-flopping when it happens. Is not necessarily stop her from doing anything. It can happen multiple times within a day. She says it actually happened on the way here today. She says she does drink a fair amount of caffeine usually couple cups of coffee in the morning as well as tea during the day. Though she has not recently changed her intake and caffeine. She has not had any recent changes in her medication regimen she says she reports she is taking her blood pressure and cholesterol pills very regularly she does still take a B12 supplement as well.  Says she just feels like she cannot relax well at night. Her mind will just go and then she will end up getting up on getting on the couch and then finally falling asleep.  Feels like has restless leg.   Past Medical History:  Diagnosis Date  . Cancer ([place]) 1981   enfometrial or cervical?  . Hyperlipidemia   . Hypertension     Past Surgical History:  Procedure Laterality Date  . LAPAROSCOPIC CHOLECYSTECTOMY  2004  . TOTAL ABDOMINAL HYSTERECTOMY  1981   w/ BSO for cancerous reasons    Family History  Problem Relation Age of Onset  . Other Mother 92       CHF  . Lung cancer Father        54  . Cancer Sister        liver  . Lung cancer Brother        exposed to agent orange.   . Hyperlipidemia Sister   . Cancer Sister        stomach  . Hypertension Daughter 63  . Hyperlipidemia Maternal Uncle     Social History   Socioeconomic History  . Marital status: Married    Spouse name: Gwyndolyn Saxon  . Number of children: 2  .  Years of education: 36  . Highest education level: High school graduate  Occupational History  . Occupation: Environmental consultant living    Comment: housekeeping  Tobacco Use  . Smoking status: Never Smoker  . Smokeless tobacco: Never Used  Vaping Use  . Vaping Use: Never used  Substance and Sexual Activity  . Alcohol use: No  . Drug use: No  . Sexual activity: Yes  Other Topics Concern  . Not on file  Social History Narrative   Works at assisted living part time   Social Determinants of Radio broadcast assistant Strain:   . Difficulty of Paying Living Expenses:   Food Insecurity:   . Worried About Charity fundraiser in the Last Year:   . Arboriculturist in the Last Year:   Transportation Needs:   . Film/video editor (Medical):   Marland Kitchen Lack of Transportation (Non-Medical):   Physical Activity:   . Days of Exercise per Week:   . Minutes of Exercise per Session:   Stress:   . Feeling of Stress :   Social Connections:   . Frequency of Communication with Friends and  Family:   . Frequency of Social Gatherings with Friends and Family:   . Attends Religious Services:   . Active Member of Clubs or Organizations:   . Attends Archivist Meetings:   Marland Kitchen Marital Status:   Intimate Partner Violence:   . Fear of Current or Ex-Partner:   . Emotionally Abused:   Marland Kitchen Physically Abused:   . Sexually Abused:     Outpatient Medications Prior to Visit  Medication Sig Dispense Refill  . benazepril (LOTENSIN) 20 MG tablet TAKE 1 TABLET BY MOUTH EVERY DAY 90 tablet 1  . betamethasone valerate (VALISONE) 0.1 % cream Apply topically at bedtime as needed. 45 g 0  . Calcium Citrate-Vitamin D (CITRACAL + D PO) Take by mouth daily.     . rosuvastatin (CRESTOR) 10 MG tablet TAKE 1 TABLET BY MOUTH EVERY DAY 90 tablet 2  . vitamin B-12 (CYANOCOBALAMIN) 500 MCG tablet Take 500 mcg by mouth every other day.      No facility-administered medications prior to visit.    Allergies  Allergen  Reactions  . Bactrim [Sulfamethoxazole-Trimethoprim] Other (See Comments)    Thrush  . Codeine   . Latex   . Zoloft [Sertraline Hcl] Other (See Comments)    Chest pain    Review of Systems     Objective:    Physical Exam Constitutional:      Appearance: She is well-developed.  HENT:     Head: Normocephalic and atraumatic.  Cardiovascular:     Rate and Rhythm: Normal rate and regular rhythm.     Heart sounds: Normal heart sounds.  Pulmonary:     Effort: Pulmonary effort is normal.     Breath sounds: Normal breath sounds.  Skin:    General: Skin is warm and dry.  Neurological:     Mental Status: She is alert and oriented to person, place, and time.  Psychiatric:        Behavior: Behavior normal.     BP (!) 154/60   Pulse 86   Ht 5\' 4"  (1.626 m)   Wt 159 lb (72.1 kg)   SpO2 98%   BMI 27.29 kg/m  Wt Readings from Last 3 Encounters:  02/22/20 159 lb (72.1 kg)  11/09/19 158 lb (71.7 kg)  09/25/19 160 lb (72.6 kg)    Health Maintenance Due  Topic Date Due  . COLONOSCOPY  Never done    There are no preventive care reminders to display for this patient.   Lab Results  Component Value Date   TSH 1.19 05/02/2018   Lab Results  Component Value Date   WBC 4.1 03/12/2016   HGB 12.8 03/12/2016   HCT 38.6 03/12/2016   MCV 90.6 03/12/2016   PLT 251 03/12/2016   Lab Results  Component Value Date   NA 142 11/09/2019   K 4.4 11/09/2019   CO2 28 11/09/2019   GLUCOSE 117 11/09/2019   BUN 14 11/09/2019   CREATININE 0.52 (L) 11/09/2019   BILITOT 0.5 05/22/2019   ALKPHOS 55 03/12/2016   AST 14 05/22/2019   ALT 14 05/22/2019   PROT 6.9 05/22/2019   ALBUMIN 4.1 03/12/2016   CALCIUM 9.9 11/09/2019   Lab Results  Component Value Date   CHOL 159 05/22/2019   Lab Results  Component Value Date   HDL 47 (L) 05/22/2019   Lab Results  Component Value Date   LDLCALC 91 05/22/2019   Lab Results  Component Value Date   TRIG 114 05/22/2019  Lab Results   Component Value Date   CHOLHDL 3.4 05/22/2019   Lab Results  Component Value Date   HGBA1C 5.3 03/12/2016       Assessment & Plan:   Problem List Items Addressed This Visit      Cardiovascular and Mediastinum   ESSENTIAL HYPERTENSION, BENIGN    BP normally well controlled in fact it was at goal a couple of months ago when she was here for an appointment.  So we will just monitor no changes made today        Other   Palpitations - Primary    Felt symptoms on the way here today but currently asymptomatic.  EKG normal and reassuring.  We discussed moving forward with echocardiogram and Zio patch.  Suspect PVCs or PACs as possible cause.  But will need to rule out paroxysmal A. fib.      Relevant Medications   AMBULATORY NON FORMULARY MEDICATION   Other Relevant Orders   CBC   COMPLETE METABOLIC PANEL WITH GFR   TSH   ECHOCARDIOGRAM COMPLETE     Encouraged her to cut back significantly on her caffeine intake. This could be contributing to her elevated blood pressure as well.  EKG shows: Rate of 85 bpm, normal sinus rhythm with no acute ST-T wave changes.  Meds ordered this encounter  Medications  . AMBULATORY NON FORMULARY MEDICATION    Sig: Medication Name: Zio Patch cardiac monitor. Fax to [facility] in [place]:  1 vial    Refill:  0     Beatrice Lecher, MD

## 2020-02-23 NOTE — Progress Notes (Signed)
All labs are normal. 

## 2020-02-29 ENCOUNTER — Other Ambulatory Visit: Payer: Self-pay | Admitting: Family Medicine

## 2020-02-29 DIAGNOSIS — E7800 Pure hypercholesterolemia, unspecified: Secondary | ICD-10-CM

## 2020-02-29 DIAGNOSIS — Z1231 Encounter for screening mammogram for malignant neoplasm of breast: Secondary | ICD-10-CM

## 2020-03-01 ENCOUNTER — Telehealth: Payer: Self-pay | Admitting: Family Medicine

## 2020-03-01 DIAGNOSIS — R002 Palpitations: Secondary | ICD-10-CM

## 2020-03-01 NOTE — Telephone Encounter (Signed)
Changed orders for Zio patch and echocardiogram to our [facility] location.  Local [facility] cardiology was unable to offer Zio patch.

## 2020-03-07 ENCOUNTER — Ambulatory Visit: Payer: Medicare HMO

## 2020-03-11 ENCOUNTER — Ambulatory Visit: Payer: Medicare HMO | Admitting: Family Medicine

## 2020-03-28 ENCOUNTER — Ambulatory Visit: Payer: Medicare HMO

## 2020-04-11 ENCOUNTER — Other Ambulatory Visit: Payer: Self-pay | Admitting: Family Medicine

## 2020-04-11 DIAGNOSIS — I10 Essential (primary) hypertension: Secondary | ICD-10-CM

## 2020-04-12 ENCOUNTER — Ambulatory Visit (HOSPITAL_BASED_OUTPATIENT_CLINIC_OR_DEPARTMENT_OTHER)
Admission: RE | Admit: 2020-04-12 | Discharge: 2020-04-12 | Disposition: A | Discharge status: Home / Self Care | Payer: Medicare HMO | Source: Ambulatory Visit | Attending: Family Medicine | Admitting: Family Medicine

## 2020-04-12 DIAGNOSIS — R002 Palpitations: Secondary | ICD-10-CM | POA: Diagnosis not present

## 2020-04-12 DIAGNOSIS — I10 Essential (primary) hypertension: Secondary | ICD-10-CM | POA: Diagnosis not present

## 2020-04-12 DIAGNOSIS — E785 Hyperlipidemia, unspecified: Secondary | ICD-10-CM | POA: Insufficient documentation

## 2020-04-12 DIAGNOSIS — R0609 Other forms of dyspnea: Secondary | ICD-10-CM | POA: Insufficient documentation

## 2020-04-12 LAB — ECHOCARDIOGRAM COMPLETE
AR max vel: 1.72 cm2
AV Area VTI: 1.95 cm2
AV Area mean vel: 1.85 cm2
AV Mean grad: 5 mmHg
AV Peak grad: 11.7 mmHg
Ao pk vel: 1.71 m/s
Area-P 1/2: 3.77 cm2
Calc EF: 74.8 %
S' Lateral: 2.69 cm
Single Plane A2C EF: 80.5 %
Single Plane A4C EF: 68.8 %

## 2020-04-15 DIAGNOSIS — H5203 Hypermetropia, bilateral: Secondary | ICD-10-CM | POA: Diagnosis not present

## 2020-05-09 ENCOUNTER — Ambulatory Visit (INDEPENDENT_AMBULATORY_CARE_PROVIDER_SITE_OTHER): Payer: Medicare HMO

## 2020-05-09 ENCOUNTER — Other Ambulatory Visit: Payer: Self-pay

## 2020-05-09 DIAGNOSIS — Z1231 Encounter for screening mammogram for malignant neoplasm of breast: Secondary | ICD-10-CM

## 2020-05-13 ENCOUNTER — Ambulatory Visit (INDEPENDENT_AMBULATORY_CARE_PROVIDER_SITE_OTHER): Payer: Medicare HMO | Admitting: Family Medicine

## 2020-05-13 ENCOUNTER — Other Ambulatory Visit: Payer: Self-pay

## 2020-05-13 ENCOUNTER — Other Ambulatory Visit: Payer: Self-pay | Admitting: Family Medicine

## 2020-05-13 ENCOUNTER — Encounter: Payer: Self-pay | Admitting: Family Medicine

## 2020-05-13 VITALS — BP 145/69

## 2020-05-13 DIAGNOSIS — I10 Essential (primary) hypertension: Secondary | ICD-10-CM | POA: Diagnosis not present

## 2020-05-13 DIAGNOSIS — E7800 Pure hypercholesterolemia, unspecified: Secondary | ICD-10-CM | POA: Diagnosis not present

## 2020-05-13 DIAGNOSIS — R928 Other abnormal and inconclusive findings on diagnostic imaging of breast: Secondary | ICD-10-CM

## 2020-05-13 DIAGNOSIS — Z1211 Encounter for screening for malignant neoplasm of colon: Secondary | ICD-10-CM | POA: Diagnosis not present

## 2020-05-13 DIAGNOSIS — R002 Palpitations: Secondary | ICD-10-CM

## 2020-05-13 DIAGNOSIS — Z23 Encounter for immunization: Secondary | ICD-10-CM

## 2020-05-13 MED ORDER — ROSUVASTATIN CALCIUM 10 MG PO TABS: 10.0000 mg | ORAL_TABLET | Freq: Every day | ORAL | 3 refills | Status: DC

## 2020-05-13 NOTE — Assessment & Plan Note (Signed)
Well controlled. Continue current regimen. Follow up in  6 mo  

## 2020-05-13 NOTE — Progress Notes (Signed)
Established Patient Office Visit  Subjective:  Patient ID: Michelle Lawrence, female    DOB: [birth year]  Age: 74 y.o. MRN: [id number]  CC: No chief complaint on file.   HPI Michelle Lawrence presents for   Hypertension- Pt denies chest pain, SOB, dizziness, or heart palpitations.  Taking meds as directed w/o problems.  Denies medication side effects.    Follow-up palpitations-last time she was here she had complained of some frequent palpitations she did drink a fair amount of caffeine.  We did an echocardiogram which did not reveal any worrisome findings.  I had actually ordered a Zio patch but it was not performed.  Evidently had ordered it for the wrong location so the order was not processed.   Past Medical History:  Diagnosis Date  . Cancer ([place]) 1981   enfometrial or cervical?  . Hyperlipidemia   . Hypertension     Past Surgical History:  Procedure Laterality Date  . LAPAROSCOPIC CHOLECYSTECTOMY  2004  . TOTAL ABDOMINAL HYSTERECTOMY  1981   w/ BSO for cancerous reasons    Family History  Problem Relation Age of Onset  . Other Mother 46       CHF  . Lung cancer Father        9  . Cancer Sister        liver  . Lung cancer Brother        exposed to agent orange.   . Hyperlipidemia Sister   . Cancer Sister        stomach  . Hypertension Daughter 89  . Hyperlipidemia Maternal Uncle     Social History   Socioeconomic History  . Marital status: Married    Spouse name: Gwyndolyn Saxon  . Number of children: 2  . Years of education: 41  . Highest education level: High school graduate  Occupational History  . Occupation: Environmental consultant living    Comment: housekeeping  Tobacco Use  . Smoking status: Never Smoker  . Smokeless tobacco: Never Used  Vaping Use  . Vaping Use: Never used  Substance and Sexual Activity  . Alcohol use: No  . Drug use: No  . Sexual activity: Yes  Other Topics Concern  . Not on file  Social History Narrative   Works at assisted living part  time   Social Determinants of Radio broadcast assistant Strain:   . Difficulty of Paying Living Expenses: Not on file  Food Insecurity:   . Worried About Charity fundraiser in the Last Year: Not on file  . Ran Out of Food in the Last Year: Not on file  Transportation Needs:   . Lack of Transportation (Medical): Not on file  . Lack of Transportation (Non-Medical): Not on file  Physical Activity:   . Days of Exercise per Week: Not on file  . Minutes of Exercise per Session: Not on file  Stress:   . Feeling of Stress : Not on file  Social Connections:   . Frequency of Communication with Friends and Family: Not on file  . Frequency of Social Gatherings with Friends and Family: Not on file  . Attends Religious Services: Not on file  . Active Member of Clubs or Organizations: Not on file  . Attends Archivist Meetings: Not on file  . Marital Status: Not on file  Intimate Partner Violence:   . Fear of Current or Ex-Partner: Not on file  . Emotionally Abused: Not on file  . Physically Abused: Not  on file  . Sexually Abused: Not on file    Outpatient Medications Prior to Visit  Medication Sig Dispense Refill  . AMBULATORY NON FORMULARY MEDICATION Medication Name: Zio Patch cardiac monitor. Fax to [facility] in [place] 1 vial 0  . benazepril (LOTENSIN) 20 MG tablet TAKE 1 TABLET BY MOUTH EVERY DAY 90 tablet 1  . betamethasone valerate (VALISONE) 0.1 % cream Apply topically at bedtime as needed. 45 g 0  . Calcium Citrate-Vitamin D (CITRACAL + D PO) Take by mouth daily.     . vitamin B-12 (CYANOCOBALAMIN) 500 MCG tablet Take 500 mcg by mouth every other day.     . rosuvastatin (CRESTOR) 10 MG tablet TAKE 1 TABLET BY MOUTH EVERY DAY 90 tablet 0   No facility-administered medications prior to visit.    Allergies  Allergen Reactions  . Bactrim [Sulfamethoxazole-Trimethoprim] Other (See Comments)    Thrush  . Codeine   . Latex   . Zoloft [Sertraline Hcl] Other (See Comments)     Chest pain    ROS Review of Systems    Objective:    Physical Exam Constitutional:      Appearance: She is well-developed.  HENT:     Head: Normocephalic and atraumatic.  Cardiovascular:     Rate and Rhythm: Normal rate and regular rhythm.     Heart sounds: Normal heart sounds.  Pulmonary:     Effort: Pulmonary effort is normal.     Breath sounds: Normal breath sounds.  Skin:    General: Skin is warm and dry.  Neurological:     Mental Status: She is alert and oriented to person, place, and time.  Psychiatric:        Behavior: Behavior normal.     BP (!) 145/69  Wt Readings from Last 3 Encounters:  02/22/20 159 lb (72.1 kg)  11/09/19 158 lb (71.7 kg)  09/25/19 160 lb (72.6 kg)     Health Maintenance Due  Topic Date Due  . COLON CANCER SCREENING ANNUAL FOBT  10/30/2018    There are no preventive care reminders to display for this patient.  Lab Results  Component Value Date   TSH 1.53 02/22/2020   Lab Results  Component Value Date   WBC 4.9 02/22/2020   HGB 13.9 02/22/2020   HCT 41.7 02/22/2020   MCV 92.7 02/22/2020   PLT 243 02/22/2020   Lab Results  Component Value Date   NA 142 02/22/2020   K 5.3 02/22/2020   CO2 30 02/22/2020   GLUCOSE 90 02/22/2020   BUN 12 02/22/2020   CREATININE 0.51 (L) 02/22/2020   BILITOT 0.5 02/22/2020   ALKPHOS 55 03/12/2016   AST 19 02/22/2020   ALT 16 02/22/2020   PROT 7.0 02/22/2020   ALBUMIN 4.1 03/12/2016   CALCIUM 10.0 02/22/2020   Lab Results  Component Value Date   CHOL 159 05/22/2019   Lab Results  Component Value Date   HDL 47 (L) 05/22/2019   Lab Results  Component Value Date   LDLCALC 91 05/22/2019   Lab Results  Component Value Date   TRIG 114 05/22/2019   Lab Results  Component Value Date   CHOLHDL 3.4 05/22/2019   Lab Results  Component Value Date   HGBA1C 5.3 03/12/2016       Assessment & Plan:   Problem List Items Addressed This Visit      Cardiovascular and Mediastinum    ESSENTIAL HYPERTENSION, BENIGN - Primary    Well controlled. Continue current  regimen. Follow up in  64mo      Relevant Medications   rosuvastatin (CRESTOR) 10 MG tablet   Other Relevant Orders   Lipid Panel w/reflex Direct LDL   BASIC METABOLIC PANEL WITH GFR     Other   Palpitations    Echocardiogram did not show anything worrisome.  She says for now the palpitations seem to have resolved.  I really want her to keep an eye on limiting her caffeine intake.  But if they return we will get her scheduled for a Zio patch.      Relevant Orders   Lipid Panel w/reflex Direct LDL   BASIC METABOLIC PANEL WITH GFR   Hyperlipemia    To recheck lipids next month.  Given fasting lab slip.      Relevant Medications   rosuvastatin (CRESTOR) 10 MG tablet   Other Relevant Orders   Lipid Panel w/reflex Direct LDL   BASIC METABOLIC PANEL WITH GFR    Other Visit Diagnoses    Colon cancer screening       Need for immunization against influenza       Relevant Orders   Flu Vaccine QUAD High Dose(Fluad) (Completed)      Discussed need for colon cancer screening.  He has a stool cards at home so just reminded her to complete them and return them.  Flu Vaccine given today.  Meds ordered this encounter  Medications  . rosuvastatin (CRESTOR) 10 MG tablet    Sig: Take 1 tablet (10 mg total) by mouth daily.    Dispense:  90 tablet    Refill:  3    Follow-up: Return in about 6 months (around 11/10/2020) for Hypertension.    Beatrice Lecher, MD

## 2020-05-13 NOTE — Patient Instructions (Signed)
Please check with your health insurance to see if they might help cover a home blood pressure cuff so that you can check it at home. Please return the stool cards for colon cancer screening.

## 2020-05-13 NOTE — Assessment & Plan Note (Signed)
Echocardiogram did not show anything worrisome.  She says for now the palpitations seem to have resolved.  I really want her to keep an eye on limiting her caffeine intake.  But if they return we will get her scheduled for a Zio patch.

## 2020-05-13 NOTE — Assessment & Plan Note (Signed)
To recheck lipids next month.  Given fasting lab slip.

## 2020-05-20 DIAGNOSIS — E7800 Pure hypercholesterolemia, unspecified: Secondary | ICD-10-CM | POA: Diagnosis not present

## 2020-05-20 DIAGNOSIS — I10 Essential (primary) hypertension: Secondary | ICD-10-CM | POA: Diagnosis not present

## 2020-05-20 DIAGNOSIS — R002 Palpitations: Secondary | ICD-10-CM | POA: Diagnosis not present

## 2020-05-21 LAB — LIPID PANEL W/REFLEX DIRECT LDL
Cholesterol: 161 mg/dL (ref <200)
HDL: 51 mg/dL (ref >=50)
LDL Cholesterol (Calc): 92 mg/dL (calc)
Non-HDL Cholesterol (Calc): 110 mg/dL (calc) (ref <130)
Total CHOL/HDL Ratio: 3.2 (calc) (ref <5.0)
Triglycerides: 88 mg/dL (ref <150)

## 2020-05-21 LAB — BASIC METABOLIC PANEL WITH GFR
BUN/Creatinine Ratio: 22 (calc) (ref 6–22)
BUN: 12 mg/dL (ref 7–25)
CO2: 27 mmol/L (ref 20–32)
Calcium: 9.9 mg/dL (ref 8.6–10.4)
Chloride: 106 mmol/L (ref 98–110)
Creat: 0.54 mg/dL — ABNORMAL LOW (ref 0.60–0.93)
GFR, Est African American: 108 mL/min/{1.73_m2} (ref >=60)
GFR, Est Non African American: 93 mL/min/{1.73_m2} (ref >=60)
Glucose, Bld: 84 mg/dL (ref 65–99)
Potassium: 5 mmol/L (ref 3.5–5.3)
Sodium: 141 mmol/L (ref 135–146)

## 2020-05-24 ENCOUNTER — Ambulatory Visit: Payer: Medicare HMO | Admitting: Family Medicine

## 2020-05-27 ENCOUNTER — Telehealth: Payer: Self-pay

## 2020-05-27 ENCOUNTER — Telehealth: Payer: Self-pay | Admitting: Neurology

## 2020-05-27 ENCOUNTER — Ambulatory Visit (INDEPENDENT_AMBULATORY_CARE_PROVIDER_SITE_OTHER): Payer: Medicare HMO | Admitting: Family Medicine

## 2020-05-27 VITALS — BP 146/60 | HR 62

## 2020-05-27 DIAGNOSIS — I10 Essential (primary) hypertension: Secondary | ICD-10-CM | POA: Diagnosis not present

## 2020-05-27 MED ORDER — BENAZEPRIL HCL 40 MG PO TABS: 40.0000 mg | ORAL_TABLET | Freq: Every day | ORAL | 0 refills | Status: DC

## 2020-05-27 NOTE — Progress Notes (Signed)
Patient comes in today for blood pressure check.   Michelle Lawrence is taking Benazepril 20 mg daily for blood pressure control. She denies any missed doses, side effects, headaches, chest pain, palpitations, dizziness, or shortness of breath.   [name] hasn't been checking blood pressure readings at home.  Her first blood pressure reading today is: 148/67. After sitting, her second reading is: 146/60.   Her prior reading in the office on 05/13/2020 was 145/69. She does report that since the weather is cooling off she isn't walking anymore.   I advised patient I would send this note to Dr. Madilyn Fireman and let her know if any changes are needed.

## 2020-05-27 NOTE — Telephone Encounter (Signed)
Patient called back and was made aware. Documented in separate encounter by nursing office.

## 2020-05-27 NOTE — Telephone Encounter (Signed)
Patient seen for nurse visit today for HTN.   Note from: Hali Marry, MD at 05/27/2020 9:00 AM  Status: Signed    HTN - not well controlled.  Increase benazepril to 40mg . Can take 2 of current tabs at the same time one a day. I also sent a new script to the pharmacy for the 40mg  tab.  Repeat BP check in 3-4 weeks.      Left message on machine for patient to call back let her know.

## 2020-05-27 NOTE — Telephone Encounter (Signed)
Pt has been updated that benazepril dose has been increased to 40 mg. Aware that she has an updated rx at the pharmacy. Pt will contact the clinic to schedule a blood pressure check in 3 - 4 wks. No other inquiries during the call.

## 2020-05-27 NOTE — Progress Notes (Signed)
Patient has been made aware of new instructions.

## 2020-05-27 NOTE — Progress Notes (Signed)
HTN - not well controlled.  Increase benazepril to 40mg . Can take 2 of current tabs at the same time one a day. I also sent a new script to the pharmacy for the 40mg  tab.  Repeat BP check in 3-4 weeks.

## 2020-05-30 ENCOUNTER — Other Ambulatory Visit: Payer: Medicare HMO

## 2020-05-31 LAB — POC HEMOCCULT BLD/STL (HOME/3-CARD/SCREEN)
Card #2 Fecal Occult Blod, POC: NEGATIVE
Card #3 Fecal Occult Blood, POC: NEGATIVE
Fecal Occult Blood, POC: NEGATIVE

## 2020-06-06 ENCOUNTER — Ambulatory Visit: Payer: Medicare HMO

## 2020-06-06 ENCOUNTER — Other Ambulatory Visit: Payer: Self-pay

## 2020-06-06 ENCOUNTER — Ambulatory Visit
Admission: RE | Admit: 2020-06-06 | Discharge: 2020-06-06 | Disposition: A | Discharge status: Home / Self Care | Payer: Medicare HMO | Source: Ambulatory Visit | Attending: Family Medicine | Admitting: Family Medicine

## 2020-06-06 DIAGNOSIS — R922 Inconclusive mammogram: Secondary | ICD-10-CM | POA: Diagnosis not present

## 2020-06-06 DIAGNOSIS — R928 Other abnormal and inconclusive findings on diagnostic imaging of breast: Secondary | ICD-10-CM

## 2020-06-12 ENCOUNTER — Other Ambulatory Visit: Payer: Self-pay | Admitting: *Deleted

## 2020-06-12 DIAGNOSIS — Z1211 Encounter for screening for malignant neoplasm of colon: Secondary | ICD-10-CM

## 2020-06-17 ENCOUNTER — Ambulatory Visit (INDEPENDENT_AMBULATORY_CARE_PROVIDER_SITE_OTHER): Payer: Medicare HMO | Admitting: Family Medicine

## 2020-06-17 ENCOUNTER — Other Ambulatory Visit: Payer: Self-pay

## 2020-06-17 VITALS — BP 134/65 | HR 69

## 2020-06-17 DIAGNOSIS — I10 Essential (primary) hypertension: Secondary | ICD-10-CM

## 2020-06-17 NOTE — Progress Notes (Signed)
Established Patient Office Visit  Subjective:  Patient ID: Michelle Lawrence, female    DOB: 1946/04/11  Age: 74 y.o. MRN: [id number]  CC:  Chief Complaint  Patient presents with  . Hypertension    HPI Michelle Lawrence presents for blood pressure check. Denies chest pain, shortness of breath or headaches.   Past Medical History:  Diagnosis Date  . Cancer ([place]) 1981   enfometrial or cervical?  . Hyperlipidemia   . Hypertension     Past Surgical History:  Procedure Laterality Date  . LAPAROSCOPIC CHOLECYSTECTOMY  2004  . TOTAL ABDOMINAL HYSTERECTOMY  1981   w/ BSO for cancerous reasons    Family History  Problem Relation Age of Onset  . Other Mother 13       CHF  . Lung cancer Father        53  . Cancer Sister        liver  . Breast cancer Sister   . Lung cancer Brother        exposed to agent orange.   . Hyperlipidemia Sister   . Cancer Sister        stomach  . Hypertension Daughter 37  . Hyperlipidemia Maternal Uncle     Social History   Socioeconomic History  . Marital status: Married    Spouse name: Gwyndolyn Saxon  . Number of children: 2  . Years of education: 55  . Highest education level: High school graduate  Occupational History  . Occupation: Environmental consultant living    Comment: housekeeping  Tobacco Use  . Smoking status: Never Smoker  . Smokeless tobacco: Never Used  Vaping Use  . Vaping Use: Never used  Substance and Sexual Activity  . Alcohol use: No  . Drug use: No  . Sexual activity: Yes  Other Topics Concern  . Not on file  Social History Narrative   Works at assisted living part time   Social Determinants of Radio broadcast assistant Strain:   . Difficulty of Paying Living Expenses: Not on file  Food Insecurity:   . Worried About Charity fundraiser in the Last Year: Not on file  . Ran Out of Food in the Last Year: Not on file  Transportation Needs:   . Lack of Transportation (Medical): Not on file  . Lack of Transportation  (Non-Medical): Not on file  Physical Activity:   . Days of Exercise per Week: Not on file  . Minutes of Exercise per Session: Not on file  Stress:   . Feeling of Stress : Not on file  Social Connections:   . Frequency of Communication with Friends and Family: Not on file  . Frequency of Social Gatherings with Friends and Family: Not on file  . Attends Religious Services: Not on file  . Active Member of Clubs or Organizations: Not on file  . Attends Archivist Meetings: Not on file  . Marital Status: Not on file  Intimate Partner Violence:   . Fear of Current or Ex-Partner: Not on file  . Emotionally Abused: Not on file  . Physically Abused: Not on file  . Sexually Abused: Not on file    Outpatient Medications Prior to Visit  Medication Sig Dispense Refill  . benazepril (LOTENSIN) 40 MG tablet Take 1 tablet (40 mg total) by mouth daily. 90 tablet 0  . betamethasone valerate (VALISONE) 0.1 % cream Apply topically at bedtime as needed. 45 g 0  . Calcium Citrate-Vitamin D (CITRACAL +  D PO) Take by mouth daily.     . rosuvastatin (CRESTOR) 10 MG tablet Take 1 tablet (10 mg total) by mouth daily. 90 tablet 3  . vitamin B-12 (CYANOCOBALAMIN) 500 MCG tablet Take 500 mcg by mouth every other day.      No facility-administered medications prior to visit.    Allergies  Allergen Reactions  . Bactrim [Sulfamethoxazole-Trimethoprim] Other (See Comments)    Thrush  . Codeine   . Latex   . Zoloft [Sertraline Hcl] Other (See Comments)    Chest pain    ROS Review of Systems    Objective:    Physical Exam  BP 134/65   Pulse 69   SpO2 96%  Wt Readings from Last 3 Encounters:  02/22/20 159 lb (72.1 kg)  11/09/19 158 lb (71.7 kg)  09/25/19 160 lb (72.6 kg)     There are no preventive care reminders to display for this patient.  There are no preventive care reminders to display for this patient.  Lab Results  Component Value Date   TSH 1.53 02/22/2020   Lab  Results  Component Value Date   WBC 4.9 02/22/2020   HGB 13.9 02/22/2020   HCT 41.7 02/22/2020   MCV 92.7 02/22/2020   PLT 243 02/22/2020   Lab Results  Component Value Date   NA 141 05/20/2020   K 5.0 05/20/2020   CO2 27 05/20/2020   GLUCOSE 84 05/20/2020   BUN 12 05/20/2020   CREATININE 0.54 (L) 05/20/2020   BILITOT 0.5 02/22/2020   ALKPHOS 55 03/12/2016   AST 19 02/22/2020   ALT 16 02/22/2020   PROT 7.0 02/22/2020   ALBUMIN 4.1 03/12/2016   CALCIUM 9.9 05/20/2020   Lab Results  Component Value Date   CHOL 161 05/20/2020   Lab Results  Component Value Date   HDL 51 05/20/2020   Lab Results  Component Value Date   LDLCALC 92 05/20/2020   Lab Results  Component Value Date   TRIG 88 05/20/2020   Lab Results  Component Value Date   CHOLHDL 3.2 05/20/2020   Lab Results  Component Value Date   HGBA1C 5.3 03/12/2016      Assessment & Plan:  HTN - blood pressure controlled. Patient advised to follow up on HTN with Dr Madilyn Fireman in 3 months.    Problem List Items Addressed This Visit    ESSENTIAL HYPERTENSION, BENIGN - Primary      No orders of the defined types were placed in this encounter.   Follow-up: Return in about 3 months (around 09/17/2020) for HTN with Dr Madilyn Fireman.    Lavell Luster, [place]

## 2020-06-17 NOTE — Progress Notes (Signed)
Agree with documentation as above.   Tina Gruner, MD  

## 2020-08-16 ENCOUNTER — Other Ambulatory Visit: Payer: Self-pay | Admitting: Family Medicine

## 2020-08-16 DIAGNOSIS — I10 Essential (primary) hypertension: Secondary | ICD-10-CM

## 2020-08-19 ENCOUNTER — Other Ambulatory Visit: Payer: Self-pay

## 2020-08-19 ENCOUNTER — Ambulatory Visit (INDEPENDENT_AMBULATORY_CARE_PROVIDER_SITE_OTHER): Payer: Medicare HMO | Admitting: Family Medicine

## 2020-08-19 ENCOUNTER — Encounter: Payer: Self-pay | Admitting: Family Medicine

## 2020-08-19 VITALS — BP 126/66 | HR 77 | Wt 164.4 lb

## 2020-08-19 DIAGNOSIS — N898 Other specified noninflammatory disorders of vagina: Secondary | ICD-10-CM

## 2020-08-19 DIAGNOSIS — R350 Frequency of micturition: Secondary | ICD-10-CM | POA: Diagnosis not present

## 2020-08-19 LAB — POCT URINALYSIS DIP (CLINITEK)
Bilirubin, UA: NEGATIVE
Glucose, UA: NEGATIVE mg/dL
Ketones, POC UA: NEGATIVE mg/dL
Nitrite, UA: NEGATIVE
POC PROTEIN,UA: NEGATIVE
Spec Grav, UA: 1.02 (ref 1.010–1.025)
Urobilinogen, UA: 0.2 E.U./dL
pH, UA: 6.5 (ref 5.0–8.0)

## 2020-08-19 MED ORDER — NITROFURANTOIN MONOHYD MACRO 100 MG PO CAPS
100.0000 mg | ORAL_CAPSULE | Freq: Two times a day (BID) | ORAL | 0 refills | Status: DC
Start: 2020-08-19 — End: 2020-12-05

## 2020-08-19 NOTE — Progress Notes (Addendum)
Acute Office Visit  Subjective:    Patient ID: Michelle Lawrence, female    DOB: 1946/05/02, 75 y.o.   MRN: [id number]  No chief complaint on file.     HPI Patient is in today for urinary frequency and urgency for about 1 week.  She feels like a pressure of having to go but no pelvic pain or back pain.  No fevers chills or sweats. She has also had some vaginal itching at night.   Past Medical History:  Diagnosis Date  . Cancer ([place]) 1981   enfometrial or cervical?  . Hyperlipidemia   . Hypertension     Past Surgical History:  Procedure Laterality Date  . LAPAROSCOPIC CHOLECYSTECTOMY  2004  . TOTAL ABDOMINAL HYSTERECTOMY  1981   w/ BSO for cancerous reasons    Family History  Problem Relation Age of Onset  . Other Mother 77       CHF  . Lung cancer Father        67  . Cancer Sister        liver  . Breast cancer Sister   . Lung cancer Brother        exposed to agent orange.   . Hyperlipidemia Sister   . Cancer Sister        stomach  . Hypertension Daughter 48  . Hyperlipidemia Maternal Uncle     Social History   Socioeconomic History  . Marital status: Married    Spouse name: Gwyndolyn Saxon  . Number of children: 2  . Years of education: 87  . Highest education level: High school graduate  Occupational History  . Occupation: Environmental consultant living    Comment: housekeeping  Tobacco Use  . Smoking status: Never Smoker  . Smokeless tobacco: Never Used  Vaping Use  . Vaping Use: Never used  Substance and Sexual Activity  . Alcohol use: No  . Drug use: No  . Sexual activity: Yes  Other Topics Concern  . Not on file  Social History Narrative   Works at assisted living part time   Social Determinants of Radio broadcast assistant Strain: Not on file  Food Insecurity: Not on file  Transportation Needs: Not on file  Physical Activity: Not on file  Stress: Not on file  Social Connections: Not on file  Intimate Partner Violence: Not on file    Outpatient  Medications Prior to Visit  Medication Sig Dispense Refill  . benazepril (LOTENSIN) 40 MG tablet TAKE 1 TABLET BY MOUTH EVERY DAY 90 tablet 0  . betamethasone valerate (VALISONE) 0.1 % cream Apply topically at bedtime as needed. 45 g 0  . Calcium Citrate-Vitamin D (CITRACAL + D PO) Take by mouth daily.     . rosuvastatin (CRESTOR) 10 MG tablet Take 1 tablet (10 mg total) by mouth daily. 90 tablet 3  . vitamin B-12 (CYANOCOBALAMIN) 500 MCG tablet Take 500 mcg by mouth every other day.     No facility-administered medications prior to visit.    Allergies  Allergen Reactions  . Bactrim [Sulfamethoxazole-Trimethoprim] Other (See Comments)    Thrush  . Codeine   . Latex   . Macrobid [Nitrofurantoin] Other (See Comments)    Chest Pain  . Zoloft [Sertraline Hcl] Other (See Comments)    Chest pain    Review of Systems     Objective:    Physical Exam Vitals reviewed.  Constitutional:      Appearance: She is well-developed and well-nourished.  HENT:  Head: Normocephalic and atraumatic.  Eyes:     Extraocular Movements: EOM normal.     Conjunctiva/sclera: Conjunctivae normal.  Cardiovascular:     Rate and Rhythm: Normal rate.  Pulmonary:     Effort: Pulmonary effort is normal.  Skin:    General: Skin is dry.     Coloration: Skin is not pale.  Neurological:     Mental Status: She is alert and oriented to person, place, and time.  Psychiatric:        Mood and Affect: Mood and affect normal.        Behavior: Behavior normal.     BP 126/66   Pulse 77   Wt 164 lb 6.4 oz (74.6 kg)   SpO2 96%   BMI 28.22 kg/m  Wt Readings from Last 3 Encounters:  08/19/20 164 lb 6.4 oz (74.6 kg)  02/22/20 159 lb (72.1 kg)  11/09/19 158 lb (71.7 kg)    There are no preventive care reminders to display for this patient.  There are no preventive care reminders to display for this patient.   Lab Results  Component Value Date   TSH 1.53 02/22/2020   Lab Results  Component Value  Date   WBC 4.9 02/22/2020   HGB 13.9 02/22/2020   HCT 41.7 02/22/2020   MCV 92.7 02/22/2020   PLT 243 02/22/2020   Lab Results  Component Value Date   NA 141 05/20/2020   K 5.0 05/20/2020   CO2 27 05/20/2020   GLUCOSE 84 05/20/2020   BUN 12 05/20/2020   CREATININE 0.54 (L) 05/20/2020   BILITOT 0.5 02/22/2020   ALKPHOS 55 03/12/2016   AST 19 02/22/2020   ALT 16 02/22/2020   PROT 7.0 02/22/2020   ALBUMIN 4.1 03/12/2016   CALCIUM 9.9 05/20/2020   Lab Results  Component Value Date   CHOL 161 05/20/2020   Lab Results  Component Value Date   HDL 51 05/20/2020   Lab Results  Component Value Date   LDLCALC 92 05/20/2020   Lab Results  Component Value Date   TRIG 88 05/20/2020   Lab Results  Component Value Date   CHOLHDL 3.2 05/20/2020   Lab Results  Component Value Date   HGBA1C 5.3 03/12/2016       Assessment & Plan:   Problem List Items Addressed This Visit   None   Visit Diagnoses    Urine frequency    -  Primary   Relevant Medications   nitrofurantoin, macrocrystal-monohydrate, (MACROBID) 100 MG capsule   Other Relevant Orders   POCT URINALYSIS DIP (CLINITEK) (Completed)   Vagina itching       Relevant Orders   Urine Culture (Completed)   WET PREP FOR TRICH, YEAST, CLUE (Completed)     Urinary frequency and urgency x1 week-we will go ahead and treat while awaiting the urine culture.  His urinalysis was only positive for leukocytes and blood.  Will tx with macrobid.  Call if symptoms worsen or not improving.  Vaginal  itching-wet prep performed will call with results once available.  Meds ordered this encounter  Medications  . nitrofurantoin, macrocrystal-monohydrate, (MACROBID) 100 MG capsule    Sig: Take 1 capsule (100 mg total) by mouth 2 (two) times daily.    Dispense:  10 capsule    Refill:  0  . amoxicillin-clavulanate (AUGMENTIN) 875-125 MG tablet    Sig: Take 1 tablet by mouth 2 (two) times daily.    Dispense:  6 tablet    Refill:  0      Beatrice Lecher, MD

## 2020-08-20 LAB — WET PREP FOR TRICH, YEAST, CLUE
MICRO NUMBER:: 11375887
Specimen Quality: ADEQUATE

## 2020-08-21 LAB — URINE CULTURE
MICRO NUMBER:: 11378231
SPECIMEN QUALITY:: ADEQUATE

## 2020-08-23 ENCOUNTER — Encounter: Payer: Self-pay | Admitting: Family Medicine

## 2020-08-23 MED ORDER — AMOXICILLIN-POT CLAVULANATE 875-125 MG PO TABS: 1.0000 | ORAL_TABLET | Freq: Two times a day (BID) | ORAL | 0 refills | Status: DC

## 2020-08-23 NOTE — Addendum Note (Signed)
Addended by: Beatrice Lecher D on: 08/23/2020 07:46 AM   Modules accepted: Orders

## 2020-08-29 ENCOUNTER — Other Ambulatory Visit: Payer: Self-pay

## 2020-08-29 DIAGNOSIS — L301 Dyshidrosis [pompholyx]: Secondary | ICD-10-CM

## 2020-08-29 MED ORDER — BETAMETHASONE VALERATE 0.1 % EX CREA: TOPICAL_CREAM | Freq: Every evening | CUTANEOUS | 0 refills | Status: DC | PRN

## 2020-09-30 ENCOUNTER — Other Ambulatory Visit: Payer: Self-pay

## 2020-09-30 ENCOUNTER — Encounter: Payer: Self-pay | Admitting: Family Medicine

## 2020-09-30 ENCOUNTER — Ambulatory Visit (INDEPENDENT_AMBULATORY_CARE_PROVIDER_SITE_OTHER): Payer: Medicare HMO | Admitting: Family Medicine

## 2020-09-30 VITALS — BP 132/78 | HR 83 | Ht 64.0 in | Wt 163.0 lb

## 2020-09-30 DIAGNOSIS — R21 Rash and other nonspecific skin eruption: Secondary | ICD-10-CM | POA: Diagnosis not present

## 2020-09-30 DIAGNOSIS — L723 Sebaceous cyst: Secondary | ICD-10-CM

## 2020-09-30 MED ORDER — MUPIROCIN 2 % EX OINT: TOPICAL_OINTMENT | Freq: Two times a day (BID) | CUTANEOUS | 0 refills | Status: DC | PRN

## 2020-09-30 MED ORDER — TERBINAFINE HCL 1 % EX CREA
1.0000 | TOPICAL_CREAM | Freq: Two times a day (BID) | CUTANEOUS | 0 refills | Status: DC
Start: 2020-09-30 — End: 2021-05-01

## 2020-09-30 NOTE — Progress Notes (Signed)
Established Patient Office Visit  Subjective:  Patient ID: Michelle Lawrence, female    DOB: 04-May-1946  Age: 75 y.o. MRN: [id number]  CC:  Chief Complaint  Patient presents with  . Rash    R foot     HPI Michelle Lawrence presents for Rash on right foot. She has been using a steroid cram on it, but not helping.  Occ itches.  The rash just didn't go away.  She says is been there for a couple of months.  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  She also has an inflammed follicle on the left groin crease.  Has been there for couple weeks she says is been getting bigger and then smaller in size.  Has been tender.  She is not sure if it drained or not but thinks it may have.  She has had problems with folliculitis and an inflamed sebaceous cyst in the past.  Past Medical History:  Diagnosis Date  . Cancer ([place]) 1981   enfometrial or cervical?  . Hyperlipidemia   . Hypertension     Past Surgical History:  Procedure Laterality Date  . LAPAROSCOPIC CHOLECYSTECTOMY  2004  . TOTAL ABDOMINAL HYSTERECTOMY  1981   w/ BSO for cancerous reasons    Family History  Problem Relation Age of Onset  . Other Mother 56       CHF  . Lung cancer Father        83  . Cancer Sister        liver  . Breast cancer Sister   . Lung cancer Brother        exposed to agent orange.   . Hyperlipidemia Sister   . Cancer Sister        stomach  . Hypertension Daughter 64  . Hyperlipidemia Maternal Uncle     Social History   Socioeconomic History  . Marital status: Married    Spouse name: Gwyndolyn Saxon  . Number of children: 2  . Years of education: 61  . Highest education level: High school graduate  Occupational History  . Occupation: Environmental consultant living    Comment: housekeeping  Tobacco Use  . Smoking status: Never Smoker  . Smokeless tobacco: Never Used  Vaping Use  . Vaping Use: Never used   Substance and Sexual Activity  . Alcohol use: No  . Drug use: No  . Sexual activity: Yes  Other Topics Concern  . Not on file  Social History Narrative   Works at assisted living part time   Social Determinants of Radio broadcast assistant Strain: Not on file  Food Insecurity: Not on file  Transportation Needs: Not on file  Physical Activity: Not on file  Stress: Not on file  Social Connections: Not on file  Intimate Partner Violence: Not on file    Outpatient Medications Prior to Visit  Medication Sig Dispense Refill  . amoxicillin-clavulanate (AUGMENTIN) 875-125 MG tablet Take 1 tablet by mouth 2 (two) times daily. 6 tablet 0  . benazepril (LOTENSIN) 40 MG tablet TAKE 1 TABLET BY MOUTH EVERY DAY 90 tablet 0  . betamethasone valerate (VALISONE) 0.1 % cream Apply topically at bedtime as needed. 45 g 0  . Calcium Citrate-Vitamin D (CITRACAL + D PO) Take by mouth daily.     . nitrofurantoin, macrocrystal-monohydrate, (MACROBID) 100 MG capsule Take 1 capsule (100  mg total) by mouth 2 (two) times daily. 10 capsule 0  . rosuvastatin (CRESTOR) 10 MG tablet Take 1 tablet (10 mg total) by mouth daily. 90 tablet 3  . vitamin B-12 (CYANOCOBALAMIN) 500 MCG tablet Take 500 mcg by mouth every other day.     No facility-administered medications prior to visit.    Allergies  Allergen Reactions  . Bactrim [Sulfamethoxazole-Trimethoprim] Other (See Comments)    Thrush  . Codeine   . Latex   . Macrobid [Nitrofurantoin] Other (See Comments)    Chest Pain  . Zoloft [Sertraline Hcl] Other (See Comments)    Chest pain    ROS Review of Systems    Objective:    Physical Exam Vitals reviewed.  Constitutional:      Appearance: She is well-developed and well-nourished.  HENT:     Head: Normocephalic and atraumatic.  Eyes:     Extraocular Movements: EOM normal.     Conjunctiva/sclera: Conjunctivae normal.  Cardiovascular:     Rate and Rhythm: Normal rate.  Pulmonary:      Effort: Pulmonary effort is normal.  Skin:    General: Skin is dry.     Coloration: Skin is not pale.     Comments: On the top of the right foot she has an approximately 4 to 5 cm area that looks dry scaly and mildly erythematous.  She has some of the scale going in between her toes as well on that right foot.  And a little bit of skin peeling at the base of the toes on the bottom of the foot.  In the left groin crease she has a somewhat purple/erythematous papule most consistent with a sebaceous cyst versus inflamed follicle.  Neurological:     Mental Status: She is alert and oriented to person, place, and time.  Psychiatric:        Mood and Affect: Mood and affect normal.        Behavior: Behavior normal.     BP 132/78   Pulse 83   Ht 5\' 4"  (1.626 m)   Wt 163 lb (73.9 kg)   SpO2 98%   BMI 27.98 kg/m  Wt Readings from Last 3 Encounters:  09/30/20 163 lb (73.9 kg)  08/19/20 164 lb 6.4 oz (74.6 kg)  02/22/20 159 lb (72.1 kg)     There are no preventive care reminders to display for this patient.  There are no preventive care reminders to display for this patient.  Lab Results  Component Value Date   TSH 1.53 02/22/2020   Lab Results  Component Value Date   WBC 4.9 02/22/2020   HGB 13.9 02/22/2020   HCT 41.7 02/22/2020   MCV 92.7 02/22/2020   PLT 243 02/22/2020   Lab Results  Component Value Date   NA 141 05/20/2020   K 5.0 05/20/2020   CO2 27 05/20/2020   GLUCOSE 84 05/20/2020   BUN 12 05/20/2020   CREATININE 0.54 (L) 05/20/2020   BILITOT 0.5 02/22/2020   ALKPHOS 55 03/12/2016   AST 19 02/22/2020   ALT 16 02/22/2020   PROT 7.0 02/22/2020   ALBUMIN 4.1 03/12/2016   CALCIUM 9.9 05/20/2020   Lab Results  Component Value Date   CHOL 161 05/20/2020   Lab Results  Component Value Date   HDL 51 05/20/2020   Lab Results  Component Value Date   LDLCALC 92 05/20/2020   Lab Results  Component Value Date   TRIG 88 05/20/2020   Lab Results  Component  Value Date   CHOLHDL 3.2 05/20/2020   Lab Results  Component Value Date   HGBA1C 5.3 03/12/2016      Assessment & Plan:   Problem List Items Addressed This Visit   None   Visit Diagnoses    Rash of foot    -  Primary   Relevant Medications   terbinafine (LAMISIL) 1 % cream   Other Relevant Orders   Fungal stain   Inflamed sebaceous cyst       Relevant Medications   mupirocin ointment (BACTROBAN) 2 %      Rash-skin scraping performed.  Most likely fungal.  We will treat with terbinafine topical 1% cream.  Recommend treatment for 1 month.  Sebaceous cyst versus inflamed follicle.  Recommend warm compresses also give her some mupirocin ointment.  It actually looks like it may have already drained and looks like it may actually be in the healing phase but encouraged her to keep an eye on it if it is getting worse please let us know.  Meds ordered this encounter  Medications  . mupirocin ointment (BACTROBAN) 2 %    Sig: Apply topically 2 (two) times daily as needed. For inflamed cyst/follicle    Dispense:  30 g    Refill:  0  . terbinafine (LAMISIL) 1 % cream    Sig: Apply 1 application topically 2 (two) times daily. X 4 week for rash on foot    Dispense:  30 g    Refill:  0    Follow-up: No follow-ups on file.    Beatrice Lecher, MD

## 2020-10-02 LAB — FUNGAL STAIN
MICRO NUMBER:: 11533964
SPECIMEN QUALITY:: ADEQUATE

## 2020-11-11 ENCOUNTER — Ambulatory Visit: Payer: Medicare HMO | Admitting: Family Medicine

## 2020-12-05 ENCOUNTER — Ambulatory Visit (INDEPENDENT_AMBULATORY_CARE_PROVIDER_SITE_OTHER): Payer: Medicare HMO | Admitting: Family Medicine

## 2020-12-05 ENCOUNTER — Encounter: Payer: Self-pay | Admitting: Family Medicine

## 2020-12-05 VITALS — BP 122/66 | HR 71 | Ht 64.0 in | Wt 163.0 lb

## 2020-12-05 DIAGNOSIS — M79652 Pain in left thigh: Secondary | ICD-10-CM

## 2020-12-05 DIAGNOSIS — I10 Essential (primary) hypertension: Secondary | ICD-10-CM | POA: Diagnosis not present

## 2020-12-05 DIAGNOSIS — E538 Deficiency of other specified B group vitamins: Secondary | ICD-10-CM | POA: Diagnosis not present

## 2020-12-05 DIAGNOSIS — R140 Abdominal distension (gaseous): Secondary | ICD-10-CM | POA: Diagnosis not present

## 2020-12-05 DIAGNOSIS — Z23 Encounter for immunization: Secondary | ICD-10-CM

## 2020-12-05 DIAGNOSIS — E7800 Pure hypercholesterolemia, unspecified: Secondary | ICD-10-CM

## 2020-12-05 MED ORDER — ZOSTER VAC RECOMB ADJUVANTED 50 MCG/0.5ML IM SUSR: 0.5000 mL | Freq: Once | INTRAMUSCULAR | 0 refills | Status: AC

## 2020-12-05 MED ORDER — BENAZEPRIL-HYDROCHLOROTHIAZIDE 10-12.5 MG PO TABS: 1.0000 | ORAL_TABLET | Freq: Every day | ORAL | 1 refills | Status: DC

## 2020-12-05 MED ORDER — TETANUS-DIPHTH-ACELL PERTUSSIS 5-2.5-18.5 LF-MCG/0.5 IM SUSP: 0.5000 mL | Freq: Once | INTRAMUSCULAR | 0 refills | Status: AC

## 2020-12-05 NOTE — Assessment & Plan Note (Signed)
Pressure overall looks good but she is just feeling a little tired on the benazepril 40 mg so we will switch to benazepril HCTZ and decrease the dose of the benazepril.  Did discuss potential side effects.  Plan to follow-up in 2 to 3 months for blood pressure.

## 2020-12-05 NOTE — Progress Notes (Signed)
Established Patient Office Visit  Subjective:  Patient ID: Michelle Lawrence, female    DOB: 1946-05-30  Age: 75 y.o. MRN: [id number]  CC:  Chief Complaint  Patient presents with  . Hypertension    Discuss cutting or mg dosage of  her bp medication     HPI Michelle Lawrence presents for   Hypertension- Pt denies chest pain, SOB, dizziness, or heart palpitations.  Taking meds as directed w/o problems.  Denies medication side effects.    Complains of bloating.  She has noticed that it occurs after eating certain foods particularly if she goes out to eat does not happen at home often at home.  It usually eventually gets better on its own.  She has not been able to necessarily pinpoint specific foods.  Bowels are moving normally otherwise.  Follow-up hyperlipidemia-has been tolerating the Crestor well and says that she has been trying to eat healthy to help reduce her cholesterol levels.  Lately she has been experiencing some pain in the left buttock area radiating down the outer left leg to maybe about mid thigh area.  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  Sometimes she will notice it at night as well.  She wants to know if she could maybe try a muscle relaxer.  Past Medical History:  Diagnosis Date  . Cancer ([place]) 1981   enfometrial or cervical?  . Hyperlipidemia   . Hypertension     Past Surgical History:  Procedure Laterality Date  . LAPAROSCOPIC CHOLECYSTECTOMY  2004  . TOTAL ABDOMINAL HYSTERECTOMY  1981   w/ BSO for cancerous reasons    Family History  Problem Relation Age of Onset  . Other Mother 44       CHF  . Lung cancer Father        57  . Cancer Sister        liver  . Breast cancer Sister   . Lung cancer  Brother        exposed to agent orange.   . Hyperlipidemia Sister   . Cancer Sister        stomach  . Hypertension Daughter 66  . Hyperlipidemia Maternal Uncle     Social History   Socioeconomic History  . Marital status: Married    Spouse name: Gwyndolyn Saxon  . Number of children: 2  . Years of education: 42  . Highest education level: High school graduate  Occupational History  . Occupation: Environmental consultant living    Comment: housekeeping  Tobacco Use  . Smoking status: Never Smoker  . Smokeless tobacco: Never Used  Vaping Use  . Vaping Use: Never used  Substance and Sexual Activity  . Alcohol use: No  . Drug use: No  . Sexual activity: Yes  Other Topics Concern  . Not on file  Social History Narrative   Works at assisted living part time   Social Determinants of Radio broadcast assistant Strain: Not on file  Food Insecurity: Not on file  Transportation Needs: Not on file  Physical Activity: Not on file  Stress: Not on file  Social Connections: Not on file  Intimate Partner Violence: Not on file  Outpatient Medications Prior to Visit  Medication Sig Dispense Refill  . betamethasone valerate (VALISONE) 0.1 % cream Apply topically at bedtime as needed. 45 g 0  . Calcium Citrate-Vitamin D (CITRACAL + D PO) Take by mouth daily.     . rosuvastatin (CRESTOR) 10 MG tablet Take 1 tablet (10 mg total) by mouth daily. 90 tablet 3  . terbinafine (LAMISIL) 1 % cream Apply 1 application topically 2 (two) times daily. X 4 week for rash on foot 30 g 0  . vitamin B-12 (CYANOCOBALAMIN) 500 MCG tablet Take 500 mcg by mouth every other day.    Marland Kitchen amoxicillin-clavulanate (AUGMENTIN) 875-125 MG tablet Take 1 tablet by mouth 2 (two) times daily. 6 tablet 0  . benazepril (LOTENSIN) 40 MG tablet TAKE 1 TABLET BY MOUTH EVERY DAY 90 tablet 0  . mupirocin ointment (BACTROBAN) 2 % Apply topically 2 (two) times daily as needed. For inflamed cyst/follicle 30 g 0  . nitrofurantoin,  macrocrystal-monohydrate, (MACROBID) 100 MG capsule Take 1 capsule (100 mg total) by mouth 2 (two) times daily. 10 capsule 0   No facility-administered medications prior to visit.    Allergies  Allergen Reactions  . Bactrim [Sulfamethoxazole-Trimethoprim] Other (See Comments)    Thrush  . Codeine   . Latex   . Macrobid [Nitrofurantoin] Other (See Comments)    Chest Pain  . Zoloft [Sertraline Hcl] Other (See Comments)    Chest pain    ROS Review of Systems    Objective:    Physical Exam Constitutional:      Appearance: She is well-developed.  HENT:     Head: Normocephalic and atraumatic.  Cardiovascular:     Rate and Rhythm: Normal rate and regular rhythm.     Heart sounds: Normal heart sounds.  Pulmonary:     Effort: Pulmonary effort is normal.     Breath sounds: Normal breath sounds.  Musculoskeletal:     Comments: Allie tender over the left low back but nontender over the lumbar spine or SI joint.  Negative straight leg raise bilaterally hip, knee and ankle strength is 5 out of 5.  Skin:    General: Skin is warm and dry.  Neurological:     Mental Status: She is alert and oriented to person, place, and time.  Psychiatric:        Behavior: Behavior normal.     BP 122/66   Pulse 71   Ht 5\' 4"  (1.626 m)   Wt 163 lb (73.9 kg)   SpO2 98%   BMI 27.98 kg/m  Wt Readings from Last 3 Encounters:  12/05/20 163 lb (73.9 kg)  09/30/20 163 lb (73.9 kg)  08/19/20 164 lb 6.4 oz (74.6 kg)     There are no preventive care reminders to display for this patient.  There are no preventive care reminders to display for this patient.  Lab Results  Component Value Date   TSH 1.53 02/22/2020   Lab Results  Component Value Date   WBC 4.9 02/22/2020   HGB 13.9 02/22/2020   HCT 41.7 02/22/2020   MCV 92.7 02/22/2020   PLT 243 02/22/2020   Lab Results  Component Value Date   NA 141 05/20/2020   K 5.0 05/20/2020   CO2 27 05/20/2020   GLUCOSE 84 05/20/2020   BUN 12  05/20/2020   CREATININE 0.54 (L) 05/20/2020   BILITOT 0.5 02/22/2020   ALKPHOS 55 03/12/2016   AST 19 02/22/2020   ALT 16 02/22/2020   PROT 7.0  02/22/2020   ALBUMIN 4.1 03/12/2016   CALCIUM 9.9 05/20/2020   Lab Results  Component Value Date   CHOL 161 05/20/2020   Lab Results  Component Value Date   HDL 51 05/20/2020   Lab Results  Component Value Date   LDLCALC 92 05/20/2020   Lab Results  Component Value Date   TRIG 88 05/20/2020   Lab Results  Component Value Date   CHOLHDL 3.2 05/20/2020   Lab Results  Component Value Date   HGBA1C 5.3 03/12/2016      Assessment & Plan:   Problem List Items Addressed This Visit      Cardiovascular and Mediastinum   ESSENTIAL HYPERTENSION, BENIGN - Primary    Pressure overall looks good but she is just feeling a little tired on the benazepril 40 mg so we will switch to benazepril HCTZ and decrease the dose of the benazepril.  Did discuss potential side effects.  Plan to follow-up in 2 to 3 months for blood pressure.      Relevant Medications   benazepril-hydrochlorthiazide (LOTENSIN HCT) 10-12.5 MG tablet   Other Relevant Orders   COMPLETE METABOLIC PANEL WITH GFR     Other   Hyperlipemia    Continue with daily statin.      Relevant Medications   benazepril-hydrochlorthiazide (LOTENSIN HCT) 10-12.5 MG tablet    Other Visit Diagnoses    Need for Tdap vaccination       Relevant Medications   Tdap (BOOSTRIX) 5-2.5-18.5 LF-MCG/0.5 injection   Need for Zostavax administration       Relevant Medications   Zoster Vaccine Adjuvanted [facility]) injection   B12 deficiency       Relevant Orders   B12   Bloating       Left thigh pain           Left thigh pain most consistent with sciatica.  This she had a negative straight leg raise on exam today.  Given handout with exercises to do on her own at home.  She has a little bit of tizanidine at home so she wants to try it at night that is perfectly fine but I did warn  about potential for sedation and not to take it and then drive or try to work.  If its not improving or getting worse then please let me know and we can work it up a little bit further.  He has a history of low B12 and has been taking it for quite some time and just wants to make sure that she is not taking too much of it so plan to recheck.  Bloating-it sounds like it is very specific to certain foods.  Often eating out the food is much more greasy and rich.  Encouraged her to keep a food journal to see if she can see if there are any patterns to specific foods if she feels like is getting worse or more pronounced or notices blood in the stool then please let us know.  Meds ordered this encounter  Medications  . Tdap (BOOSTRIX) 5-2.5-18.5 LF-MCG/0.5 injection    Sig: Inject 0.5 mLs into the muscle once for 1 dose.    Dispense:  0.5 mL    Refill:  0  . Zoster Vaccine Adjuvanted [facility]) injection    Sig: Inject 0.5 mLs into the muscle once for 1 dose. Second immunization to be done 2-6 months after the first    Dispense:  0.5 mL    Refill:  0  .  benazepril-hydrochlorthiazide (LOTENSIN HCT) 10-12.5 MG tablet    Sig: Take 1 tablet by mouth daily.    Dispense:  90 tablet    Refill:  1    Follow-up: Return in about 2 months (around 02/04/2021) for Hypertension/change in BP med.    Beatrice Lecher, MD

## 2020-12-05 NOTE — Assessment & Plan Note (Signed)
Continue with daily statin.

## 2020-12-09 DIAGNOSIS — I10 Essential (primary) hypertension: Secondary | ICD-10-CM | POA: Diagnosis not present

## 2020-12-09 DIAGNOSIS — E538 Deficiency of other specified B group vitamins: Secondary | ICD-10-CM | POA: Diagnosis not present

## 2020-12-09 LAB — COMPLETE METABOLIC PANEL WITH GFR
AG Ratio: 1.7 (calc) (ref 1.0–2.5)
ALT: 17 U/L (ref 6–29)
AST: 16 U/L (ref 10–35)
Albumin: 4.3 g/dL (ref 3.6–5.1)
Alkaline phosphatase (APISO): 54 U/L (ref 37–153)
BUN/Creatinine Ratio: 21 (calc) (ref 6–22)
BUN: 12 mg/dL (ref 7–25)
CO2: 30 mmol/L (ref 20–32)
Calcium: 9.7 mg/dL (ref 8.6–10.4)
Chloride: 104 mmol/L (ref 98–110)
Creat: 0.58 mg/dL — ABNORMAL LOW (ref 0.60–0.93)
GFR, Est African American: 104 mL/min/{1.73_m2} (ref >=60)
GFR, Est Non African American: 90 mL/min/{1.73_m2} (ref >=60)
Globulin: 2.5 g/dL (calc) (ref 1.9–3.7)
Glucose, Bld: 81 mg/dL (ref 65–99)
Potassium: 4.7 mmol/L (ref 3.5–5.3)
Sodium: 141 mmol/L (ref 135–146)
Total Bilirubin: 0.5 mg/dL (ref 0.2–1.2)
Total Protein: 6.8 g/dL (ref 6.1–8.1)

## 2020-12-09 LAB — VITAMIN B12: Vitamin B-12: 600 pg/mL (ref 200–1100)

## 2020-12-23 ENCOUNTER — Other Ambulatory Visit: Payer: Self-pay | Admitting: Family Medicine

## 2020-12-23 DIAGNOSIS — I10 Essential (primary) hypertension: Secondary | ICD-10-CM

## 2020-12-27 ENCOUNTER — Telehealth: Payer: Self-pay | Admitting: *Deleted

## 2020-12-27 NOTE — Telephone Encounter (Signed)
Spoke w/pt she informed me that she tested positive for covid. She asked what she could take.   Pt was advised to take OTC medications for sxs and stay hydrated, disinfecting surfaces, masking, and distancing

## 2021-02-13 ENCOUNTER — Encounter: Payer: Self-pay | Admitting: Family Medicine

## 2021-02-13 ENCOUNTER — Telehealth (INDEPENDENT_AMBULATORY_CARE_PROVIDER_SITE_OTHER): Payer: Medicare HMO | Admitting: Family Medicine

## 2021-02-13 ENCOUNTER — Ambulatory Visit: Payer: Medicare HMO | Admitting: Family Medicine

## 2021-02-13 VITALS — BP 118/68

## 2021-02-13 DIAGNOSIS — I10 Essential (primary) hypertension: Secondary | ICD-10-CM

## 2021-02-13 NOTE — Progress Notes (Signed)
Virtual Visit via Telephone Note  I connected with Michelle Lawrence on 02/13/21 at  9:10 AM EDT by telephone and verified that I am speaking with the correct person using two identifiers.   I discussed the limitations, risks, security and privacy concerns of performing an evaluation and management service by telephone and the availability of in person appointments. I also discussed with the patient that there may be a patient responsible charge related to this service. The patient expressed understanding and agreed to proceed.  Patient location: at home Provider loccation: In office   Subjective:    CC: BP ck  HPI: F/U HTN - we changed her BP pill to benaz HCTZ as she was feeling more tired on the benazepril 40mg .     Past medical history, Surgical history, Family history not pertinant except as noted below, Social history, Allergies, and medications have been entered into the medical record, reviewed, and corrections made.   Review of Systems: No fevers, chills, night sweats, weight loss, chest pain, or shortness of breath.   Objective:    General: Speaking clearly in complete sentences without any shortness of breath.  Alert and oriented x3.  Normal judgment. No apparent acute distress.    Impression and Recommendations:   ESSENTIAL HYPERTENSION, BENIGN BP  Is great today. Due for BMP.  F/U in 6 mo.  She feels good on the pill  Left leg pain - says it comes and goes. Says it is not much better but not worse. WE discussed we can work up further if the exercises are not helping.    Reminder to get her Tdap shot at the pharmacy.  Says it ws going to pay $45 and so held off.     I discussed the assessment and treatment plan with the patient. The patient was provided an opportunity to ask questions and all were answered. The patient agreed with the plan and demonstrated an understanding of the instructions.   The patient was advised to call back or seek an in-person evaluation if  the symptoms worsen or if the condition fails to improve as anticipated.  I provided 15 minutes of non-face-to-face time during this encounter.   Beatrice Lecher, MD

## 2021-02-13 NOTE — Assessment & Plan Note (Signed)
BP  Is great today. Due for BMP.  F/U in 6 mo.  She feels good on the pill

## 2021-02-27 DIAGNOSIS — I10 Essential (primary) hypertension: Secondary | ICD-10-CM | POA: Diagnosis not present

## 2021-02-28 ENCOUNTER — Other Ambulatory Visit: Payer: Self-pay | Admitting: Family Medicine

## 2021-02-28 DIAGNOSIS — I10 Essential (primary) hypertension: Secondary | ICD-10-CM

## 2021-02-28 LAB — BASIC METABOLIC PANEL WITH GFR
BUN: 13 mg/dL (ref 7–25)
CO2: 26 mmol/L (ref 20–32)
Calcium: 9.5 mg/dL (ref 8.6–10.4)
Chloride: 105 mmol/L (ref 98–110)
Creat: 0.62 mg/dL (ref 0.60–1.00)
Glucose, Bld: 109 mg/dL — ABNORMAL HIGH (ref 65–99)
Potassium: 3.9 mmol/L (ref 3.5–5.3)
Sodium: 139 mmol/L (ref 135–146)
eGFR: 93 mL/min/{1.73_m2} (ref >=60)

## 2021-02-28 NOTE — Progress Notes (Signed)
All labs are normal. 

## 2021-03-03 ENCOUNTER — Other Ambulatory Visit: Payer: Self-pay | Admitting: *Deleted

## 2021-03-20 ENCOUNTER — Telehealth: Payer: Self-pay

## 2021-03-20 NOTE — Telephone Encounter (Signed)
Pt called wanting to know if it is okay for her to take Tylenol PM as needed.  Per Dr. Madilyn Fireman, LVM informing pt that this is ok.  Charyl Bigger, CMA

## 2021-04-10 ENCOUNTER — Telehealth: Payer: Self-pay

## 2021-04-10 NOTE — Telephone Encounter (Signed)
Pt states she got a COVID booster on 08/29/2020 and a few weeks later tested positive for COVID. She is wanting to know if Dr. Madilyn Fireman thinks she needs to get the second booster. She said she is honestly thinking about not getting it but wants to know what Dr. Madilyn Fireman says first.

## 2021-04-11 NOTE — Telephone Encounter (Signed)
Okay to wait 4 months since she just had COVID to get her next booster.  Please update immunization tab

## 2021-04-11 NOTE — Telephone Encounter (Signed)
LVM informing pt of COVID booster recommendation from Dr. Madilyn Fireman and to return call if she had any questions.  Charyl Bigger, CMA

## 2021-05-01 ENCOUNTER — Ambulatory Visit (INDEPENDENT_AMBULATORY_CARE_PROVIDER_SITE_OTHER): Payer: Medicare HMO | Admitting: Family Medicine

## 2021-05-01 ENCOUNTER — Other Ambulatory Visit: Payer: Self-pay

## 2021-05-01 ENCOUNTER — Encounter: Payer: Self-pay | Admitting: Family Medicine

## 2021-05-01 VITALS — Temp 98.4°F

## 2021-05-01 DIAGNOSIS — Z23 Encounter for immunization: Secondary | ICD-10-CM | POA: Diagnosis not present

## 2021-05-01 NOTE — Progress Notes (Signed)
Pt here for flu shot. Afebrile,no recent illness. Vaccination given, pt tolerated well.  

## 2021-05-01 NOTE — Addendum Note (Signed)
Addended by: Teddy Spike on: 05/01/2021 03:35 PM   Modules accepted: Orders, Level of Service

## 2021-05-16 ENCOUNTER — Other Ambulatory Visit: Payer: Self-pay | Admitting: Family Medicine

## 2021-05-16 DIAGNOSIS — Z1231 Encounter for screening mammogram for malignant neoplasm of breast: Secondary | ICD-10-CM

## 2021-05-28 ENCOUNTER — Other Ambulatory Visit: Payer: Self-pay | Admitting: Family Medicine

## 2021-05-28 DIAGNOSIS — E7800 Pure hypercholesterolemia, unspecified: Secondary | ICD-10-CM

## 2021-05-28 DIAGNOSIS — I10 Essential (primary) hypertension: Secondary | ICD-10-CM

## 2021-06-12 ENCOUNTER — Ambulatory Visit (INDEPENDENT_AMBULATORY_CARE_PROVIDER_SITE_OTHER): Payer: Medicare HMO

## 2021-06-12 ENCOUNTER — Other Ambulatory Visit: Payer: Self-pay

## 2021-06-12 DIAGNOSIS — Z1231 Encounter for screening mammogram for malignant neoplasm of breast: Secondary | ICD-10-CM

## 2021-06-17 NOTE — Progress Notes (Signed)
Please call patient. Normal mammogram.  Repeat in 1 year.  

## 2021-07-21 ENCOUNTER — Ambulatory Visit (INDEPENDENT_AMBULATORY_CARE_PROVIDER_SITE_OTHER): Payer: Medicare HMO | Admitting: Family Medicine

## 2021-07-21 ENCOUNTER — Other Ambulatory Visit: Payer: Self-pay

## 2021-07-21 VITALS — BP 126/62 | HR 67 | Temp 98.4°F | Resp 16 | Ht 64.0 in | Wt 163.0 lb

## 2021-07-21 DIAGNOSIS — R739 Hyperglycemia, unspecified: Secondary | ICD-10-CM | POA: Diagnosis not present

## 2021-07-21 DIAGNOSIS — R531 Weakness: Secondary | ICD-10-CM | POA: Diagnosis not present

## 2021-07-21 DIAGNOSIS — F4321 Adjustment disorder with depressed mood: Secondary | ICD-10-CM | POA: Diagnosis not present

## 2021-07-21 DIAGNOSIS — I10 Essential (primary) hypertension: Secondary | ICD-10-CM

## 2021-07-21 DIAGNOSIS — E7800 Pure hypercholesterolemia, unspecified: Secondary | ICD-10-CM

## 2021-07-21 NOTE — Assessment & Plan Note (Signed)
Due for updated lipids.

## 2021-07-21 NOTE — Progress Notes (Signed)
Established Patient Office Visit  Subjective:  Patient ID: Michelle Lawrence, female    DOB: Apr 14, 1946  Age: 75 y.o. MRN: [id number]  CC:  Chief Complaint  Patient presents with   Hypertension    Follow up     HPI Michelle Lawrence presents for   Hypertension- Pt denies chest pain, SOB, dizziness, or heart palpitations.  Taking meds as directed w/o problems.  Denies medication side effects.  She says occasionally she will have episodes where she feels a little bit weak.  She is not really sure what causes it and says its not very often.  Hyperlipidemia - tolerating stating well with no myalgias or significant side effects.  Lab Results  Component Value Date   CHOL 161 05/20/2020   HDL 51 05/20/2020   LDLCALC 92 05/20/2020   TRIG 88 05/20/2020   CHOLHDL 3.2 05/20/2020    Still takes her B12 supplement every other day and has for years.  Has been under little bit more stress recently as her daughter-in-law at age 3 just passed away from complications from her liver secondary to rheumatoid arthritis and lupus.  Past Medical History:  Diagnosis Date   Cancer ([place]) 1981   enfometrial or cervical?   Hyperlipidemia    Hypertension     Past Surgical History:  Procedure Laterality Date   LAPAROSCOPIC CHOLECYSTECTOMY  2004   TOTAL ABDOMINAL HYSTERECTOMY  1981   w/ BSO for cancerous reasons    Family History  Problem Relation Age of Onset   Other Mother 41       CHF   Lung cancer Father        95   Cancer Sister        liver   Breast cancer Sister    Lung cancer Brother        exposed to agent orange.    Hyperlipidemia Sister    Cancer Sister        stomach   Hypertension Daughter 40   Hyperlipidemia Maternal Uncle     Social History   Socioeconomic History   Marital status: Married    Spouse name: Michelle Lawrence   Number of children: 2   Years of education: 12   Highest education level: High school graduate  Occupational History   Occupation: Environmental consultant living     Comment: housekeeping  Tobacco Use   Smoking status: Never   Smokeless tobacco: Never  Vaping Use   Vaping Use: Never used  Substance and Sexual Activity   Alcohol use: No   Drug use: No   Sexual activity: Yes  Other Topics Concern   Not on file  Social History Narrative   Works at assisted living part time   Social Determinants of Radio broadcast assistant Strain: Not on file  Food Insecurity: Not on file  Transportation Needs: Not on file  Physical Activity: Not on file  Stress: Not on file  Social Connections: Not on file  Intimate Partner Violence: Not on file    Outpatient Medications Prior to Visit  Medication Sig Dispense Refill   benazepril-hydrochlorthiazide (LOTENSIN HCT) 10-12.5 MG tablet TAKE 1 TABLET BY MOUTH EVERY DAY 90 tablet 1   Calcium Citrate-Vitamin D (CITRACAL + D PO) Take by mouth daily.      rosuvastatin (CRESTOR) 10 MG tablet TAKE 1 TABLET BY MOUTH EVERY DAY 90 tablet 3   vitamin B-12 (CYANOCOBALAMIN) 500 MCG tablet Take 500 mcg by mouth every other day.  No facility-administered medications prior to visit.    Allergies  Allergen Reactions   Bactrim [Sulfamethoxazole-Trimethoprim] Other (See Comments)    Thrush   Codeine    Latex    Macrobid [Nitrofurantoin] Other (See Comments)    Chest Pain   Zoloft [Sertraline Hcl] Other (See Comments)    Chest pain    ROS Review of Systems    Objective:    Physical Exam  BP 126/62   Pulse 67   Temp 98.4 F (36.9 C)   Resp 16   Ht _0  (1.626 m)   Wt 163 lb 0.6 oz (74 kg)   BMI 27.99 kg/m  Wt Readings from Last 3 Encounters:  07/21/21 163 lb 0.6 oz (74 kg)  12/05/20 163 lb (73.9 kg)  09/30/20 163 lb (73.9 kg)     Health Maintenance Due  Topic Date Due   COLONOSCOPY (Pts 45-62yr Insurance coverage will need to be confirmed)  Never done   COLON CANCER SCREENING ANNUAL FOBT  05/28/2021    There are no preventive care reminders to display for this patient.  Lab Results   Component Value Date   TSH 1.53 02/22/2020   Lab Results  Component Value Date   WBC 4.9 02/22/2020   HGB 13.9 02/22/2020   HCT 41.7 02/22/2020   MCV 92.7 02/22/2020   PLT 243 02/22/2020   Lab Results  Component Value Date   NA 139 02/27/2021   K 3.9 02/27/2021   CO2 26 02/27/2021   GLUCOSE 109 (H) 02/27/2021   BUN 13 02/27/2021   CREATININE 0.62 02/27/2021   BILITOT 0.5 12/09/2020   ALKPHOS 55 03/12/2016   AST 16 12/09/2020   ALT 17 12/09/2020   PROT 6.8 12/09/2020   ALBUMIN 4.1 03/12/2016   CALCIUM 9.5 02/27/2021   EGFR 93 02/27/2021   Lab Results  Component Value Date   CHOL 161 05/20/2020   Lab Results  Component Value Date   HDL 51 05/20/2020   Lab Results  Component Value Date   LDLCALC 92 05/20/2020   Lab Results  Component Value Date   TRIG 88 05/20/2020   Lab Results  Component Value Date   CHOLHDL 3.2 05/20/2020   Lab Results  Component Value Date   HGBA1C 5.3 03/12/2016      Assessment & Plan:   Problem List Items Addressed This Visit       Cardiovascular and Mediastinum   ESSENTIAL HYPERTENSION, BENIGN - Primary    Well controlled. Continue current regimen. Follow up in  6 mo . Due for labs today.       Relevant Orders   Lipid Panel w/reflex Direct LDL   COMPLETE METABOLIC PANEL WITH GFR   CBC   Hemoglobin A1c     Other   Hyperlipemia    Due for updated lipids.        Relevant Orders   Lipid Panel w/reflex Direct LDL   COMPLETE METABOLIC PANEL WITH GFR   CBC   Hemoglobin A1c   Other Visit Diagnoses     Grief       Weakness           Occasional episode of weakness-she does have a home blood pressure cuff so we did discuss may be checking it when she feels that way and making sure that she is hydrating well and eating regularly she says sometimes she does skip meals because she just get so busy.  Grief -Did encourage her to reach out if  at any point she feels like she is struggling I will be happy to refer her to a  grief counselor if she would like.  No orders of the defined types were placed in this encounter.   Follow-up: Return in about 6 months (around 01/19/2022) for bp.    Beatrice Lecher, MD

## 2021-07-21 NOTE — Assessment & Plan Note (Signed)
Well controlled. Continue current regimen. Follow up in  6 mo . Due for labs today.

## 2021-07-22 LAB — CBC
HCT: 40.5 % (ref 35.0–45.0)
Hemoglobin: 13.7 g/dL (ref 11.7–15.5)
MCH: 31.2 pg (ref 27.0–33.0)
MCHC: 33.8 g/dL (ref 32.0–36.0)
MCV: 92.3 fL (ref 80.0–100.0)
MPV: 10.4 fL (ref 7.5–12.5)
Platelets: 244 10*3/uL (ref 140–400)
RBC: 4.39 10*6/uL (ref 3.80–5.10)
RDW: 11.8 % (ref 11.0–15.0)
WBC: 5.7 10*3/uL (ref 3.8–10.8)

## 2021-07-22 LAB — COMPLETE METABOLIC PANEL WITH GFR
AG Ratio: 1.7 (calc) (ref 1.0–2.5)
ALT: 18 U/L (ref 6–29)
AST: 15 U/L (ref 10–35)
Albumin: 4.3 g/dL (ref 3.6–5.1)
Alkaline phosphatase (APISO): 55 U/L (ref 37–153)
BUN/Creatinine Ratio: 27 (calc) — ABNORMAL HIGH (ref 6–22)
BUN: 15 mg/dL (ref 7–25)
CO2: 29 mmol/L (ref 20–32)
Calcium: 9.6 mg/dL (ref 8.6–10.4)
Chloride: 103 mmol/L (ref 98–110)
Creat: 0.55 mg/dL — ABNORMAL LOW (ref 0.60–1.00)
Globulin: 2.5 g/dL (calc) (ref 1.9–3.7)
Glucose, Bld: 82 mg/dL (ref 65–139)
Potassium: 4 mmol/L (ref 3.5–5.3)
Sodium: 140 mmol/L (ref 135–146)
Total Bilirubin: 0.4 mg/dL (ref 0.2–1.2)
Total Protein: 6.8 g/dL (ref 6.1–8.1)
eGFR: 96 mL/min/{1.73_m2} (ref >=60)

## 2021-07-22 LAB — LIPID PANEL W/REFLEX DIRECT LDL
Cholesterol: 163 mg/dL (ref <200)
HDL: 56 mg/dL (ref >=50)
LDL Cholesterol (Calc): 85 mg/dL (calc)
Non-HDL Cholesterol (Calc): 107 mg/dL (calc) (ref <130)
Total CHOL/HDL Ratio: 2.9 (calc) (ref <5.0)
Triglycerides: 120 mg/dL (ref <150)

## 2021-07-22 LAB — HEMOGLOBIN A1C
Hgb A1c MFr Bld: 5.4 % of total Hgb (ref <5.7)
Mean Plasma Glucose: 108 mg/dL
eAG (mmol/L): 6 mmol/L

## 2021-07-22 NOTE — Progress Notes (Signed)
Call patient: Cholesterol looks good this time.  Kidney function is stable.  Liver looks great.  Blood count is normal no sign of anemia or infection.  A1c looks good no sign of diabetes or prediabetes.

## 2021-09-15 DIAGNOSIS — H52223 Regular astigmatism, bilateral: Secondary | ICD-10-CM | POA: Diagnosis not present

## 2021-09-15 DIAGNOSIS — H524 Presbyopia: Secondary | ICD-10-CM | POA: Diagnosis not present

## 2021-09-15 DIAGNOSIS — H5203 Hypermetropia, bilateral: Secondary | ICD-10-CM | POA: Diagnosis not present

## 2021-09-15 DIAGNOSIS — H25813 Combined forms of age-related cataract, bilateral: Secondary | ICD-10-CM | POA: Diagnosis not present

## 2021-10-09 ENCOUNTER — Telehealth: Payer: Self-pay | Admitting: Family Medicine

## 2021-10-09 DIAGNOSIS — L301 Dyshidrosis [pompholyx]: Secondary | ICD-10-CM

## 2021-10-09 MED ORDER — BETAMETHASONE VALERATE 0.1 % EX CREA: TOPICAL_CREAM | Freq: Every evening | CUTANEOUS | 0 refills | Status: DC | PRN

## 2021-10-09 NOTE — Addendum Note (Signed)
Addended by: Beatrice Lecher D on: 10/09/2021 05:22 PM   Modules accepted: Orders

## 2021-10-09 NOTE — Telephone Encounter (Signed)
Meds ordered this encounter  Medications   betamethasone valerate (VALISONE) 0.1 % cream    Sig: Apply topically at bedtime as needed.    Dispense:  45 g    Refill:  0

## 2021-10-09 NOTE — Telephone Encounter (Signed)
Pt called to get a refill of betamethasoae-valerate. Its not listed as a current med.

## 2021-11-17 ENCOUNTER — Other Ambulatory Visit: Payer: Self-pay | Admitting: Family Medicine

## 2022-01-19 ENCOUNTER — Ambulatory Visit (INDEPENDENT_AMBULATORY_CARE_PROVIDER_SITE_OTHER): Payer: Medicare HMO | Admitting: Family Medicine

## 2022-01-19 ENCOUNTER — Encounter: Payer: Self-pay | Admitting: Family Medicine

## 2022-01-19 VITALS — BP 142/65 | HR 80 | Resp 16 | Ht 64.0 in | Wt 164.0 lb

## 2022-01-19 DIAGNOSIS — L918 Other hypertrophic disorders of the skin: Secondary | ICD-10-CM | POA: Diagnosis not present

## 2022-01-19 DIAGNOSIS — Z23 Encounter for immunization: Secondary | ICD-10-CM | POA: Diagnosis not present

## 2022-01-19 DIAGNOSIS — I10 Essential (primary) hypertension: Secondary | ICD-10-CM

## 2022-01-19 NOTE — Progress Notes (Signed)
   Established Patient Office Visit  Subjective   Patient ID: Michelle Lawrence, female    DOB: [birth year]  Age: 76 y.o. MRN: [id number]  Chief Complaint  Patient presents with   Hypertension    Follow up     HPI  Hypertension- Pt denies chest pain, SOB, dizziness, or heart palpitations.  Taking meds as directed w/o problems.  Denies medication side effects.    She is still working part-time.  She walks a lot at work but does not really exercise.  She also has a skin tag on her right upper eyelid that she would like removed.    ROS    Objective:     BP (!) 142/65   Pulse 80   Resp 16   Ht '5\' 4"'$  (1.626 m)   Wt 164 lb (74.4 kg)   SpO2 95%   BMI 28.15 kg/m    Physical Exam Vitals and nursing note reviewed.  Constitutional:      Appearance: She is well-developed.  HENT:     Head: Normocephalic and atraumatic.  Cardiovascular:     Rate and Rhythm: Normal rate and regular rhythm.     Heart sounds: Normal heart sounds.  Pulmonary:     Effort: Pulmonary effort is normal.     Breath sounds: Normal breath sounds.  Skin:    General: Skin is warm and dry.  Neurological:     Mental Status: She is alert and oriented to person, place, and time.  Psychiatric:        Behavior: Behavior normal.     No results found for any visits on 01/19/22.    The 10-year ASCVD risk score (Arnett DK, et al., 2019) is: 37.2%    Assessment & Plan:   Problem List Items Addressed This Visit       Cardiovascular and Mediastinum   ESSENTIAL HYPERTENSION, BENIGN - Primary    Well controlled. Continue current regimen. Follow up in  6 mo        Relevant Orders   BASIC METABOLIC PANEL WITH GFR   Other Visit Diagnoses     Skin tag           Skin tag of right upper eyelid-we discussed scheduling an appointment for removal here in our office.  She could also consider seeing an eye doctor if needed.  If she has any problems please let me know.  It is getting a little larger over  time.  Return in about 6 months (around 07/21/2022) for wellness Exam .    Beatrice Lecher, MD

## 2022-01-19 NOTE — Assessment & Plan Note (Signed)
Well controlled. Continue current regimen. Follow up in  6 mo  

## 2022-01-19 NOTE — Addendum Note (Signed)
Addended by: Izora Gala on: 01/19/2022 10:28 AM   Modules accepted: Orders

## 2022-01-20 DIAGNOSIS — I10 Essential (primary) hypertension: Secondary | ICD-10-CM | POA: Diagnosis not present

## 2022-01-21 LAB — BASIC METABOLIC PANEL WITH GFR
BUN/Creatinine Ratio: 23 (calc) — ABNORMAL HIGH (ref 6–22)
BUN: 11 mg/dL (ref 7–25)
CO2: 30 mmol/L (ref 20–32)
Calcium: 10.3 mg/dL (ref 8.6–10.4)
Chloride: 103 mmol/L (ref 98–110)
Creat: 0.48 mg/dL — ABNORMAL LOW (ref 0.60–1.00)
Glucose, Bld: 84 mg/dL (ref 65–99)
Potassium: 4 mmol/L (ref 3.5–5.3)
Sodium: 141 mmol/L (ref 135–146)
eGFR: 98 mL/min/{1.73_m2} (ref >=60)

## 2022-01-21 NOTE — Progress Notes (Signed)
Your lab work is within acceptable range and there are no concerning findings.   ?

## 2022-02-27 ENCOUNTER — Ambulatory Visit (INDEPENDENT_AMBULATORY_CARE_PROVIDER_SITE_OTHER): Payer: Medicare HMO | Admitting: Family Medicine

## 2022-02-27 VITALS — BP 136/68 | HR 94 | Wt 161.0 lb

## 2022-02-27 DIAGNOSIS — L918 Other hypertrophic disorders of the skin: Secondary | ICD-10-CM | POA: Diagnosis not present

## 2022-02-27 NOTE — Progress Notes (Signed)
   Established Patient Office Visit  Subjective   Patient ID: Michelle Lawrence, female    DOB: [birth year]  Age: 76 y.o. MRN: [id number]  Chief Complaint  Patient presents with   Skin Tag    L eye lid    HPI  Here for skin tag removal on right eyelid.  Been getting larger over time.     ROS    Objective:     BP 136/68   Pulse 94   Wt 161 lb (73 kg)   SpO2 97%   BMI 27.64 kg/m     Physical Exam  She has an approximately 8 mm pedunculated skin tag on the right upper eyelid.  No results found for any visits on 02/27/22.    The 10-year ASCVD risk score (Arnett DK, et al., 2019) is: 34.7%    Assessment & Plan:   Problem List Items Addressed This Visit   None Visit Diagnoses     Skin tag    -  Primary      Skin Tag Removal Procedure Note Diagnosis: normal and    skin tags Location:  right upper eyelid Informed Consent: Discussed risks (permanent scarring, infection, pain, bleeding, bruising, redness, and recurrence of the lesion) and benefits of the procedure, as well as the alternatives. She is aware that skin tags are benign lesions, and their removal is often not considered medically necessary. Informed consent was obtained. Preparation: The area was prepared in a standard fashion. Anesthesia: Lidocaine 1% with epinephrine Procedure Details: Iris scissors were used to perform sharp removal. Aluminum chloride was applied for hemostasis. Ointment and bandage were applied where needed. The patient tolerated the procedure well. Total number of lesions treated: 1 Plan: The patient was instructed on post-op care. Recommend OTC analgesia as needed for pain.   No follow-ups on file.    Beatrice Lecher, MD

## 2022-05-07 ENCOUNTER — Ambulatory Visit (INDEPENDENT_AMBULATORY_CARE_PROVIDER_SITE_OTHER): Payer: Medicare HMO | Admitting: Family Medicine

## 2022-05-07 DIAGNOSIS — Z23 Encounter for immunization: Secondary | ICD-10-CM

## 2022-05-15 ENCOUNTER — Other Ambulatory Visit: Payer: Self-pay | Admitting: Family Medicine

## 2022-05-15 DIAGNOSIS — E7800 Pure hypercholesterolemia, unspecified: Secondary | ICD-10-CM

## 2022-05-19 ENCOUNTER — Other Ambulatory Visit: Payer: Self-pay | Admitting: Family Medicine

## 2022-05-19 DIAGNOSIS — Z1231 Encounter for screening mammogram for malignant neoplasm of breast: Secondary | ICD-10-CM

## 2022-05-20 ENCOUNTER — Other Ambulatory Visit: Payer: Self-pay | Admitting: Family Medicine

## 2022-06-25 ENCOUNTER — Ambulatory Visit (INDEPENDENT_AMBULATORY_CARE_PROVIDER_SITE_OTHER): Payer: Medicare HMO

## 2022-06-25 DIAGNOSIS — Z1231 Encounter for screening mammogram for malignant neoplasm of breast: Secondary | ICD-10-CM

## 2022-06-29 NOTE — Progress Notes (Signed)
Please call patient. Normal mammogram.  Repeat in 1 year.  

## 2022-08-04 ENCOUNTER — Ambulatory Visit (INDEPENDENT_AMBULATORY_CARE_PROVIDER_SITE_OTHER): Payer: Medicare HMO | Admitting: Family Medicine

## 2022-08-04 VITALS — BP 139/64 | HR 77 | Ht 64.0 in | Wt 166.0 lb

## 2022-08-04 DIAGNOSIS — E7800 Pure hypercholesterolemia, unspecified: Secondary | ICD-10-CM | POA: Diagnosis not present

## 2022-08-04 DIAGNOSIS — Z0000 Encounter for general adult medical examination without abnormal findings: Secondary | ICD-10-CM

## 2022-08-04 DIAGNOSIS — I10 Essential (primary) hypertension: Secondary | ICD-10-CM

## 2022-08-04 MED ORDER — BENAZEPRIL-HYDROCHLOROTHIAZIDE 10-12.5 MG PO TABS: 1.0000 | ORAL_TABLET | Freq: Every day | ORAL | 3 refills | Status: DC

## 2022-08-04 MED ORDER — ROSUVASTATIN CALCIUM 10 MG PO TABS: 10.0000 mg | ORAL_TABLET | Freq: Every day | ORAL | 3 refills | Status: DC

## 2022-08-04 NOTE — Progress Notes (Signed)
Complete physical exam  Patient: Michelle Lawrence   DOB: [birth year]   76 y.o. Female  MRN: [id number]  Subjective:    Chief Complaint  Patient presents with   Annual Exam    Michelle Lawrence is a 76 y.o. female who presents today for a complete physical exam. She reports consuming a general diet. The patient does not participate in regular exercise at present. She generally feels fairly well. She reports sleeping fairly well. She does not have additional problems to discuss today.    Most recent fall risk assessment:    08/04/2022    9:19 AM  [place] in the past year? 0  Number falls in past yr: 0  Injury with Fall? 0     Most recent depression screenings:    08/04/2022    9:20 AM 01/19/2022    9:11 AM  PHQ 2/9 Scores  PHQ - 2 Score 2 0  PHQ- 9 Score 6         Patient Care Team: Hali Marry, MD as PCP - General (Family Medicine)   Outpatient Medications Prior to Visit  Medication Sig   betamethasone valerate (VALISONE) 0.1 % cream Apply topically at bedtime as needed.   Calcium Citrate-Vitamin D (CITRACAL + D PO) Take by mouth daily.    vitamin B-12 (CYANOCOBALAMIN) 500 MCG tablet Take 500 mcg by mouth every other day.   [DISCONTINUED] benazepril-hydrochlorthiazide (LOTENSIN HCT) 10-12.5 MG tablet TAKE 1 TABLET BY MOUTH EVERY DAY   [DISCONTINUED] rosuvastatin (CRESTOR) 10 MG tablet TAKE 1 TABLET BY MOUTH EVERY DAY   No facility-administered medications prior to visit.    ROS        Objective:     BP 139/64   Pulse 77   Ht '5\' 4"'$  (1.626 m)   Wt 166 lb (75.3 kg)   SpO2 96%   BMI 28.49 kg/m    Physical Exam Vitals and nursing note reviewed.  Constitutional:      Appearance: She is well-developed.  HENT:     Head: Normocephalic and atraumatic.     Right Ear: Tympanic membrane, ear canal and external ear normal.     Left Ear: Tympanic membrane, ear canal and external ear normal.     Nose: Nose normal.     Mouth/Throat:      Pharynx: Oropharynx is clear.  Eyes:     Conjunctiva/sclera: Conjunctivae normal.     Pupils: Pupils are equal, round, and reactive to light.  Neck:     Thyroid: No thyromegaly.  Cardiovascular:     Rate and Rhythm: Normal rate and regular rhythm.     Heart sounds: Normal heart sounds.  Pulmonary:     Effort: Pulmonary effort is normal.     Breath sounds: Normal breath sounds. No wheezing.  Abdominal:     General: Bowel sounds are normal.     Palpations: Abdomen is soft.     Tenderness: There is no abdominal tenderness.  Musculoskeletal:     Cervical back: Neck supple.  Lymphadenopathy:     Cervical: No cervical adenopathy.  Skin:    General: Skin is warm and dry.  Neurological:     Mental Status: She is alert and oriented to person, place, and time.  Psychiatric:        Behavior: Behavior normal.      No results found for any visits on 08/04/22.     Assessment & Plan:    Routine Health Maintenance  and Physical Exam  Immunization History  Administered Date(s) Administered   Fluad Quad(high Dose 65+) 05/11/2019, 05/13/2020, 05/01/2021, 05/07/2022   Influenza Split 05/08/2011, 05/09/2012   Influenza, High Dose Seasonal PF 05/18/2016   Influenza,inj,Quad PF,6+ Mos 04/10/2013, 05/03/2014, 05/02/2015, 04/15/2017, 04/29/2018   PFIZER Comirnaty(Gray Top)Covid-19 Tri-Sucrose Vaccine 08/29/2020   PFIZER(Purple Top)SARS-COV-2 Vaccination 03/11/2020, 03/29/2020   PNEUMOCOCCAL CONJUGATE-20 01/19/2022   Pfizer Covid-19 Vaccine Bivalent Booster 59yr & up 05/26/2021   Pneumococcal Conjugate-13 05/02/2015   Pneumococcal Polysaccharide-23 05/09/2012   Tdap 02/04/2012    Health Maintenance  Topic Date Due   Medicare Annual Wellness (AWV)  09/24/2020   DTaP/Tdap/Td (2 - Td or Tdap) 02/03/2022   COVID-19 Vaccine (5 - 2023-24 season) 08/20/2022 (Originally 04/17/2022)   Zoster Vaccines- Shingrix (1 of 2) 11/03/2022 (Originally 10/27/1964)   Pneumonia Vaccine 76 Years old  Completed    INFLUENZA VACCINE  Completed   DEXA SCAN  Completed   Hepatitis C Screening  Completed   HPV VACCINES  Aged Out    Discussed health benefits of physical activity, and encouraged her to engage in regular exercise appropriate for her age and condition.  Problem List Items Addressed This Visit       Cardiovascular and Mediastinum   Essential hypertension, benign    Due for labs.        Relevant Medications   benazepril-hydrochlorthiazide (LOTENSIN HCT) 10-12.5 MG tablet   rosuvastatin (CRESTOR) 10 MG tablet   Other Relevant Orders   Lipid Panel w/reflex Direct LDL   COMPLETE METABOLIC PANEL WITH GFR   CBC     Other   Hyperlipemia   Relevant Medications   benazepril-hydrochlorthiazide (LOTENSIN HCT) 10-12.5 MG tablet   rosuvastatin (CRESTOR) 10 MG tablet   Other Relevant Orders   Lipid Panel w/reflex Direct LDL   COMPLETE METABOLIC PANEL WITH GFR   CBC   Other Visit Diagnoses     Wellness examination    -  Primary      Keep up a regular exercise program and make sure you are eating a healthy diet Try to eat 4 servings of dairy a day, or if you are lactose intolerant take a calcium with vitamin D daily.  Your vaccines are up to date.  Will come back for labs since she is not fasting.  Return in about 6 months (around 02/03/2023) for Hypertension.     CBeatrice Lecher MD

## 2022-08-04 NOTE — Assessment & Plan Note (Signed)
Due for labs

## 2022-08-13 DIAGNOSIS — I10 Essential (primary) hypertension: Secondary | ICD-10-CM | POA: Diagnosis not present

## 2022-08-13 DIAGNOSIS — E7800 Pure hypercholesterolemia, unspecified: Secondary | ICD-10-CM | POA: Diagnosis not present

## 2022-08-13 LAB — CBC
HCT: 38.3 % (ref 35.0–45.0)
Hemoglobin: 12.9 g/dL (ref 11.7–15.5)
MCH: 30.9 pg (ref 27.0–33.0)
MCHC: 33.7 g/dL (ref 32.0–36.0)
MCV: 91.6 fL (ref 80.0–100.0)
MPV: 11 fL (ref 7.5–12.5)
Platelets: 211 10*3/uL (ref 140–400)
RBC: 4.18 10*6/uL (ref 3.80–5.10)
RDW: 11.8 % (ref 11.0–15.0)
WBC: 3.7 10*3/uL — ABNORMAL LOW (ref 3.8–10.8)

## 2022-08-13 LAB — COMPLETE METABOLIC PANEL WITH GFR
AG Ratio: 1.5 (calc) (ref 1.0–2.5)
ALT: 28 U/L (ref 6–29)
AST: 23 U/L (ref 10–35)
Albumin: 4.1 g/dL (ref 3.6–5.1)
Alkaline phosphatase (APISO): 52 U/L (ref 37–153)
BUN/Creatinine Ratio: 20 (calc) (ref 6–22)
BUN: 10 mg/dL (ref 7–25)
CO2: 30 mmol/L (ref 20–32)
Calcium: 8.9 mg/dL (ref 8.6–10.4)
Chloride: 102 mmol/L (ref 98–110)
Creat: 0.51 mg/dL — ABNORMAL LOW (ref 0.60–1.00)
Globulin: 2.8 g/dL (calc) (ref 1.9–3.7)
Glucose, Bld: 92 mg/dL (ref 65–99)
Potassium: 3.7 mmol/L (ref 3.5–5.3)
Sodium: 139 mmol/L (ref 135–146)
Total Bilirubin: 0.4 mg/dL (ref 0.2–1.2)
Total Protein: 6.9 g/dL (ref 6.1–8.1)
eGFR: 97 mL/min/{1.73_m2} (ref >=60)

## 2022-08-13 LAB — LIPID PANEL W/REFLEX DIRECT LDL
Cholesterol: 141 mg/dL (ref <200)
HDL: 42 mg/dL — ABNORMAL LOW (ref >=50)
LDL Cholesterol (Calc): 82 mg/dL (calc)
Non-HDL Cholesterol (Calc): 99 mg/dL (calc) (ref <130)
Total CHOL/HDL Ratio: 3.4 (calc) (ref <5.0)
Triglycerides: 83 mg/dL (ref <150)

## 2022-08-14 NOTE — Progress Notes (Signed)
Call patient: The good cholesterol has dropped a little bit so just make sure staying active and getting regular exercise.  The cholesterol numbers look pretty good.  Kidney function is stable.  White blood cell line is just a little bit borderline low but nothing worrisome at this point.  Encouraged her to get updated tetanus shot at the local pharmacy.

## 2022-12-10 ENCOUNTER — Telehealth: Payer: Self-pay | Admitting: Family Medicine

## 2022-12-10 NOTE — Telephone Encounter (Signed)
Called patient to schedule Medicare Annual Wellness Visit (AWV). Left message for patient to call back and schedule Medicare Annual Wellness Visit (AWV).  Last date of AWV: 2021  Please schedule an appointment at any time with [facility].  If any questions, please contact me at [phone].  Thank you ,  Cira Servant Patient Access Advocate II Direct Dial: [phone]

## 2023-01-22 ENCOUNTER — Ambulatory Visit
Admission: EM | Admit: 2023-01-22 | Discharge: 2023-01-22 | Disposition: A | Discharge status: Home / Self Care | Payer: Medicare HMO | Attending: Family Medicine | Admitting: Family Medicine

## 2023-01-22 DIAGNOSIS — R300 Dysuria: Secondary | ICD-10-CM | POA: Diagnosis not present

## 2023-01-22 DIAGNOSIS — N3001 Acute cystitis with hematuria: Secondary | ICD-10-CM | POA: Insufficient documentation

## 2023-01-22 LAB — POCT URINALYSIS DIP (MANUAL ENTRY)
Bilirubin, UA: NEGATIVE
Glucose, UA: NEGATIVE mg/dL
Ketones, POC UA: NEGATIVE mg/dL
Nitrite, UA: NEGATIVE
Protein Ur, POC: NEGATIVE mg/dL
Spec Grav, UA: 1.01 (ref 1.010–1.025)
Urobilinogen, UA: 0.2 E.U./dL
pH, UA: 6 (ref 5.0–8.0)

## 2023-01-22 MED ORDER — CEPHALEXIN 500 MG PO CAPS: 1000.0000 mg | ORAL_CAPSULE | Freq: Two times a day (BID) | ORAL | 0 refills | Status: AC

## 2023-01-22 NOTE — ED Provider Notes (Signed)
[facility]    CSN: [id number] Arrival date & time: 01/22/23  1233      History   Chief Complaint Chief Complaint  Patient presents with   Dysuria    HPI Michelle Lawrence is a 77 y.o. female.   HPI Pleasant 77 year old female presents with dysuria for 2 days.  PMH significant for palpitations, HTN, and cancer.   Past Medical History:  Diagnosis Date   Cancer (HCC) 1981   enfometrial or cervical?   Hyperlipidemia    Hypertension     Patient Active Problem List   Diagnosis Date Noted   Palpitations 02/22/2020   Dyshidrotic hand dermatitis 11/09/2019   Nocturia 11/09/2019   MRSA (methicillin resistant staph aureus) culture positive 08/28/2013   Osteopenia 12/17/2010   UNSPECIFIED VITAMIN D DEFICIENCY 10/24/2010   Hyperlipemia 10/24/2010   Essential hypertension, benign 10/24/2010   EDEMA 10/24/2010   POSTMENOPAUSAL STATUS 10/24/2010    Past Surgical History:  Procedure Laterality Date   LAPAROSCOPIC CHOLECYSTECTOMY  2004   TOTAL ABDOMINAL HYSTERECTOMY  1981   w/ BSO for cancerous reasons    OB History   No obstetric history on file.      Home Medications    Prior to Admission medications   Medication Sig Start Date End Date Taking? Authorizing Provider  cephALEXin (KEFLEX) 500 MG capsule Take 2 capsules (1,000 mg total) by mouth 2 (two) times daily for 7 days. 01/22/23 01/29/23 Yes Trevor Iha, FNP  benazepril-hydrochlorthiazide (LOTENSIN HCT) 10-12.5 MG tablet Take 1 tablet by mouth daily. 08/04/22   Agapito Games, MD  betamethasone valerate (VALISONE) 0.1 % cream Apply topically at bedtime as needed. 10/09/21   Agapito Games, MD  Calcium Citrate-Vitamin D (CITRACAL + D PO) Take by mouth daily.     [provider]  rosuvastatin (CRESTOR) 10 MG tablet Take 1 tablet (10 mg total) by mouth daily. 08/04/22   Agapito Games, MD  vitamin B-12 (CYANOCOBALAMIN) 500 MCG tablet Take 500 mcg by mouth every other day.     [provider]    Family History Family History  Problem Relation Age of Onset   Other Mother 66       CHF   Lung cancer Father        75   Cancer Sister        liver   Breast cancer Sister    Lung cancer Brother        exposed to agent orange.    Hyperlipidemia Sister    Cancer Sister        stomach   Hypertension Daughter 11   Hyperlipidemia Maternal Uncle     Social History Social History   Tobacco Use   Smoking status: Never   Smokeless tobacco: Never  Vaping Use   Vaping Use: Never used  Substance Use Topics   Alcohol use: No   Drug use: No     Allergies   Bactrim [sulfamethoxazole-trimethoprim], Codeine, Latex, Macrobid [nitrofurantoin], and Zoloft [sertraline hcl]   Review of Systems Review of Systems  Genitourinary:  Positive for dysuria.  All other systems reviewed and are negative.    Physical Exam Triage Vital Signs ED Triage Vitals  Enc Vitals Group     BP      Pulse      Resp      Temp      Temp src      SpO2      Weight  Height      Head Circumference      Peak Flow      Pain Score      Pain Loc      Pain Edu?      Excl. in GC?    No data found.  Updated Vital Signs BP 132/76 (BP Location: Right Arm)   Pulse 70   Temp 98.5 F (36.9 C) (Oral)   Resp 17   SpO2 96%       Physical Exam Vitals and nursing note reviewed.  Constitutional:      Appearance: Normal appearance. She is normal weight.  HENT:     Head: Normocephalic and atraumatic.     Mouth/Throat:     Mouth: Mucous membranes are moist.     Pharynx: Oropharynx is clear.  Eyes:     Extraocular Movements: Extraocular movements intact.     Conjunctiva/sclera: Conjunctivae normal.     Pupils: Pupils are equal, round, and reactive to light.  Cardiovascular:     Rate and Rhythm: Normal rate and regular rhythm.     Pulses: Normal pulses.     Heart sounds: Normal heart sounds.  Pulmonary:     Effort: Pulmonary effort is normal.     Breath sounds:  Normal breath sounds. No wheezing, rhonchi or rales.  Abdominal:     Tenderness: There is no right CVA tenderness or left CVA tenderness.  Musculoskeletal:        General: Normal range of motion.     Cervical back: Normal range of motion and neck supple.  Skin:    General: Skin is warm and dry.  Neurological:     General: No focal deficit present.     Mental Status: She is alert and oriented to person, place, and time. Mental status is at baseline.  Psychiatric:        Mood and Affect: Mood normal.        Behavior: Behavior normal.      UC Treatments / Results  Labs (all labs ordered are listed, but only abnormal results are displayed) Labs Reviewed  POCT URINALYSIS DIP (MANUAL ENTRY) - Abnormal; Notable for the following components:      Result Value   Clarity, UA cloudy (*)    Blood, UA moderate (*)    Leukocytes, UA Large (3+) (*)    All other components within normal limits  URINE CULTURE    EKG   Radiology No results found.  Procedures Procedures (including critical care time)  Medications Ordered in UC Medications - No data to display  Initial Impression / Assessment and Plan / UC Course  I have reviewed the triage vital signs and the nursing notes.  Pertinent labs & imaging results that were available during my care of the patient were reviewed by me and considered in my medical decision making (see chart for details).     MDM: 1.  Acute cystitis with hematuria-UA revealed above, urine culture ordered, Rx'd Keflex 1 g twice daily x 7 days. Instructed patient to take medication as directed with food to completion.  Encouraged increase daily water intake to 64 ounces per day while taking this medication.  Advised we will follow-up with urine culture results once received.  Advised if symptoms worsen and/or unresolved please follow-up with PCP or here for further evaluation.  Patient discharged home, hemodynamically stable.  Final Clinical Impressions(s) / UC  Diagnoses   Final diagnoses:  Dysuria  Acute cystitis with hematuria  Discharge Instructions      Instructed patient to take medication as directed with food to completion.  Encouraged increase daily water intake to 64 ounces per day while taking this medication.  Advised we will follow-up with urine culture results once received.  Advised if symptoms worsen and/or unresolved please follow-up with PCP or here for further evaluation.     ED Prescriptions     Medication Sig Dispense Auth. Provider   cephALEXin (KEFLEX) 500 MG capsule Take 2 capsules (1,000 mg total) by mouth 2 (two) times daily for 7 days. 28 capsule Trevor Iha, FNP      PDMP not reviewed this encounter.   Trevor Iha, FNP 01/22/23 1326

## 2023-01-22 NOTE — Discharge Instructions (Addendum)
Instructed patient to take medication as directed with food to completion.  Encouraged increase daily water intake to 64 ounces per day while taking this medication.  Advised we will follow-up with urine culture results once received.  Advised if symptoms worsen and/or unresolved please follow-up with PCP or here for further evaluation. 

## 2023-01-22 NOTE — ED Triage Notes (Signed)
Pt c/o dysuria x 2 days

## 2023-01-23 LAB — URINE CULTURE: Culture: >=100000 — AB

## 2023-01-24 LAB — URINE CULTURE

## 2023-02-02 ENCOUNTER — Ambulatory Visit: Payer: Medicare HMO | Admitting: Family Medicine

## 2023-02-09 ENCOUNTER — Ambulatory Visit (INDEPENDENT_AMBULATORY_CARE_PROVIDER_SITE_OTHER): Payer: Medicare HMO | Admitting: Family Medicine

## 2023-02-09 ENCOUNTER — Encounter: Payer: Self-pay | Admitting: Family Medicine

## 2023-02-09 VITALS — BP 136/66 | HR 74 | Wt 162.0 lb

## 2023-02-09 DIAGNOSIS — Z636 Dependent relative needing care at home: Secondary | ICD-10-CM | POA: Diagnosis not present

## 2023-02-09 DIAGNOSIS — M8580 Other specified disorders of bone density and structure, unspecified site: Secondary | ICD-10-CM

## 2023-02-09 DIAGNOSIS — I10 Essential (primary) hypertension: Secondary | ICD-10-CM | POA: Diagnosis not present

## 2023-02-09 NOTE — Assessment & Plan Note (Signed)
Blood pressure at goal today.  Continue her current regimen she says at home she is getting some systolic 120s.  Due for BMP today.

## 2023-02-09 NOTE — Assessment & Plan Note (Signed)
Now she is doing well and has some good support from family members.  They do try to help her when they can.

## 2023-02-09 NOTE — Assessment & Plan Note (Addendum)
Due for updated bone density last T-score was -1.6, 6 years ago.

## 2023-02-09 NOTE — Progress Notes (Signed)
   Established Patient Office Visit  Subjective   Patient ID: Michelle Lawrence, female    DOB: 12/04/1945  Age: 77 y.o. MRN: [id number]  Chief Complaint  Patient presents with   Hypertension    HPI She is doing well overall. She has been under stress taking care of her her husband.  He has cancer and nephrostomy tubes and then was recently diagnosed with throat cancer so he has been undergoing radiation treatments.  He has not been sleeping as well lately sometimes just waking up and having a hard time going back to sleep.  Hypertension- Pt denies chest pain, SOB, dizziness, or heart palpitations.  Taking meds as directed w/o problems.  Denies medication side effects.   Earlier this months she was seen in urgent care for UTI.  She did complete her antibiotics and says she is feeling much better.    ROS    Objective:     BP 136/66   Pulse 74   Wt 162 lb (73.5 kg)   SpO2 95%   BMI 27.81 kg/m    Physical Exam Vitals and nursing note reviewed.  Constitutional:      Appearance: She is well-developed.  HENT:     Head: Normocephalic and atraumatic.  Cardiovascular:     Rate and Rhythm: Normal rate and regular rhythm.     Heart sounds: Normal heart sounds.  Pulmonary:     Effort: Pulmonary effort is normal.     Breath sounds: Normal breath sounds.  Skin:    General: Skin is warm and dry.  Neurological:     Mental Status: She is alert and oriented to person, place, and time.  Psychiatric:        Behavior: Behavior normal.      No results found for any visits on 02/09/23.    The 10-year ASCVD risk score (Arnett DK, et al., 2019) is: 36.7%    Assessment & Plan:   Problem List Items Addressed This Visit       Cardiovascular and Mediastinum   Essential hypertension, benign - Primary    Blood pressure at goal today.  Continue her current regimen she says at home she is getting some systolic 120s.  Due for BMP today.      Relevant Orders   BASIC METABOLIC  PANEL WITH GFR     Musculoskeletal and Integument   Osteopenia    Due for updated bone density last T-score was -1.6, 6 years ago.      Relevant Orders   DG Bone Density     Other   Caregiver stress    Now she is doing well and has some good support from family members.  They do try to help her when they can.       Feels like recent UTI sxs have cleared.   Return in about 6 months (around 08/16/2023) for Hypertension.   I spent 25 minutes on the day of the encounter to include pre-visit record review, face-to-face time with the patient and post visit ordering of test.   Nani Gasser, MD

## 2023-02-10 LAB — BASIC METABOLIC PANEL WITH GFR
BUN/Creatinine Ratio: 33 (calc) — ABNORMAL HIGH (ref 6–22)
BUN: 14 mg/dL (ref 7–25)
CO2: 27 mmol/L (ref 20–32)
Calcium: 9.6 mg/dL (ref 8.6–10.4)
Chloride: 104 mmol/L (ref 98–110)
Creat: 0.43 mg/dL — ABNORMAL LOW (ref 0.60–1.00)
Glucose, Bld: 83 mg/dL (ref 65–99)
Potassium: 4.3 mmol/L (ref 3.5–5.3)
Sodium: 140 mmol/L (ref 135–146)
eGFR: 100 mL/min/{1.73_m2} (ref >=60)

## 2023-02-10 NOTE — Progress Notes (Signed)
Call patient: Kidney function is stable.

## 2023-03-24 ENCOUNTER — Encounter: Payer: Self-pay | Admitting: Family Medicine

## 2023-03-24 ENCOUNTER — Ambulatory Visit (INDEPENDENT_AMBULATORY_CARE_PROVIDER_SITE_OTHER): Payer: Medicare HMO | Admitting: Family Medicine

## 2023-03-24 ENCOUNTER — Ambulatory Visit: Payer: Medicare HMO

## 2023-03-24 VITALS — BP 130/71 | HR 89 | Temp 98.7°F | Ht 64.0 in | Wt 163.0 lb

## 2023-03-24 DIAGNOSIS — H6123 Impacted cerumen, bilateral: Secondary | ICD-10-CM | POA: Diagnosis not present

## 2023-03-24 DIAGNOSIS — R110 Nausea: Secondary | ICD-10-CM

## 2023-03-24 DIAGNOSIS — J029 Acute pharyngitis, unspecified: Secondary | ICD-10-CM

## 2023-03-24 LAB — POCT RAPID STREP A (OFFICE): Rapid Strep A Screen: NEGATIVE

## 2023-03-24 LAB — POC COVID19 BINAXNOW: SARS Coronavirus 2 Ag: NEGATIVE

## 2023-03-24 NOTE — Progress Notes (Signed)
Acute Office Visit  Subjective:     Patient ID: Michelle Lawrence, female    DOB: 1946-02-26, 77 y.o.   MRN: [id number]  Chief Complaint  Patient presents with   Nausea         HPI Patient is in today for mild sore throat and nausea.  It started about 4 days ago.  She does work in a nursing home and she says there is been several viruses including COVID going around.  She has not had any vomiting.  She says it is really just a sense of nausea and not wanting to eat but no abdominal pain.  No headaches.  But she has noticed that her ears have been popping.  No fevers or chills.  Decreased appetite.  No major changes in her bowels.  ROS      Objective:    BP 130/71   Pulse 89   Temp 98.7 F (37.1 C)   Ht 5\' 4"  (1.626 m)   Wt 163 lb (73.9 kg)   SpO2 96%   BMI 27.98 kg/m    Physical Exam Vitals and nursing note reviewed.  Constitutional:      Appearance: Normal appearance. She is well-developed.  HENT:     Head: Normocephalic and atraumatic.     Right Ear: External ear normal.     Left Ear: External ear normal.     Ears:     Comments: TMs are blocked by cerumen bilaterally.    Mouth/Throat:     Mouth: Mucous membranes are moist.     Pharynx: Posterior oropharyngeal erythema present. No oropharyngeal exudate.     Comments: Mild erythema of the posterior pharynx.  No exudate. Eyes:     Conjunctiva/sclera: Conjunctivae normal.  Cardiovascular:     Rate and Rhythm: Normal rate and regular rhythm.     Heart sounds: Normal heart sounds.  Pulmonary:     Effort: Pulmonary effort is normal.     Breath sounds: Normal breath sounds.  Abdominal:     General: Bowel sounds are normal. There is no distension.     Palpations: Abdomen is soft.     Tenderness: There is no abdominal tenderness.  Musculoskeletal:     Cervical back: Neck supple.  Skin:    General: Skin is warm and dry.  Neurological:     Mental Status: She is alert and oriented to person, place, and time.   Psychiatric:        Behavior: Behavior normal.     Results for orders placed or performed in visit on 03/24/23  POC COVID-19  Result Value Ref Range   SARS Coronavirus 2 Ag Negative Negative  POCT rapid strep A  Result Value Ref Range   Rapid Strep A Screen Negative Negative        Assessment & Plan:   Problem List Items Addressed This Visit   None Visit Diagnoses     Nausea    -  Primary   Relevant Orders   POC COVID-19 (Completed)   POCT rapid strep A (Completed)   CBC with Differential   Sore throat       Relevant Orders   POC COVID-19 (Completed)   POCT rapid strep A (Completed)   CBC with Differential   Bilateral impacted cerumen          SoreThroat/nausea-negative for strep throat and for COVID-19.  Will get a CBC with differential just to rule out other causes I suspect that it may be  viral.  Continue to hydrate well.  Develops any new or worsening symptoms or abdominal pain then please let me know.  Cerumen impaction - Earwax buildup I recommend getting an over-the-counter product called Debrox.  Just follow the instructions on the bottle.  He will put 4 drops in each ear twice a day for 4 days and then give your ear break for 3 days.  Then start again applying the drops for 4 days again.  Repeat this pattern for 3 to 4 weeks and the wax should slowly start to melt out.  If you are having any difficulty we are happy to also schedule you to have them irrigated.   No orders of the defined types were placed in this encounter.   Return if symptoms worsen or fail to improve.  Nani Gasser, MD

## 2023-03-24 NOTE — Addendum Note (Signed)
Addended by: Nani Gasser D on: 03/24/2023 01:51 PM   Modules accepted: Level of Service

## 2023-03-24 NOTE — Patient Instructions (Addendum)
Earwax buildup -  I recommend getting an over-the-counter product called Debrox.  Just follow the instructions on the bottle.  He will put 4 drops in each ear twice a day for 4 days and then give your ear break for 3 days.  Then start again applying the drops for 4 days again.  Repeat this pattern for 3 to 4 weeks and the wax should slowly start to melt out.  If you are having any difficulty we are happy to also schedule you to have them irrigated.

## 2023-03-25 ENCOUNTER — Telehealth: Payer: Self-pay | Admitting: Family Medicine

## 2023-03-25 NOTE — Telephone Encounter (Signed)
Pt returned your call. She said to let you know she is home now.

## 2023-03-25 NOTE — Telephone Encounter (Signed)
Patient has been advised by angela tuttle of lab results.

## 2023-03-25 NOTE — Progress Notes (Signed)
Count looks normal and reassuring.  Suspect this is just viral.  If not feeling better after the weekend then please let us know.  Just make sure to stay hydrated.

## 2023-03-26 ENCOUNTER — Telehealth: Payer: Self-pay

## 2023-03-26 NOTE — Telephone Encounter (Signed)
K to take Imodium but if she is good to be home this weekend and not at work then I would let her stomach just try to get things cleared out and hold on the Imodium but if she needs it to go to work then that is fine.

## 2023-03-26 NOTE — Telephone Encounter (Signed)
Patient called - states she was told that she likely had a virus.  Today she had diarrhea while at work and took Imodium. She is wanting to know if this is o.k. and if it is contraindicated with any of her medications.

## 2023-03-29 ENCOUNTER — Telehealth: Payer: Self-pay | Admitting: Family Medicine

## 2023-03-29 NOTE — Telephone Encounter (Signed)
Recommend Mucinex over-the-counter may make a 12-hour Mucinex can be taken twice a day.  The key is to take it with at least 68 ounces of water so that it is most effective and stay hydrated during the day.

## 2023-03-29 NOTE — Telephone Encounter (Signed)
Patient called in wanting a rx or recommendation for dry cough. States that she is sore from coughing and has no taste or appetite, was seen on Wed.

## 2023-03-30 NOTE — Telephone Encounter (Signed)
Attempted call again. Left voice mail message requesting a return call.

## 2023-03-30 NOTE — Telephone Encounter (Signed)
Pt called this morning. I gave patient Dr. Shelah Lewandowsky instructions.

## 2023-03-30 NOTE — Telephone Encounter (Signed)
Attempted call to patient . Phone rang without answer. Could not leave a voice mail message.

## 2023-03-31 ENCOUNTER — Telehealth: Payer: Self-pay | Admitting: Family Medicine

## 2023-03-31 NOTE — Telephone Encounter (Signed)
Pt advised to drink plenty of liquids and to stay hydrated, try hot tea or water with honey and lemon,suck on cough drops or hard candy.  Take a spoonful of honey. try a humidifier.  Pt stated that she still has no appetite when she does eat she feels "yucky"   She asks if she should get a CXR.   Spoke w/pcp advised to schedule pt for tomorrow.   Appt scheduled for tomorrow at 240 pm

## 2023-03-31 NOTE — Telephone Encounter (Signed)
Patient states she taking mucinex and it is "drying her up" and she still has no appeitite, Wants to know if she should stop taking it.

## 2023-04-01 ENCOUNTER — Ambulatory Visit: Payer: Medicare HMO | Admitting: Family Medicine

## 2023-04-01 ENCOUNTER — Encounter: Payer: Self-pay | Admitting: Family Medicine

## 2023-04-01 VITALS — BP 132/65 | HR 85 | Ht 64.0 in | Wt 163.0 lb

## 2023-04-01 DIAGNOSIS — R052 Subacute cough: Secondary | ICD-10-CM

## 2023-04-01 DIAGNOSIS — R682 Dry mouth, unspecified: Secondary | ICD-10-CM | POA: Diagnosis not present

## 2023-04-01 MED ORDER — BENAZEPRIL HCL 20 MG PO TABS: 20.0000 mg | ORAL_TABLET | Freq: Every day | ORAL | 1 refills | Status: DC

## 2023-04-01 NOTE — Telephone Encounter (Signed)
Pt has been contacted she was seen today.

## 2023-04-01 NOTE — Progress Notes (Signed)
Pt reports that she has not gotten any better. She continues to experience a cough that won't go away. She said that the mucinex dries her out and causes the cough to be dry and it hurts to cough.   She hasn't been able to eat anything she doesn't have an appetite and nothing tastes good or right.

## 2023-04-01 NOTE — Progress Notes (Signed)
Acute Office Visit  Subjective:     Patient ID: Michelle Lawrence, female    DOB: [birth year], 77 y.o.   MRN: [id number]  Chief Complaint  Patient presents with   Cough    HPI Patient is in today for  Pt reports that she has not gotten any better. She continues to experience a cough that won't go away. She said that the mucinex dries her out and causes the cough to be dry and it hurts to cough.  Feels like the cough is mostly in her upper airway. No nasal congestion or facial pressure. No ever.  Nausea is better.    She hasn't been able to eat anything she doesn't have an appetite and nothing tastes good or right.    She can still smell ok.    As of a dry mouth it has been gone for quite some time well before she got sick.  He says sometimes she will wake up in the middle the night and her mouth is just so completely dry.  She tries to drink water and keep it moisturized.  She is wondering if it could be a medication side effect.  ROS      Objective:    BP 132/65   Pulse 85   Ht 5\' 4"  (1.626 m)   Wt 163 lb (73.9 kg)   SpO2 96%   BMI 27.98 kg/m    Physical Exam Constitutional:      Appearance: She is well-developed.  HENT:     Head: Normocephalic and atraumatic.     Right Ear: External ear normal.     Left Ear: External ear normal.     Nose: Nose normal.     Mouth/Throat:     Pharynx: Oropharynx is clear.  Eyes:     Conjunctiva/sclera: Conjunctivae normal.     Pupils: Pupils are equal, round, and reactive to light.  Neck:     Thyroid: No thyromegaly.  Cardiovascular:     Rate and Rhythm: Normal rate and regular rhythm.     Heart sounds: Normal heart sounds.  Pulmonary:     Effort: Pulmonary effort is normal.     Breath sounds: Normal breath sounds. No wheezing.     Comments: Slight wheeze in the left upper lobe.   Musculoskeletal:     Cervical back: Neck supple.  Lymphadenopathy:     Cervical: No cervical adenopathy.  Skin:    General: Skin is warm and  dry.  Neurological:     Mental Status: She is alert and oriented to person, place, and time.     No results found for any visits on 04/01/23.      Assessment & Plan:   Problem List Items Addressed This Visit   None Visit Diagnoses     Subacute cough    -  Primary   Relevant Orders   DG Chest 2 View   Dry mouth          Dry mouth-we discussed stopping the HCTZ component of her blood pressure pill and then increasing the benazepril component to 20 mg to compensate.  It is really the only medication that she is taking that could be causing dry mouth.  We can certainly make that change and see if it is helpful.  If she still continues to struggle with that recommend keeping mouth moisturized, running a humidifier and we can even discuss some additional products that like XyliMelts or Biotene.  Cough-sounds like it mostly is  in the upper airway though she did have a slight wheeze in the left upper lung.  We discussed changing the medication trying to keep the throat more moisturized using warm teas and honey, will see if maybe by backing off on the HCTZ she is able to clear her throat more easily.  She says that it has been the most distressing part is that it just feels like it is hung and she cannot move it at home.  She already stopped the Mucinex which she felt like was making it worse.  If she is not better after the weekend then recommend come in for chest x-ray at her convenience on Monday.  Meds ordered this encounter  Medications   benazepril (LOTENSIN) 20 MG tablet    Sig: Take 1 tablet (20 mg total) by mouth daily.    Dispense:  90 tablet    Refill:  1    Return in about 1 month (around 05/02/2023) for Recheck BP on new pill .  Nani Gasser, MD

## 2023-04-02 ENCOUNTER — Ambulatory Visit (INDEPENDENT_AMBULATORY_CARE_PROVIDER_SITE_OTHER): Payer: Medicare HMO

## 2023-04-02 DIAGNOSIS — J181 Lobar pneumonia, unspecified organism: Secondary | ICD-10-CM | POA: Diagnosis not present

## 2023-04-02 DIAGNOSIS — R052 Subacute cough: Secondary | ICD-10-CM

## 2023-04-02 DIAGNOSIS — R918 Other nonspecific abnormal finding of lung field: Secondary | ICD-10-CM | POA: Diagnosis not present

## 2023-04-04 ENCOUNTER — Other Ambulatory Visit: Payer: Self-pay

## 2023-04-04 ENCOUNTER — Encounter: Payer: Self-pay | Admitting: Physician Assistant

## 2023-04-04 ENCOUNTER — Ambulatory Visit
Admission: EM | Admit: 2023-04-04 | Discharge: 2023-04-04 | Disposition: A | Discharge status: Home / Self Care | Payer: Medicare HMO | Attending: Physician Assistant | Admitting: Physician Assistant

## 2023-04-04 DIAGNOSIS — R051 Acute cough: Secondary | ICD-10-CM | POA: Diagnosis not present

## 2023-04-04 DIAGNOSIS — J40 Bronchitis, not specified as acute or chronic: Secondary | ICD-10-CM

## 2023-04-04 DIAGNOSIS — J329 Chronic sinusitis, unspecified: Secondary | ICD-10-CM

## 2023-04-04 MED ORDER — BENZONATATE 100 MG PO CAPS: 100.0000 mg | ORAL_CAPSULE | Freq: Three times a day (TID) | ORAL | 0 refills | Status: DC

## 2023-04-04 MED ORDER — AZITHROMYCIN 250 MG PO TABS: 250.0000 mg | ORAL_TABLET | Freq: Every day | ORAL | 0 refills | Status: DC

## 2023-04-04 MED ORDER — AMOXICILLIN-POT CLAVULANATE 500-125 MG PO TABS: 1.0000 | ORAL_TABLET | Freq: Two times a day (BID) | ORAL | 0 refills | Status: DC

## 2023-04-04 MED ORDER — AEROCHAMBER PLUS FLO-VU LARGE MISC
1.0000 | Freq: Once | Status: AC
  Administered 2023-04-04: 1

## 2023-04-04 MED ORDER — ALBUTEROL SULFATE HFA 108 (90 BASE) MCG/ACT IN AERS
2.0000 | INHALATION_SPRAY | Freq: Once | RESPIRATORY_TRACT | Status: AC
  Administered 2023-04-04: 2 via RESPIRATORY_TRACT

## 2023-04-04 NOTE — Discharge Instructions (Signed)
I am glad that you are feeling better after the inhaler.  You can use this every 4-6 hours as needed.  Use nasal saline and sinus rinses for additional symptom relief.  Take Tessalon for cough.  We are starting antibiotics to cover for an infection including pneumonia.  Take azithromycin as prescribed as well as Augmentin twice daily.  These can upset your stomach so take them with food.  Follow-up with your primary care as soon as possible.  If your symptoms or not improving within a few days or if anything worsens and you have worsening cough, fever, chest pain, nausea, vomiting, weakness you need to go to the emergency room.

## 2023-04-04 NOTE — ED Triage Notes (Signed)
Cough x few weeks. Reports has been having some chills. Had a cxr Friday but does not know the results of it.

## 2023-04-04 NOTE — ED Provider Notes (Signed)
[facility]    CSN: [id number] Arrival date & time: 04/04/23  1339      History   Chief Complaint No chief complaint on file.   HPI Michelle Lawrence is a 77 y.o. female.   Patient presents today with a several week history of worsening cough.  She was initially seen by her primary care on 03/24/2023 with several days of URI symptoms.  At that time COVID and strep were ruled out and symptoms were attributed to viral etiology.  She was seen by her primary care again on 04/01/2023 and cough was attributed to upper airway cough syndrome.  Chest x-ray was obtained 04/02/2023 but has not yet had a formal reading.  She has been taking Mucinex but this has been ineffective and actually worsened her symptoms.  She has not been taking any other over-the-counter medications for symptom management.  She denies any history of allergies, asthma, COPD, smoking.  She denies any recent antibiotics or steroids.  She has never used an inhaler.  She is having difficulty with her daily activities as result of symptoms.    Past Medical History:  Diagnosis Date   Cancer (HCC) 1981   enfometrial or cervical?   Hyperlipidemia    Hypertension     Patient Active Problem List   Diagnosis Date Noted   Caregiver stress 02/09/2023   Palpitations 02/22/2020   Dyshidrotic hand dermatitis 11/09/2019   Nocturia 11/09/2019   MRSA (methicillin resistant staph aureus) culture positive 08/28/2013   Osteopenia 12/17/2010   UNSPECIFIED VITAMIN D DEFICIENCY 10/24/2010   Hyperlipemia 10/24/2010   Essential hypertension, benign 10/24/2010   EDEMA 10/24/2010   POSTMENOPAUSAL STATUS 10/24/2010    Past Surgical History:  Procedure Laterality Date   LAPAROSCOPIC CHOLECYSTECTOMY  2004   TOTAL ABDOMINAL HYSTERECTOMY  1981   w/ BSO for cancerous reasons    OB History   No obstetric history on file.      Home Medications    Prior to Admission medications   Medication Sig Start Date End Date Taking?  Authorizing Provider  amoxicillin-clavulanate (AUGMENTIN) 500-125 MG tablet Take 1 tablet by mouth in the morning and at bedtime. 04/04/23  Yes Julita Ozbun K, PA-C  azithromycin (ZITHROMAX) 250 MG tablet Take 1 tablet (250 mg total) by mouth daily. Take first 2 tablets together, then 1 every day until finished. 04/04/23  Yes Blanch Stang K, PA-C  benzonatate (TESSALON) 100 MG capsule Take 1 capsule (100 mg total) by mouth every 8 (eight) hours. 04/04/23  Yes Jmarion Christiano K, PA-C  benazepril (LOTENSIN) 20 MG tablet Take 1 tablet (20 mg total) by mouth daily. 04/01/23   Agapito Games, MD  Calcium Citrate-Vitamin D (CITRACAL + D PO) Take by mouth daily.     [provider]  rosuvastatin (CRESTOR) 10 MG tablet Take 1 tablet (10 mg total) by mouth daily. 08/04/22   Agapito Games, MD  vitamin B-12 (CYANOCOBALAMIN) 500 MCG tablet Take 500 mcg by mouth every other day.    [provider]    Family History Family History  Problem Relation Age of Onset   Other Mother 88       CHF   Lung cancer Father        47   Cancer Sister        liver   Breast cancer Sister    Lung cancer Brother        exposed to agent orange.    Hyperlipidemia Sister  Cancer Sister        stomach   Hypertension Daughter 50   Hyperlipidemia Maternal Uncle     Social History Social History   Tobacco Use   Smoking status: Never   Smokeless tobacco: Never  Vaping Use   Vaping status: Never Used  Substance Use Topics   Alcohol use: No   Drug use: No     Allergies   Bactrim [sulfamethoxazole-trimethoprim], Codeine, Latex, Macrobid [nitrofurantoin], and Zoloft [sertraline hcl]   Review of Systems Review of Systems  Constitutional:  Positive for activity change, appetite change and fatigue. Negative for fever.  HENT:  Positive for congestion and sore throat. Negative for sinus pressure and sneezing.   Respiratory:  Positive for cough, shortness of breath and wheezing. Negative  for chest tightness.   Cardiovascular:  Negative for chest pain.  Gastrointestinal:  Negative for abdominal pain, diarrhea, nausea and vomiting.     Physical Exam Triage Vital Signs ED Triage Vitals  Encounter Vitals Group     BP 04/04/23 1355 (!) 166/78     Systolic BP Percentile --      Diastolic BP Percentile --      Pulse Rate 04/04/23 1355 96     Resp 04/04/23 1355 16     Temp 04/04/23 1355 99.5 F (37.5 C)     Temp Source 04/04/23 1355 Oral     SpO2 04/04/23 1355 95 %     Weight --      Height --      Head Circumference --      Peak Flow --      Pain Score 04/04/23 1356 3     Pain Loc --      Pain Education --      Exclude from Growth Chart --    No data found.  Updated Vital Signs BP (!) 166/78 (BP Location: Right Arm)   Pulse 96   Temp 99.5 F (37.5 C) (Oral)   Resp 16   SpO2 95%   Visual Acuity Right Eye Distance:   Left Eye Distance:   Bilateral Distance:    Right Eye Near:   Left Eye Near:    Bilateral Near:     Physical Exam Vitals reviewed.  Constitutional:      General: She is awake. She is not in acute distress.    Appearance: Normal appearance. She is well-developed. She is not ill-appearing.     Comments: Very pleasant female appears stated age in no acute distress sitting comfortably in exam room  HENT:     Head: Normocephalic and atraumatic.     Right Ear: External ear normal. There is impacted cerumen.     Left Ear: Tympanic membrane, ear canal and external ear normal. Tympanic membrane is not erythematous or bulging.     Nose:     Right Sinus: No maxillary sinus tenderness or frontal sinus tenderness.     Left Sinus: No maxillary sinus tenderness or frontal sinus tenderness.     Mouth/Throat:     Pharynx: Uvula midline. Postnasal drip present. No oropharyngeal exudate or posterior oropharyngeal erythema.  Cardiovascular:     Rate and Rhythm: Normal rate and regular rhythm.     Heart sounds: Normal heart sounds, S1 normal and S2  normal. No murmur heard. Pulmonary:     Effort: Pulmonary effort is normal.     Breath sounds: Normal breath sounds. No wheezing, rhonchi or rales.     Comments: Scattered wheezing worse left lung  field Psychiatric:        Behavior: Behavior is cooperative.      UC Treatments / Results  Labs (all labs ordered are listed, but only abnormal results are displayed) Labs Reviewed - No data to display  EKG   Radiology No results found.  Procedures Procedures (including critical care time)  Medications Ordered in UC Medications  albuterol (VENTOLIN HFA) 108 (90 Base) MCG/ACT inhaler 2 puff (2 puffs Inhalation Given 04/04/23 1428)  AeroChamber Plus Flo-Vu Large MISC 1 each (1 each Other Given 04/04/23 1428)    Initial Impression / Assessment and Plan / UC Course  I have reviewed the triage vital signs and the nursing notes.  Pertinent labs & imaging results that were available during my care of the patient were reviewed by me and considered in my medical decision making (see chart for details).     Patient is well-appearing, afebrile, nontoxic, nontachycardic.  Viral testing was deferred as she has already been symptomatic for several weeks and this would not change management.  She did have some wheezing on exam that significantly proved following a dose of albuterol in clinic.  She was sent home with this medication with instruction how to properly use every 4-6 hours as needed.  She declined to repeat x-ray today but review of x-ray from 04/02/2023 showed increased peribronchial thickening with some patchy opacity in right middle lobe.  Will cover for CAP given her worsening symptoms and she was started on azithromycin as well as Augmentin.  No indication for dose adjustment based on metabolic panel from 02/09/2023 with creatinine of 0.43 and calculated creatinine clearance of 127.88 mL/min.  Recommend that she rest and drink plenty of fluid.  We did discuss symptoms utility of obtaining  CBC and CMP but she declined this today and will follow-up with her primary care to have this done in the future.  I did recommend she follow-up with her PCP as soon as possible to ensure improvement of symptoms.  We discussed that if at any point she has worsening symptoms including fever, increasing cough, shortness of breath, nausea/vomiting interfering with oral intake, weakness she needs to go to the emergency room.  All questions were answered to patient satisfaction.  Final Clinical Impressions(s) / UC Diagnoses   Final diagnoses:  Sinobronchitis  Acute cough     Discharge Instructions      I am glad that you are feeling better after the inhaler.  You can use this every 4-6 hours as needed.  Use nasal saline and sinus rinses for additional symptom relief.  Take Tessalon for cough.  We are starting antibiotics to cover for an infection including pneumonia.  Take azithromycin as prescribed as well as Augmentin twice daily.  These can upset your stomach so take them with food.  Follow-up with your primary care as soon as possible.  If your symptoms or not improving within a few days or if anything worsens and you have worsening cough, fever, chest pain, nausea, vomiting, weakness you need to go to the emergency room.     ED Prescriptions     Medication Sig Dispense Auth. Provider   benzonatate (TESSALON) 100 MG capsule Take 1 capsule (100 mg total) by mouth every 8 (eight) hours. 21 capsule Lianne Carreto K, PA-C   amoxicillin-clavulanate (AUGMENTIN) 500-125 MG tablet Take 1 tablet by mouth in the morning and at bedtime. 14 tablet Anedra Penafiel K, PA-C   azithromycin (ZITHROMAX) 250 MG tablet Take 1 tablet (250 mg  total) by mouth daily. Take first 2 tablets together, then 1 every day until finished. 6 tablet Kenard Morawski, Noberto Retort, PA-C      PDMP not reviewed this encounter.   Jeani Hawking, PA-C 04/04/23 1449

## 2023-04-07 NOTE — Progress Notes (Signed)
Is call patient and see if she is feeling any better after the azithromycin I saw where she went to urgent care a few days ago and they put her on a Z-Pak.  Has that helped?

## 2023-04-14 ENCOUNTER — Encounter: Payer: Self-pay | Admitting: Family Medicine

## 2023-04-14 ENCOUNTER — Ambulatory Visit (INDEPENDENT_AMBULATORY_CARE_PROVIDER_SITE_OTHER): Payer: Medicare HMO | Admitting: Family Medicine

## 2023-04-14 VITALS — BP 134/70 | HR 89 | Ht 64.0 in | Wt 159.0 lb

## 2023-04-14 DIAGNOSIS — J189 Pneumonia, unspecified organism: Secondary | ICD-10-CM | POA: Diagnosis not present

## 2023-04-14 NOTE — Progress Notes (Signed)
   Acute Office Visit  Subjective:     Patient ID: Michelle Lawrence, female    DOB: 03/30/1946, 77 y.o.   MRN: [id number]  Chief Complaint  Patient presents with   Follow-up    Pt here to f/u after being seen at [facility] She feels that she is doing some     HPI Patient is in today for follow-up pneumonia.  Actually saw patient on August 15.  We did a chest x-ray at that time she ended up feeling like she was getting worse so she went to urgent care on the 18th, 3 days later because of worsening cough.  They ended up putting her on azithromycin and Augmentin and Tessalon Perles.  Chest x-ray result came back on August 21 showing possible pneumonia on the right side.  She is feeling some better.  Still an occasional cough but much improved.  Her biggest issue is that she is got a decreased appetite and just fatigue.  She also had diarrhea initially with the antibiotic but that seems to have gotten better as well.  Foods just taste very bland.    ROS      Objective:    BP 134/70   Pulse 89   Ht 5\' 4"  (1.626 m)   Wt 159 lb (72.1 kg)   SpO2 94%   BMI 27.29 kg/m    Physical Exam Vitals and nursing note reviewed.  Constitutional:      Appearance: Normal appearance.  HENT:     Head: Normocephalic and atraumatic.  Eyes:     Conjunctiva/sclera: Conjunctivae normal.  Cardiovascular:     Rate and Rhythm: Normal rate and regular rhythm.  Pulmonary:     Effort: Pulmonary effort is normal.     Breath sounds: Normal breath sounds.  Skin:    General: Skin is warm and dry.  Neurological:     Mental Status: She is alert.  Psychiatric:        Mood and Affect: Mood normal.     No results found for any visits on 04/14/23.      Assessment & Plan:   Problem List Items Addressed This Visit   None Visit Diagnoses     Pneumonia of right lung due to infectious organism, unspecified part of lung    -  Primary      Right sided pneumonia-she overall is feeling much better still  getting an occasional cough.  Biggest thing she struggling with is just feeling really tired and having a decreased appetite it may take several weeks for that to completely improve but lung exam is clear today which is very reassuring if she does not continue to improve then please let us know.  Continue to make sure g eating red really even though her appetite is down.  No orders of the defined types were placed in this encounter.   No follow-ups on file.  Nani Gasser, MD

## 2023-04-16 ENCOUNTER — Telehealth: Payer: Self-pay | Admitting: Family Medicine

## 2023-04-16 NOTE — Telephone Encounter (Signed)
error 

## 2023-04-28 ENCOUNTER — Ambulatory Visit (INDEPENDENT_AMBULATORY_CARE_PROVIDER_SITE_OTHER): Payer: Medicare HMO

## 2023-04-28 DIAGNOSIS — M8580 Other specified disorders of bone density and structure, unspecified site: Secondary | ICD-10-CM | POA: Diagnosis not present

## 2023-04-28 DIAGNOSIS — M8588 Other specified disorders of bone density and structure, other site: Secondary | ICD-10-CM | POA: Diagnosis not present

## 2023-04-28 DIAGNOSIS — Z780 Asymptomatic menopausal state: Secondary | ICD-10-CM | POA: Diagnosis not present

## 2023-04-29 NOTE — Progress Notes (Signed)
Call patient: Bone density testing shows a T-score of -1.8.  Consistent with osteopenia or mildly thin bones.  Slight decline compared to 6 years ago continue with weightbearing exercise and getting an adequate calcium and vitamin D in the diet.  Will plan to recheck your DEXA in 2 years.

## 2023-05-03 ENCOUNTER — Encounter: Payer: Self-pay | Admitting: Family Medicine

## 2023-05-03 ENCOUNTER — Ambulatory Visit (INDEPENDENT_AMBULATORY_CARE_PROVIDER_SITE_OTHER): Payer: Medicare HMO | Admitting: Family Medicine

## 2023-05-03 VITALS — BP 128/72 | HR 78 | Ht 64.0 in | Wt 160.0 lb

## 2023-05-03 DIAGNOSIS — R051 Acute cough: Secondary | ICD-10-CM

## 2023-05-03 DIAGNOSIS — R9389 Abnormal findings on diagnostic imaging of other specified body structures: Secondary | ICD-10-CM

## 2023-05-03 DIAGNOSIS — I10 Essential (primary) hypertension: Secondary | ICD-10-CM

## 2023-05-03 DIAGNOSIS — E7800 Pure hypercholesterolemia, unspecified: Secondary | ICD-10-CM | POA: Diagnosis not present

## 2023-05-03 NOTE — Progress Notes (Signed)
Established Patient Office Visit  Subjective   Patient ID: Michelle Lawrence, female    DOB: 05/12/1946  Age: 77 y.o. MRN: [id number]  Chief Complaint  Patient presents with   Hypertension    HPI  Hypertension- Pt denies chest pain, SOB, dizziness, or heart palpitations.  Taking meds as directed w/o problems.  Denies medication side effects.  We does not think she component of her blood pressure pill because it was causing some dry mouth she says the dry mouth is not completely gone but it is a lot better.  BP at home getting mostly in the 130s over 70s.   In regards to the pneumonia in the right lung she feels like she is mostly better but still struggling with occasional cough and mucus production she is here getting an occasional wheeze at night it has been about 8 weeks at this point.    ROS    Objective:     BP 128/72   Pulse 78   Ht 5\' 4"  (1.626 m)   Wt 160 lb (72.6 kg)   SpO2 98%   BMI 27.46 kg/m    Physical Exam Vitals and nursing note reviewed.  Constitutional:      Appearance: Normal appearance.  HENT:     Head: Normocephalic and atraumatic.  Eyes:     Conjunctiva/sclera: Conjunctivae normal.  Cardiovascular:     Rate and Rhythm: Normal rate and regular rhythm.  Pulmonary:     Effort: Pulmonary effort is normal.     Breath sounds: Normal breath sounds.     Comments: Slight exp wheeze bilat Skin:    General: Skin is warm and dry.  Neurological:     Mental Status: She is alert.  Psychiatric:        Mood and Affect: Mood normal.      No results found for any visits on 05/03/23.    The 10-year ASCVD risk score (Arnett DK, et al., 2019) is: 25%    Assessment & Plan:   Problem List Items Addressed This Visit       Cardiovascular and Mediastinum   Essential hypertension, benign    Repeat  blood pressure looks much better today.  We had increased the benazepril to compensate for taking away the HCTZ component.  It looks great.  Recommend  recheck BMP.      Relevant Orders   Basic Metabolic Panel (BMET)     Other   Hyperlipemia    Tolerating statin well.       Other Visit Diagnoses     Acute cough    -  Primary   Relevant Orders   DG Chest 2 View   Abnormal chest x-ray       Relevant Orders   DG Chest 2 View      Acute cough-she is much better but still having a little bit of a productive cough with occasional wheeze they did recommend that we repeat her chest x-ray so we will do that today if she has time.  Chest x-ray-will repeat chest x-ray today.    Return in about 6 months (around 10/31/2023) for Hypertension.    Nani Gasser, MD

## 2023-05-03 NOTE — Assessment & Plan Note (Signed)
Repeat  blood pressure looks much better today.  We had increased the benazepril to compensate for taking away the HCTZ component.  It looks great.  Recommend recheck BMP.

## 2023-05-03 NOTE — Assessment & Plan Note (Signed)
Tolerating statin well.

## 2023-05-04 LAB — BASIC METABOLIC PANEL
BUN/Creatinine Ratio: 24 (ref 12–28)
BUN: 15 mg/dL (ref 8–27)
CO2: 24 mmol/L (ref 20–29)
Calcium: 9.9 mg/dL (ref 8.7–10.3)
Chloride: 102 mmol/L (ref 96–106)
Creatinine, Ser: 0.62 mg/dL (ref 0.57–1.00)
Glucose: 85 mg/dL (ref 70–99)
Potassium: 4.7 mmol/L (ref 3.5–5.2)
Sodium: 141 mmol/L (ref 134–144)
eGFR: 92 mL/min/{1.73_m2} (ref >59)

## 2023-05-04 NOTE — Progress Notes (Signed)
Your lab work is within acceptable range and there are no concerning findings.   ?

## 2023-05-07 ENCOUNTER — Ambulatory Visit: Payer: Medicare HMO

## 2023-05-07 DIAGNOSIS — R051 Acute cough: Secondary | ICD-10-CM | POA: Diagnosis not present

## 2023-05-07 DIAGNOSIS — R9389 Abnormal findings on diagnostic imaging of other specified body structures: Secondary | ICD-10-CM

## 2023-05-07 DIAGNOSIS — J189 Pneumonia, unspecified organism: Secondary | ICD-10-CM | POA: Diagnosis not present

## 2023-05-10 ENCOUNTER — Ambulatory Visit (INDEPENDENT_AMBULATORY_CARE_PROVIDER_SITE_OTHER): Payer: Medicare HMO

## 2023-05-10 DIAGNOSIS — Z23 Encounter for immunization: Secondary | ICD-10-CM

## 2023-05-17 ENCOUNTER — Ambulatory Visit: Payer: Medicare HMO

## 2023-05-17 ENCOUNTER — Telehealth: Payer: Self-pay | Admitting: Family Medicine

## 2023-05-17 NOTE — Telephone Encounter (Signed)
Pt called wondering if results from chest x-ray were back. X-ray was taken over a week ago. Please advise.

## 2023-05-18 ENCOUNTER — Other Ambulatory Visit: Payer: Self-pay | Admitting: Family Medicine

## 2023-05-18 DIAGNOSIS — Z1231 Encounter for screening mammogram for malignant neoplasm of breast: Secondary | ICD-10-CM

## 2023-05-19 DIAGNOSIS — H02831 Dermatochalasis of right upper eyelid: Secondary | ICD-10-CM | POA: Diagnosis not present

## 2023-05-19 DIAGNOSIS — H02834 Dermatochalasis of left upper eyelid: Secondary | ICD-10-CM | POA: Diagnosis not present

## 2023-05-19 DIAGNOSIS — H52223 Regular astigmatism, bilateral: Secondary | ICD-10-CM | POA: Diagnosis not present

## 2023-05-19 DIAGNOSIS — H25813 Combined forms of age-related cataract, bilateral: Secondary | ICD-10-CM | POA: Diagnosis not present

## 2023-05-19 DIAGNOSIS — H35722 Serous detachment of retinal pigment epithelium, left eye: Secondary | ICD-10-CM | POA: Diagnosis not present

## 2023-05-19 DIAGNOSIS — H5213 Myopia, bilateral: Secondary | ICD-10-CM | POA: Diagnosis not present

## 2023-05-19 DIAGNOSIS — H35363 Drusen (degenerative) of macula, bilateral: Secondary | ICD-10-CM | POA: Diagnosis not present

## 2023-05-27 ENCOUNTER — Other Ambulatory Visit: Payer: Self-pay | Admitting: Family Medicine

## 2023-05-27 DIAGNOSIS — R9389 Abnormal findings on diagnostic imaging of other specified body structures: Secondary | ICD-10-CM

## 2023-05-27 NOTE — Progress Notes (Signed)
Please call patient and let her know that the area looks a little worse in her right upper lung on repeat chest x-ray so they are recommending that we get a CT to evaluate this further.  I am going to go ahead and place a new order.  And we will try to get this scheduled.

## 2023-06-07 ENCOUNTER — Ambulatory Visit: Payer: Medicare HMO

## 2023-06-07 DIAGNOSIS — R9389 Abnormal findings on diagnostic imaging of other specified body structures: Secondary | ICD-10-CM

## 2023-06-07 DIAGNOSIS — R918 Other nonspecific abnormal finding of lung field: Secondary | ICD-10-CM

## 2023-06-07 DIAGNOSIS — R911 Solitary pulmonary nodule: Secondary | ICD-10-CM

## 2023-06-07 DIAGNOSIS — J189 Pneumonia, unspecified organism: Secondary | ICD-10-CM | POA: Diagnosis not present

## 2023-06-07 DIAGNOSIS — J984 Other disorders of lung: Secondary | ICD-10-CM | POA: Diagnosis not present

## 2023-06-07 MED ORDER — IOHEXOL 300 MG/ML  SOLN
100.0000 mL | Freq: Once | INTRAMUSCULAR | Status: AC | PRN
  Administered 2023-06-07: 75 mL via INTRAVENOUS

## 2023-06-17 ENCOUNTER — Telehealth: Payer: Self-pay | Admitting: Family Medicine

## 2023-06-17 NOTE — Telephone Encounter (Signed)
Patient called requesting an update on her CT scan. Patient is still coughing up mucus/flem and would like to know if anything could be prescribed to help.

## 2023-06-27 ENCOUNTER — Other Ambulatory Visit: Payer: Self-pay | Admitting: Family Medicine

## 2023-06-27 DIAGNOSIS — E7800 Pure hypercholesterolemia, unspecified: Secondary | ICD-10-CM

## 2023-06-30 ENCOUNTER — Ambulatory Visit: Payer: Medicare HMO

## 2023-06-30 ENCOUNTER — Telehealth: Payer: Self-pay | Admitting: Family Medicine

## 2023-06-30 DIAGNOSIS — Z1231 Encounter for screening mammogram for malignant neoplasm of breast: Secondary | ICD-10-CM

## 2023-06-30 DIAGNOSIS — R918 Other nonspecific abnormal finding of lung field: Secondary | ICD-10-CM

## 2023-06-30 NOTE — Telephone Encounter (Signed)
Called patient and discussed CT results.  Orders placed.  She says she has an occasional slight cough for should get a little mucus up it feels like it is more in her upper throat and it is not very frequent she reports that she is otherwise been feeling fine no other signs or symptoms.  No shortness of breath.  Orders Placed This Encounter  Procedures   Ambulatory referral to Hematology / Oncology    Referral Priority:   Urgent    Referral Type:   Consultation    Referral Reason:   Specialty Services Required    Requested Specialty:   Oncology    Number of Visits Requested:   1   Ambulatory referral to Cardiothoracic Surgery    Referral Priority:   Routine    Referral Type:   Surgical    Referral Reason:   Specialty Services Required    Requested Specialty:   Cardiothoracic Surgery    Number of Visits Requested:   1

## 2023-06-30 NOTE — Progress Notes (Signed)
See phone note

## 2023-06-30 NOTE — Telephone Encounter (Signed)
Dr. Linford Arnold to call pt.

## 2023-07-02 NOTE — Progress Notes (Signed)
Please call patient. Normal mammogram.  Repeat in 1 year.  

## 2023-07-08 DIAGNOSIS — M8589 Other specified disorders of bone density and structure, multiple sites: Secondary | ICD-10-CM | POA: Diagnosis not present

## 2023-07-08 DIAGNOSIS — Z888 Allergy status to other drugs, medicaments and biological substances status: Secondary | ICD-10-CM | POA: Diagnosis not present

## 2023-07-08 DIAGNOSIS — E559 Vitamin D deficiency, unspecified: Secondary | ICD-10-CM | POA: Diagnosis not present

## 2023-07-08 DIAGNOSIS — E785 Hyperlipidemia, unspecified: Secondary | ICD-10-CM | POA: Diagnosis not present

## 2023-07-08 DIAGNOSIS — R918 Other nonspecific abnormal finding of lung field: Secondary | ICD-10-CM | POA: Diagnosis not present

## 2023-07-08 DIAGNOSIS — Z91040 Latex allergy status: Secondary | ICD-10-CM | POA: Diagnosis not present

## 2023-07-08 DIAGNOSIS — M8580 Other specified disorders of bone density and structure, unspecified site: Secondary | ICD-10-CM | POA: Diagnosis not present

## 2023-07-08 DIAGNOSIS — Z885 Allergy status to narcotic agent status: Secondary | ICD-10-CM | POA: Diagnosis not present

## 2023-07-08 DIAGNOSIS — I10 Essential (primary) hypertension: Secondary | ICD-10-CM | POA: Diagnosis not present

## 2023-07-12 DIAGNOSIS — R918 Other nonspecific abnormal finding of lung field: Secondary | ICD-10-CM | POA: Diagnosis not present

## 2023-07-12 DIAGNOSIS — Z79899 Other long term (current) drug therapy: Secondary | ICD-10-CM | POA: Diagnosis not present

## 2023-07-12 DIAGNOSIS — I371 Nonrheumatic pulmonary valve insufficiency: Secondary | ICD-10-CM | POA: Diagnosis not present

## 2023-07-12 DIAGNOSIS — I061 Rheumatic aortic insufficiency: Secondary | ICD-10-CM | POA: Diagnosis not present

## 2023-07-20 DIAGNOSIS — C7951 Secondary malignant neoplasm of bone: Secondary | ICD-10-CM | POA: Diagnosis not present

## 2023-07-20 DIAGNOSIS — C771 Secondary and unspecified malignant neoplasm of intrathoracic lymph nodes: Secondary | ICD-10-CM | POA: Diagnosis not present

## 2023-07-20 DIAGNOSIS — C3411 Malignant neoplasm of upper lobe, right bronchus or lung: Secondary | ICD-10-CM | POA: Diagnosis not present

## 2023-07-20 DIAGNOSIS — E278 Other specified disorders of adrenal gland: Secondary | ICD-10-CM | POA: Diagnosis not present

## 2023-07-20 DIAGNOSIS — R590 Localized enlarged lymph nodes: Secondary | ICD-10-CM | POA: Diagnosis not present

## 2023-07-20 DIAGNOSIS — R918 Other nonspecific abnormal finding of lung field: Secondary | ICD-10-CM | POA: Diagnosis not present

## 2023-07-20 DIAGNOSIS — M898X8 Other specified disorders of bone, other site: Secondary | ICD-10-CM | POA: Diagnosis not present

## 2023-07-26 ENCOUNTER — Other Ambulatory Visit: Payer: Self-pay | Admitting: *Deleted

## 2023-07-26 ENCOUNTER — Other Ambulatory Visit (INDEPENDENT_AMBULATORY_CARE_PROVIDER_SITE_OTHER): Payer: Medicare HMO | Admitting: *Deleted

## 2023-07-26 DIAGNOSIS — M545 Low back pain, unspecified: Secondary | ICD-10-CM | POA: Diagnosis not present

## 2023-07-26 LAB — POCT URINALYSIS DIP (CLINITEK)
Bilirubin, UA: NEGATIVE
Blood, UA: NEGATIVE
Glucose, UA: NEGATIVE mg/dL
Ketones, POC UA: NEGATIVE mg/dL
Nitrite, UA: NEGATIVE
POC PROTEIN,UA: NEGATIVE
Spec Grav, UA: 1.015 (ref 1.010–1.025)
Urobilinogen, UA: 0.2 U/dL
pH, UA: 7 (ref 5.0–8.0)

## 2023-07-26 NOTE — Progress Notes (Signed)
Will send for culture.

## 2023-07-28 LAB — URINE CULTURE

## 2023-07-28 NOTE — Progress Notes (Signed)
Call pt: urine culture is negative.

## 2023-07-30 ENCOUNTER — Ambulatory Visit: Payer: Self-pay | Admitting: Family Medicine

## 2023-07-30 NOTE — Telephone Encounter (Signed)
Copied from CRM [id number]. Topic: Clinical - Red Word Triage >> Jul 30, 2023  8:34 AM Gaetano Hawthorne wrote: Red Word that prompted transfer to Nurse Triage: Lower back pain - when laying on her side. Patient also complaining about neck pain/discomfort this morning. She was initially calling to see if she could get a script for pain meds from Dr. Linford Arnold - she has a recent diagnosis of cancer - she has an appointment with Dr. Abbe Amsterdam (oncology - [facility]) on the 18th, however, the doctor is out of the office currently. Patient has been taking tylenol but it only does so much.  Chief Complaint: Patient reports neck pain/discomfort this morning. She was initially calling to see if she could get a script for pain meds from Dr. Linford Arnold . Patient states she has a recent diagnosis of cancer  in 2 places. She has an appointment with Dr. Abbe Amsterdam (oncology - [facility]) on the 18th, however, the doctor is out of the office currently. Patient has been taking tylenol but it only does so much.Patient reports Moderate  low back pain and neck  pain feels like a crook. This is only at night and in the day time she is ok . Symptoms:Pain   only at night Frequency: Only at Night  Disposition: [] ED /[] Urgent Care (no appt availability in office) / [] Appointment(In office/virtual)/ []  [facility]/ [] Home Care/ [] Refused Recommended Disposition /[] [place] [facility]/ [x]  Follow-up with PCP Additional Notes: Patient will call Oncologist office  to see if there is an on call provider since she is under her oncologist care. Patient will call back if not able to speak with oncologist regarding her pain.

## 2023-07-30 NOTE — Telephone Encounter (Signed)
Copied from CRM [id number]. Topic: Clinical - Medical Advice >> Jul 30, 2023 10:24 AM Christen Butter S wrote: Reason for CRM:  Patient requested a call back to follow up on medication request for Dr Eppie Gibson  for her low back pain and neck pain.  Chief Complaint:  Patient was able to speak with oncall Oncologist and they advised her to take Ibuprofen and Tylenol for now and to call them back for any  concerns. Rn advise patient we are available as well. Symptoms: Low back pain and neck pain  Disposition: [] ED /[] Urgent Care (no appt availability in office) / [] Appointment(In office/virtual)/ []  [place] [facility]/ [x] Home Care/ [] Refused Recommended Disposition /[] [place] [facility]/ []  Follow-up with PCP Additional Notes: Patient scheduled to see Oncologist next Wednesday 07/18/23 to review PET SCAN and next steps. Reason for Disposition . [1] Follow-up call to recent contact AND [2] information only call, no triage required  Protocols used: Information Only Call - No Triage-A-AH

## 2023-07-30 NOTE — Telephone Encounter (Addendum)
Patient requested a call back to follow up on medication request for Dr Eppie Gibson  for her low back pain and neck pain.  Chief Complaint:  Patient was able to speak with oncall Oncologist and they advised her to take Ibuprofen and Tylenol for now and to call them back for any  concerns. Rn advise patient we are available as well. Symptoms: Low back pain and neck pain  Disposition: [] ED /[] Urgent Care (no appt availability in office) / [] Appointment(In office/virtual)/ []  [facility] [facility]/ [x] Home Care/ [] Refused Recommended Disposition /[]  Mobile Bus/ []  Follow-up with PCP Additional Notes: Patient is scheduled to see Oncologist next Wednesday to discuss the PET SCAN     Reason for Disposition  Preventing back strain, questions about  Answer Assessment - Initial Assessment Questions 1. ONSET: "When did the pain begin?"     Last few nights 2. LOCATION: "Where does it hurt?" (upper, mid or lower back)   Low Back Pain and her neck - She states this is where the cancer is  located .  3. SEVERITY: "How bad is the pain?"  (e.g., Scale 1-10; mild, moderate, or severe)     - MODERATE (4-7): Interferes with normal activities or awakens from sleep.   4. PATTERN: "Is the pain constant?" (e.g., yes, no; constant, intermittent)     Constant only at night when she is trying g to sleep  6. CAUSE:  "What do you think is causing the back pain?"       Newly diagnosed with Cancer . She was informed it in is in two place.  8. MEDICINES: "What have you taken so far for the pain?" (e.g., nothing, acetaminophen, NSAIDS)     Tylenol normal helps but not lately.  Protocols used: Back Pain-A-AH

## 2023-08-04 DIAGNOSIS — M8589 Other specified disorders of bone density and structure, multiple sites: Secondary | ICD-10-CM | POA: Diagnosis not present

## 2023-08-04 DIAGNOSIS — E559 Vitamin D deficiency, unspecified: Secondary | ICD-10-CM | POA: Diagnosis not present

## 2023-08-04 DIAGNOSIS — I10 Essential (primary) hypertension: Secondary | ICD-10-CM | POA: Diagnosis not present

## 2023-08-04 DIAGNOSIS — C7951 Secondary malignant neoplasm of bone: Secondary | ICD-10-CM | POA: Diagnosis not present

## 2023-08-04 DIAGNOSIS — R918 Other nonspecific abnormal finding of lung field: Secondary | ICD-10-CM | POA: Diagnosis not present

## 2023-08-13 DIAGNOSIS — C3490 Malignant neoplasm of unspecified part of unspecified bronchus or lung: Secondary | ICD-10-CM | POA: Diagnosis not present

## 2023-08-13 DIAGNOSIS — G319 Degenerative disease of nervous system, unspecified: Secondary | ICD-10-CM | POA: Diagnosis not present

## 2023-08-13 DIAGNOSIS — R918 Other nonspecific abnormal finding of lung field: Secondary | ICD-10-CM | POA: Diagnosis not present

## 2023-08-13 DIAGNOSIS — C7951 Secondary malignant neoplasm of bone: Secondary | ICD-10-CM | POA: Diagnosis not present

## 2023-08-16 ENCOUNTER — Ambulatory Visit: Payer: Medicare HMO | Admitting: Family Medicine

## 2023-09-02 DIAGNOSIS — M8958 Osteolysis, other site: Secondary | ICD-10-CM | POA: Diagnosis not present

## 2023-09-02 DIAGNOSIS — Z79899 Other long term (current) drug therapy: Secondary | ICD-10-CM | POA: Diagnosis not present

## 2023-09-02 DIAGNOSIS — C3490 Malignant neoplasm of unspecified part of unspecified bronchus or lung: Secondary | ICD-10-CM | POA: Diagnosis not present

## 2023-09-02 DIAGNOSIS — M8589 Other specified disorders of bone density and structure, multiple sites: Secondary | ICD-10-CM | POA: Diagnosis not present

## 2023-09-02 DIAGNOSIS — C7951 Secondary malignant neoplasm of bone: Secondary | ICD-10-CM | POA: Diagnosis not present

## 2023-09-02 DIAGNOSIS — K5730 Diverticulosis of large intestine without perforation or abscess without bleeding: Secondary | ICD-10-CM | POA: Diagnosis not present

## 2023-09-02 DIAGNOSIS — R918 Other nonspecific abnormal finding of lung field: Secondary | ICD-10-CM | POA: Diagnosis not present

## 2023-09-02 DIAGNOSIS — I10 Essential (primary) hypertension: Secondary | ICD-10-CM | POA: Diagnosis not present

## 2023-09-03 DIAGNOSIS — E559 Vitamin D deficiency, unspecified: Secondary | ICD-10-CM | POA: Diagnosis not present

## 2023-09-03 DIAGNOSIS — C7951 Secondary malignant neoplasm of bone: Secondary | ICD-10-CM | POA: Diagnosis not present

## 2023-09-15 DIAGNOSIS — M8589 Other specified disorders of bone density and structure, multiple sites: Secondary | ICD-10-CM | POA: Diagnosis not present

## 2023-09-15 DIAGNOSIS — E559 Vitamin D deficiency, unspecified: Secondary | ICD-10-CM | POA: Diagnosis not present

## 2023-09-15 DIAGNOSIS — R918 Other nonspecific abnormal finding of lung field: Secondary | ICD-10-CM | POA: Diagnosis not present

## 2023-09-15 DIAGNOSIS — I10 Essential (primary) hypertension: Secondary | ICD-10-CM | POA: Diagnosis not present

## 2023-09-15 DIAGNOSIS — C7951 Secondary malignant neoplasm of bone: Secondary | ICD-10-CM | POA: Diagnosis not present

## 2023-09-20 ENCOUNTER — Ambulatory Visit (INDEPENDENT_AMBULATORY_CARE_PROVIDER_SITE_OTHER): Payer: Medicare Other | Admitting: Family Medicine

## 2023-09-20 ENCOUNTER — Encounter: Payer: Self-pay | Admitting: Family Medicine

## 2023-09-20 VITALS — BP 128/82 | HR 79 | Ht 64.0 in | Wt 162.0 lb

## 2023-09-20 DIAGNOSIS — I10 Essential (primary) hypertension: Secondary | ICD-10-CM

## 2023-09-20 DIAGNOSIS — R7309 Other abnormal glucose: Secondary | ICD-10-CM | POA: Diagnosis not present

## 2023-09-20 LAB — POCT GLYCOSYLATED HEMOGLOBIN (HGB A1C): Hemoglobin A1C: 5.3 % (ref 4.0–5.6)

## 2023-09-20 MED ORDER — BENAZEPRIL HCL 20 MG PO TABS: 20.0000 mg | ORAL_TABLET | Freq: Every day | ORAL | 1 refills | Status: DC

## 2023-09-20 NOTE — Patient Instructions (Signed)
Keep up the walking and exercise it will definitely help with the blood pressure.  Keeping sodium to around 2000 mg/day or less.  Continue to monitor blood pressure here and there with a goal in the 120s or low 130s.

## 2023-09-20 NOTE — Progress Notes (Signed)
   Established Patient Office Visit  Subjective  Patient ID: Michelle Lawrence, female    DOB: [birth year]  Age: 78 y.o. MRN: [id number]  Chief Complaint  Patient presents with   Hypertension    HPI 34-month follow-up.  We had discontinued the HCTZ component of her blood pressure pill back in the fall.  She is here to follow-up on her blood pressure she is currently taking benazepril 20 mg daily.  She started a new infusion called send Bufford Buttner and started taking 2000 IU of vitamin D daily.  She has been tracking her blood pressures over the last week and a half.  Some of the blood pressures are in the 140s and 50s but some of them look pretty good in the 120s and 130s.  Felt achy with the Xomed but usually if she takes a Tylenol it does provide some relief.  And gives her flulike symptoms.    ROS    Objective:     BP 128/82   Pulse 79   Ht 5\' 4"  (1.626 m)   Wt 162 lb (73.5 kg)   SpO2 97%   BMI 27.81 kg/m    Physical Exam Vitals and nursing note reviewed.  Constitutional:      Appearance: Normal appearance.  HENT:     Head: Normocephalic and atraumatic.  Eyes:     Conjunctiva/sclera: Conjunctivae normal.  Cardiovascular:     Rate and Rhythm: Normal rate and regular rhythm.  Pulmonary:     Effort: Pulmonary effort is normal.     Breath sounds: Normal breath sounds.  Skin:    General: Skin is warm and dry.  Neurological:     Mental Status: She is alert.  Psychiatric:        Mood and Affect: Mood normal.      Results for orders placed or performed in visit on 09/20/23  POCT HgB A1C  Result Value Ref Range   Hemoglobin A1C 5.3 4.0 - 5.6 %   HbA1c POC (<> result, manual entry)     HbA1c, POC (prediabetic range)     HbA1c, POC (controlled diabetic range)        The 10-year ASCVD risk score (Arnett DK, et al., 2019) is: 25%    Assessment & Plan:   Problem List Items Addressed This Visit       Cardiovascular and Mediastinum   Essential hypertension,  benign - Primary   BP looks great here today. Has started walking again.  I do think this is probably helping.  She did have some blood pressures in the 120s on her sheet but most of them were higher than that.  She is gena give it a few more weeks of trying to walk regularly and will monitor it we do have some room to go up on the benazepril to 40 mg if needed.  We had taken the HCTZ component out.      Relevant Medications   benazepril (LOTENSIN) 20 MG tablet   Other Visit Diagnoses       Abnormal glucose       Relevant Orders   POCT HgB A1C (Completed)      A1c looked great today.  Continue to monitor. Return in about 6 months (around 03/19/2024) for Hypertension.    Nani Gasser, MD

## 2023-09-20 NOTE — Assessment & Plan Note (Addendum)
BP looks great here today. Has started walking again.  I do think this is probably helping.  She did have some blood pressures in the 120s on her sheet but most of them were higher than that.  She is gena give it a few more weeks of trying to walk regularly and will monitor it we do have some room to go up on the benazepril to 40 mg if needed.  We had taken the HCTZ component out.

## 2023-10-01 DIAGNOSIS — C7951 Secondary malignant neoplasm of bone: Secondary | ICD-10-CM | POA: Diagnosis not present

## 2023-10-01 DIAGNOSIS — M8589 Other specified disorders of bone density and structure, multiple sites: Secondary | ICD-10-CM | POA: Diagnosis not present

## 2023-10-01 DIAGNOSIS — E559 Vitamin D deficiency, unspecified: Secondary | ICD-10-CM | POA: Diagnosis not present

## 2023-10-01 DIAGNOSIS — I10 Essential (primary) hypertension: Secondary | ICD-10-CM | POA: Diagnosis not present

## 2023-10-01 DIAGNOSIS — R918 Other nonspecific abnormal finding of lung field: Secondary | ICD-10-CM | POA: Diagnosis not present

## 2023-10-04 ENCOUNTER — Telehealth: Payer: Self-pay

## 2023-10-04 NOTE — Telephone Encounter (Signed)
Patient came by office to drop off her BP readings, placed in Dr. Norman Herrlich box, thanks.

## 2023-10-05 NOTE — Telephone Encounter (Signed)
Reviewed home notes of her blood pressures.  Most of them running in the 130s and 140s.  Tracking from February 4 to February 17.  Highest blood pressure was 153/93.  Had asked if her cancer treatment on the some Bufford Buttner could increase her blood pressure but it looks like on the side effect profile it can lower blood pressure but not typically raise blood pressure.  I would encourage her to make sure that she is limiting salt intake to less than 2000 mg/day.  And we can always adjust her benazepril believes she is currently taking 20 mg daily.  She can take 2 of her current tabs and then track her blood pressures over the next 2 weeks and see if they are looking better.

## 2023-10-08 NOTE — Telephone Encounter (Signed)
 Pt advised of recommendations.

## 2023-10-08 NOTE — Telephone Encounter (Signed)
LVM advising her to rtn call to discuss treatment for bp

## 2023-10-12 DIAGNOSIS — I10 Essential (primary) hypertension: Secondary | ICD-10-CM | POA: Diagnosis not present

## 2023-10-12 DIAGNOSIS — R918 Other nonspecific abnormal finding of lung field: Secondary | ICD-10-CM | POA: Diagnosis not present

## 2023-10-12 DIAGNOSIS — Z7983 Long term (current) use of bisphosphonates: Secondary | ICD-10-CM | POA: Diagnosis not present

## 2023-10-12 DIAGNOSIS — Z5181 Encounter for therapeutic drug level monitoring: Secondary | ICD-10-CM | POA: Diagnosis not present

## 2023-10-12 DIAGNOSIS — Z780 Asymptomatic menopausal state: Secondary | ICD-10-CM | POA: Diagnosis not present

## 2023-10-12 DIAGNOSIS — C7951 Secondary malignant neoplasm of bone: Secondary | ICD-10-CM | POA: Diagnosis not present

## 2023-10-12 DIAGNOSIS — M8580 Other specified disorders of bone density and structure, unspecified site: Secondary | ICD-10-CM | POA: Diagnosis not present

## 2023-11-01 ENCOUNTER — Ambulatory Visit: Payer: Medicare HMO | Admitting: Family Medicine

## 2023-11-05 ENCOUNTER — Other Ambulatory Visit: Payer: Self-pay | Admitting: Family Medicine

## 2023-11-05 NOTE — Telephone Encounter (Signed)
 Copied from CRM [id number]. Topic: Clinical - Medication Refill >> Nov 05, 2023  9:30 AM Nyra Capes wrote: Patient called in, stating provider called patient to  increased medication to take 2 pills a day, and is running out of pills  Most Recent Primary Care Visit:  Provider: Nani Gasser D  Department: [facility]  Visit Type: OFFICE VISIT  Date: 09/20/2023  Medication: benazepril (LOTENSIN) 20 MG tablet  Has the patient contacted their pharmacy? No (Agent: If no, request that the patient contact the pharmacy for the refill. If patient does not wish to contact the pharmacy document the reason why and proceed with request.) (Agent: If yes, when and what did the pharmacy advise?)  Is this the correct pharmacy for this prescription? Yes If no, delete pharmacy and type the correct one.  This is the patient's preferred pharmacy:  [facility] [phone] - [place], [place] - [address] [place]  [place] [address] Phone: [phone] Fax: [phone]   Has the prescription been filled recently? Yes  Is the patient out of the medication? No patient has 10 days left  Has the patient been seen for an appointment in the last year OR does the patient have an upcoming appointment? Yes  Can we respond through MyChart? No  patient would like to have phone call phone # [phone] ok to leave detailed message  Agent: Please be advised that Rx refills may take up to 3 business days. We ask that you follow-up with your pharmacy.

## 2023-11-10 DIAGNOSIS — M8589 Other specified disorders of bone density and structure, multiple sites: Secondary | ICD-10-CM | POA: Diagnosis not present

## 2023-11-10 DIAGNOSIS — I10 Essential (primary) hypertension: Secondary | ICD-10-CM | POA: Diagnosis not present

## 2023-11-10 DIAGNOSIS — R918 Other nonspecific abnormal finding of lung field: Secondary | ICD-10-CM | POA: Diagnosis not present

## 2023-11-10 DIAGNOSIS — C7951 Secondary malignant neoplasm of bone: Secondary | ICD-10-CM | POA: Diagnosis not present

## 2023-11-10 DIAGNOSIS — E559 Vitamin D deficiency, unspecified: Secondary | ICD-10-CM | POA: Diagnosis not present

## 2023-11-10 DIAGNOSIS — C3491 Malignant neoplasm of unspecified part of right bronchus or lung: Secondary | ICD-10-CM | POA: Insufficient documentation

## 2023-11-11 DIAGNOSIS — I10 Essential (primary) hypertension: Secondary | ICD-10-CM | POA: Diagnosis not present

## 2023-11-11 DIAGNOSIS — R1084 Generalized abdominal pain: Secondary | ICD-10-CM | POA: Diagnosis not present

## 2023-11-11 DIAGNOSIS — R112 Nausea with vomiting, unspecified: Secondary | ICD-10-CM | POA: Diagnosis not present

## 2023-11-15 ENCOUNTER — Ambulatory Visit: Payer: Self-pay

## 2023-11-15 ENCOUNTER — Other Ambulatory Visit: Payer: Self-pay | Admitting: *Deleted

## 2023-11-15 DIAGNOSIS — I10 Essential (primary) hypertension: Secondary | ICD-10-CM

## 2023-11-15 MED ORDER — BENAZEPRIL HCL 40 MG PO TABS: 40.0000 mg | ORAL_TABLET | Freq: Every day | ORAL | 0 refills | Status: DC

## 2023-11-15 MED ORDER — BENAZEPRIL HCL 40 MG PO TABS
40.0000 mg | ORAL_TABLET | Freq: Every day | ORAL | 0 refills | Status: DC
Start: 2023-11-15 — End: 2023-11-15

## 2023-11-15 MED ORDER — BENAZEPRIL HCL 40 MG PO TABS
40.0000 mg | ORAL_TABLET | Freq: Every day | ORAL | 0 refills | Status: DC
Start: 2023-11-15 — End: 2024-02-09

## 2023-11-15 NOTE — Telephone Encounter (Signed)
 Pt was advised to do the following on 10/08/2023:  She can take 2 of her current tabs and then track her blood pressures over the next 2 weeks and see if they are looking better.   Called pt to get more information regarding her bp she stated that her readings are down. She said that when she was seen at another office her reading was good.   Benazepril changed from 20 mg to 40 mg pt to take 1 tablet daily

## 2023-11-15 NOTE — Telephone Encounter (Signed)
  Chief Complaint: Medication Question  Additional Notes:  Patient reports Dr. Linford Arnold told her " a few months ago" to begin taking 2 tablets of her 20mg  Lotensin a day. Order states to only take 1 tablet/day. Patient reports this was told to her by Dr. Linford Arnold over the phone due to "blood pressure issues" and states that is why she has run out of her meds so quickly. Patient states that she only has a few days left of her Lotensin and that her refill requests keep being denied due to stating she only takes 1 tablet when she really takes 2 tablets. Please advise on correct order and refill if appropriate. Patient requesting a call back from PCP.    Copied from CRM [id number]. Topic: Clinical - Medication Question >> Nov 15, 2023  7:58 AM Everette C wrote: Reason for CRM: The patient would like to be contacted by a member of clinical staff to discuss the directions and quantity of their prescription for benazepril (LOTENSIN) 20 MG tablet [[id number]]  Please contact further when possible Reason for Disposition  [1] Caller has NON-URGENT medicine question about med that PCP prescribed AND [2] triager unable to answer question  Answer Assessment - Initial Assessment Questions 1. NAME of MEDICINE: "What medicine(s) are you calling about?"     Lotensin Patient reports Dr. Linford Arnold told her " a few months ago" to begin taking 2 tablets of her 20mg  Lotensin a day. Order states to only take 1 tablet/day. Patient reports this was told to her by Dr. Linford Arnold over the phone and states that is why she has run out of her meds so quickly. Patient states that she only has a few days left of her Lotensin and that her refill requests keep being denied due to stating she only takes 1 tablet when she really takes 2 tablets. Please advise on correct order and refill if appropriate. Patient requesting a call back from PCP.  Protocols used: Medication Question Call-A-AH

## 2023-11-15 NOTE — Addendum Note (Signed)
 Addended by: Deno Etienne on: 11/15/2023 04:40 PM   Modules accepted: Orders

## 2023-11-20 DIAGNOSIS — C3491 Malignant neoplasm of unspecified part of right bronchus or lung: Secondary | ICD-10-CM | POA: Diagnosis not present

## 2023-11-20 DIAGNOSIS — C7951 Secondary malignant neoplasm of bone: Secondary | ICD-10-CM | POA: Diagnosis not present

## 2023-11-20 DIAGNOSIS — E559 Vitamin D deficiency, unspecified: Secondary | ICD-10-CM | POA: Diagnosis not present

## 2023-11-23 DIAGNOSIS — C7971 Secondary malignant neoplasm of right adrenal gland: Secondary | ICD-10-CM | POA: Diagnosis not present

## 2023-11-23 DIAGNOSIS — C3401 Malignant neoplasm of right main bronchus: Secondary | ICD-10-CM | POA: Diagnosis not present

## 2023-11-23 DIAGNOSIS — C7889 Secondary malignant neoplasm of other digestive organs: Secondary | ICD-10-CM | POA: Diagnosis not present

## 2023-11-23 DIAGNOSIS — C801 Malignant (primary) neoplasm, unspecified: Secondary | ICD-10-CM | POA: Diagnosis not present

## 2023-11-23 DIAGNOSIS — C7981 Secondary malignant neoplasm of breast: Secondary | ICD-10-CM | POA: Diagnosis not present

## 2023-11-23 DIAGNOSIS — C7989 Secondary malignant neoplasm of other specified sites: Secondary | ICD-10-CM | POA: Diagnosis not present

## 2023-11-23 DIAGNOSIS — C787 Secondary malignant neoplasm of liver and intrahepatic bile duct: Secondary | ICD-10-CM | POA: Diagnosis not present

## 2023-11-23 DIAGNOSIS — C7951 Secondary malignant neoplasm of bone: Secondary | ICD-10-CM | POA: Diagnosis not present

## 2023-11-23 DIAGNOSIS — C7972 Secondary malignant neoplasm of left adrenal gland: Secondary | ICD-10-CM | POA: Diagnosis not present

## 2023-11-23 DIAGNOSIS — R918 Other nonspecific abnormal finding of lung field: Secondary | ICD-10-CM | POA: Diagnosis not present

## 2023-11-23 DIAGNOSIS — C771 Secondary and unspecified malignant neoplasm of intrathoracic lymph nodes: Secondary | ICD-10-CM | POA: Diagnosis not present

## 2023-12-06 DIAGNOSIS — K259 Gastric ulcer, unspecified as acute or chronic, without hemorrhage or perforation: Secondary | ICD-10-CM | POA: Diagnosis not present

## 2023-12-06 DIAGNOSIS — K3189 Other diseases of stomach and duodenum: Secondary | ICD-10-CM | POA: Diagnosis not present

## 2023-12-06 DIAGNOSIS — K2950 Unspecified chronic gastritis without bleeding: Secondary | ICD-10-CM | POA: Diagnosis not present

## 2023-12-06 DIAGNOSIS — R110 Nausea: Secondary | ICD-10-CM | POA: Diagnosis not present

## 2023-12-08 DIAGNOSIS — D649 Anemia, unspecified: Secondary | ICD-10-CM | POA: Diagnosis not present

## 2023-12-08 DIAGNOSIS — I10 Essential (primary) hypertension: Secondary | ICD-10-CM | POA: Diagnosis not present

## 2023-12-08 DIAGNOSIS — C3491 Malignant neoplasm of unspecified part of right bronchus or lung: Secondary | ICD-10-CM | POA: Diagnosis not present

## 2023-12-08 DIAGNOSIS — C7951 Secondary malignant neoplasm of bone: Secondary | ICD-10-CM | POA: Diagnosis not present

## 2023-12-08 DIAGNOSIS — E876 Hypokalemia: Secondary | ICD-10-CM | POA: Diagnosis not present

## 2023-12-08 DIAGNOSIS — K117 Disturbances of salivary secretion: Secondary | ICD-10-CM | POA: Diagnosis not present

## 2023-12-08 DIAGNOSIS — E559 Vitamin D deficiency, unspecified: Secondary | ICD-10-CM | POA: Diagnosis not present

## 2023-12-13 DIAGNOSIS — Z87891 Personal history of nicotine dependence: Secondary | ICD-10-CM | POA: Diagnosis not present

## 2023-12-13 DIAGNOSIS — C7931 Secondary malignant neoplasm of brain: Secondary | ICD-10-CM | POA: Diagnosis not present

## 2023-12-13 DIAGNOSIS — C3411 Malignant neoplasm of upper lobe, right bronchus or lung: Secondary | ICD-10-CM | POA: Diagnosis not present

## 2023-12-13 DIAGNOSIS — C3491 Malignant neoplasm of unspecified part of right bronchus or lung: Secondary | ICD-10-CM | POA: Diagnosis not present

## 2023-12-13 DIAGNOSIS — C7951 Secondary malignant neoplasm of bone: Secondary | ICD-10-CM | POA: Diagnosis not present

## 2023-12-14 DIAGNOSIS — Z87891 Personal history of nicotine dependence: Secondary | ICD-10-CM | POA: Diagnosis not present

## 2023-12-14 DIAGNOSIS — C7951 Secondary malignant neoplasm of bone: Secondary | ICD-10-CM | POA: Diagnosis not present

## 2023-12-14 DIAGNOSIS — C3411 Malignant neoplasm of upper lobe, right bronchus or lung: Secondary | ICD-10-CM | POA: Diagnosis not present

## 2023-12-16 DIAGNOSIS — C3411 Malignant neoplasm of upper lobe, right bronchus or lung: Secondary | ICD-10-CM | POA: Diagnosis not present

## 2023-12-16 DIAGNOSIS — Z510 Encounter for antineoplastic radiation therapy: Secondary | ICD-10-CM | POA: Diagnosis not present

## 2023-12-16 DIAGNOSIS — C7951 Secondary malignant neoplasm of bone: Secondary | ICD-10-CM | POA: Diagnosis not present

## 2023-12-16 DIAGNOSIS — C3491 Malignant neoplasm of unspecified part of right bronchus or lung: Secondary | ICD-10-CM | POA: Diagnosis not present

## 2023-12-16 DIAGNOSIS — Z87891 Personal history of nicotine dependence: Secondary | ICD-10-CM | POA: Diagnosis not present

## 2023-12-20 DIAGNOSIS — C3491 Malignant neoplasm of unspecified part of right bronchus or lung: Secondary | ICD-10-CM | POA: Diagnosis not present

## 2023-12-23 DIAGNOSIS — Z87891 Personal history of nicotine dependence: Secondary | ICD-10-CM | POA: Diagnosis not present

## 2023-12-23 DIAGNOSIS — C3491 Malignant neoplasm of unspecified part of right bronchus or lung: Secondary | ICD-10-CM | POA: Diagnosis not present

## 2023-12-23 DIAGNOSIS — C3411 Malignant neoplasm of upper lobe, right bronchus or lung: Secondary | ICD-10-CM | POA: Diagnosis not present

## 2023-12-23 DIAGNOSIS — C7951 Secondary malignant neoplasm of bone: Secondary | ICD-10-CM | POA: Diagnosis not present

## 2023-12-23 DIAGNOSIS — Z510 Encounter for antineoplastic radiation therapy: Secondary | ICD-10-CM | POA: Diagnosis not present

## 2023-12-30 DIAGNOSIS — Z510 Encounter for antineoplastic radiation therapy: Secondary | ICD-10-CM | POA: Diagnosis not present

## 2023-12-30 DIAGNOSIS — C3491 Malignant neoplasm of unspecified part of right bronchus or lung: Secondary | ICD-10-CM | POA: Diagnosis not present

## 2023-12-30 DIAGNOSIS — C3411 Malignant neoplasm of upper lobe, right bronchus or lung: Secondary | ICD-10-CM | POA: Diagnosis not present

## 2023-12-30 DIAGNOSIS — Z87891 Personal history of nicotine dependence: Secondary | ICD-10-CM | POA: Diagnosis not present

## 2023-12-30 DIAGNOSIS — C7951 Secondary malignant neoplasm of bone: Secondary | ICD-10-CM | POA: Diagnosis not present

## 2023-12-31 DIAGNOSIS — Z510 Encounter for antineoplastic radiation therapy: Secondary | ICD-10-CM | POA: Diagnosis not present

## 2023-12-31 DIAGNOSIS — C3491 Malignant neoplasm of unspecified part of right bronchus or lung: Secondary | ICD-10-CM | POA: Diagnosis not present

## 2023-12-31 DIAGNOSIS — C7951 Secondary malignant neoplasm of bone: Secondary | ICD-10-CM | POA: Diagnosis not present

## 2024-01-03 DIAGNOSIS — C7951 Secondary malignant neoplasm of bone: Secondary | ICD-10-CM | POA: Diagnosis not present

## 2024-01-03 DIAGNOSIS — C3491 Malignant neoplasm of unspecified part of right bronchus or lung: Secondary | ICD-10-CM | POA: Diagnosis not present

## 2024-01-03 DIAGNOSIS — Z510 Encounter for antineoplastic radiation therapy: Secondary | ICD-10-CM | POA: Diagnosis not present

## 2024-01-04 DIAGNOSIS — Z510 Encounter for antineoplastic radiation therapy: Secondary | ICD-10-CM | POA: Diagnosis not present

## 2024-01-04 DIAGNOSIS — C3491 Malignant neoplasm of unspecified part of right bronchus or lung: Secondary | ICD-10-CM | POA: Diagnosis not present

## 2024-01-04 DIAGNOSIS — C7951 Secondary malignant neoplasm of bone: Secondary | ICD-10-CM | POA: Diagnosis not present

## 2024-01-05 DIAGNOSIS — C3411 Malignant neoplasm of upper lobe, right bronchus or lung: Secondary | ICD-10-CM | POA: Diagnosis not present

## 2024-01-05 DIAGNOSIS — C7951 Secondary malignant neoplasm of bone: Secondary | ICD-10-CM | POA: Diagnosis not present

## 2024-01-05 DIAGNOSIS — Z510 Encounter for antineoplastic radiation therapy: Secondary | ICD-10-CM | POA: Diagnosis not present

## 2024-01-05 DIAGNOSIS — M8589 Other specified disorders of bone density and structure, multiple sites: Secondary | ICD-10-CM | POA: Diagnosis not present

## 2024-01-05 DIAGNOSIS — I10 Essential (primary) hypertension: Secondary | ICD-10-CM | POA: Diagnosis not present

## 2024-01-05 DIAGNOSIS — E559 Vitamin D deficiency, unspecified: Secondary | ICD-10-CM | POA: Diagnosis not present

## 2024-01-05 DIAGNOSIS — C3491 Malignant neoplasm of unspecified part of right bronchus or lung: Secondary | ICD-10-CM | POA: Diagnosis not present

## 2024-01-05 DIAGNOSIS — Z87891 Personal history of nicotine dependence: Secondary | ICD-10-CM | POA: Diagnosis not present

## 2024-01-06 DIAGNOSIS — Z510 Encounter for antineoplastic radiation therapy: Secondary | ICD-10-CM | POA: Diagnosis not present

## 2024-01-06 DIAGNOSIS — C3491 Malignant neoplasm of unspecified part of right bronchus or lung: Secondary | ICD-10-CM | POA: Diagnosis not present

## 2024-01-06 DIAGNOSIS — C7951 Secondary malignant neoplasm of bone: Secondary | ICD-10-CM | POA: Diagnosis not present

## 2024-01-07 DIAGNOSIS — C3491 Malignant neoplasm of unspecified part of right bronchus or lung: Secondary | ICD-10-CM | POA: Diagnosis not present

## 2024-01-07 DIAGNOSIS — Z510 Encounter for antineoplastic radiation therapy: Secondary | ICD-10-CM | POA: Diagnosis not present

## 2024-01-07 DIAGNOSIS — C7951 Secondary malignant neoplasm of bone: Secondary | ICD-10-CM | POA: Diagnosis not present

## 2024-01-11 DIAGNOSIS — C3491 Malignant neoplasm of unspecified part of right bronchus or lung: Secondary | ICD-10-CM | POA: Diagnosis not present

## 2024-01-11 DIAGNOSIS — C7951 Secondary malignant neoplasm of bone: Secondary | ICD-10-CM | POA: Diagnosis not present

## 2024-01-11 DIAGNOSIS — Z510 Encounter for antineoplastic radiation therapy: Secondary | ICD-10-CM | POA: Diagnosis not present

## 2024-01-12 DIAGNOSIS — C3491 Malignant neoplasm of unspecified part of right bronchus or lung: Secondary | ICD-10-CM | POA: Diagnosis not present

## 2024-01-12 DIAGNOSIS — C7951 Secondary malignant neoplasm of bone: Secondary | ICD-10-CM | POA: Diagnosis not present

## 2024-01-12 DIAGNOSIS — Z510 Encounter for antineoplastic radiation therapy: Secondary | ICD-10-CM | POA: Diagnosis not present

## 2024-01-13 DIAGNOSIS — C3491 Malignant neoplasm of unspecified part of right bronchus or lung: Secondary | ICD-10-CM | POA: Diagnosis not present

## 2024-01-13 DIAGNOSIS — Z510 Encounter for antineoplastic radiation therapy: Secondary | ICD-10-CM | POA: Diagnosis not present

## 2024-01-13 DIAGNOSIS — C7951 Secondary malignant neoplasm of bone: Secondary | ICD-10-CM | POA: Diagnosis not present

## 2024-01-13 DIAGNOSIS — C3411 Malignant neoplasm of upper lobe, right bronchus or lung: Secondary | ICD-10-CM | POA: Diagnosis not present

## 2024-01-13 DIAGNOSIS — Z87891 Personal history of nicotine dependence: Secondary | ICD-10-CM | POA: Diagnosis not present

## 2024-01-15 ENCOUNTER — Ambulatory Visit
Admission: EM | Admit: 2024-01-15 | Discharge: 2024-01-15 | Disposition: A | Discharge status: Home / Self Care | Attending: Family Medicine | Admitting: Family Medicine

## 2024-01-15 ENCOUNTER — Other Ambulatory Visit: Payer: Self-pay

## 2024-01-15 ENCOUNTER — Encounter: Payer: Self-pay | Admitting: Family Medicine

## 2024-01-15 DIAGNOSIS — R300 Dysuria: Secondary | ICD-10-CM

## 2024-01-15 DIAGNOSIS — J309 Allergic rhinitis, unspecified: Secondary | ICD-10-CM | POA: Diagnosis not present

## 2024-01-15 LAB — POCT URINALYSIS DIP (MANUAL ENTRY)
Bilirubin, UA: NEGATIVE
Blood, UA: NEGATIVE
Glucose, UA: NEGATIVE mg/dL
Ketones, POC UA: NEGATIVE mg/dL
Leukocytes, UA: NEGATIVE
Nitrite, UA: NEGATIVE
Protein Ur, POC: 100 mg/dL — AB
Spec Grav, UA: >=1.03 — AB (ref 1.010–1.025)
Urobilinogen, UA: 0.2 U/dL
pH, UA: 5.5 (ref 5.0–8.0)

## 2024-01-15 NOTE — Discharge Instructions (Addendum)
 Advised patient UA was negative.  Advised may take OTC Allegra 180 mg fexofenadine daily x 5 days, as needed for concurrent postnasal drainage/drip/runny nose/sneezing/congestion.  Encouraged to increase daily water intake to 64 ounces per day while taking this medication.  Advised if symptoms worsen and/or unresolved please follow-up with your PCP or here for further evaluation.

## 2024-01-15 NOTE — ED Triage Notes (Signed)
 Pt c/o painful urination since yesterday. Denies back pain and fever.

## 2024-01-15 NOTE — ED Provider Notes (Signed)
 [facility]    CSN: [id number] Arrival date & time: 01/15/24  1553      History   Chief Complaint Chief Complaint  Patient presents with   Dysuria    HPI Michelle Lawrence is a 78 y.o. female.   HPI Pleasant 78 year old female presents with dysuria and concern for possible UTI since yesterday.  Additionally patient reports runny nose.  PMH significant for endometrial cancer, HLD, and HTN.  Past Medical History:  Diagnosis Date   Cancer (HCC) 1981   enfometrial or cervical?   Hyperlipidemia    Hypertension     Patient Active Problem List   Diagnosis Date Noted   Caregiver stress 02/09/2023   Palpitations 02/22/2020   Dyshidrotic hand dermatitis 11/09/2019   Nocturia 11/09/2019   Osteopenia 12/17/2010   Vitamin D  deficiency 10/24/2010   Hyperlipemia 10/24/2010   Essential hypertension, benign 10/24/2010   EDEMA 10/24/2010   Asymptomatic postmenopausal status 10/24/2010    Past Surgical History:  Procedure Laterality Date   LAPAROSCOPIC CHOLECYSTECTOMY  2004   TOTAL ABDOMINAL HYSTERECTOMY  1981   w/ BSO for cancerous reasons    OB History   No obstetric history on file.      Home Medications    Prior to Admission medications   Medication Sig Start Date End Date Taking? Authorizing Provider  benazepril  (LOTENSIN ) 40 MG tablet Take 1 tablet (40 mg total) by mouth daily. 11/15/23   Cydney Draft, MD  Calcium  Citrate-Vitamin D  (CITRACAL + D PO) Take by mouth daily.     [provider]  rosuvastatin  (CRESTOR ) 10 MG tablet TAKE 1 TABLET BY MOUTH EVERY DAY 06/28/23   Cydney Draft, MD  vitamin B-12 (CYANOCOBALAMIN) 500 MCG tablet Take 500 mcg by mouth every other day.    [provider]    Family History Family History  Problem Relation Age of Onset   Other Mother 23       CHF   Lung cancer Father        55   Cancer Sister        liver   Breast cancer Sister    Lung cancer Brother        exposed to agent  orange.    Hyperlipidemia Sister    Cancer Sister        stomach   Hypertension Daughter 27   Hyperlipidemia Maternal Uncle     Social History Social History   Tobacco Use   Smoking status: Never   Smokeless tobacco: Never  Vaping Use   Vaping status: Never Used  Substance Use Topics   Alcohol use: No   Drug use: No     Allergies   Bactrim  [sulfamethoxazole -trimethoprim ], Codeine, Hydrochlorothiazide , Latex, Macrobid  [nitrofurantoin ], and Zoloft  [sertraline  hcl]   Review of Systems Review of Systems   Physical Exam Triage Vital Signs ED Triage Vitals  Encounter Vitals Group     BP      Systolic BP Percentile      Diastolic BP Percentile      Pulse      Resp      Temp      Temp src      SpO2      Weight      Height      Head Circumference      Peak Flow      Pain Score      Pain Loc      Pain Education  Exclude from Growth Chart    No data found.  Updated Vital Signs BP (!) 153/68 (BP Location: Right Arm)   Pulse 94   Temp 98.1 F (36.7 C) (Oral)   SpO2 94%   Visual Acuity Right Eye Distance:   Left Eye Distance:   Bilateral Distance:    Right Eye Near:   Left Eye Near:    Bilateral Near:     Physical Exam Vitals and nursing note reviewed.  Constitutional:      Appearance: Normal appearance. She is normal weight.  HENT:     Head: Normocephalic and atraumatic.     Mouth/Throat:     Mouth: Mucous membranes are moist.     Pharynx: Oropharynx is clear.     Comments: Mild clear drainage of posterior oropharynx noted Eyes:     Extraocular Movements: Extraocular movements intact.     Conjunctiva/sclera: Conjunctivae normal.     Pupils: Pupils are equal, round, and reactive to light.  Cardiovascular:     Rate and Rhythm: Normal rate and regular rhythm.     Pulses: Normal pulses.     Heart sounds: Normal heart sounds.  Pulmonary:     Effort: Pulmonary effort is normal.     Breath sounds: Normal breath sounds. No wheezing, rhonchi or  rales.  Musculoskeletal:        General: Normal range of motion.     Cervical back: Normal range of motion and neck supple.  Skin:    General: Skin is warm and dry.  Neurological:     General: No focal deficit present.     Mental Status: She is alert and oriented to person, place, and time. Mental status is at baseline.  Psychiatric:        Mood and Affect: Mood normal.        Behavior: Behavior normal.      UC Treatments / Results  Labs (all labs ordered are listed, but only abnormal results are displayed) Labs Reviewed  POCT URINALYSIS DIP (MANUAL ENTRY) - Abnormal; Notable for the following components:      Result Value   Spec Grav, UA >=1.030 (*)    Protein Ur, POC =100 (*)    All other components within normal limits    EKG   Radiology No results found.  Procedures Procedures (including critical care time)  Medications Ordered in UC Medications - No data to display  Initial Impression / Assessment and Plan / UC Course  I have reviewed the triage vital signs and the nursing notes.  Pertinent labs & imaging results that were available during my care of the patient were reviewed by me and considered in my medical decision making (see chart for details).     MDM: 1.  Dysuria-UA revealed above; 2.  Allergic rhinitis, unspecified seasonality, unspecified trigger-Advised patient UA was negative.  Advised may take OTC Allegra 180 mg fexofenadine daily x 5 days, as needed for concurrent postnasal drainage/drip/runny nose/sneezing/congestion.  Encouraged to increase daily water intake to 64 ounces per day while taking this medication.  Advised if symptoms worsen and/or unresolved please follow-up with your PCP or here for further evaluation.  Patient discharged home, hemodynamically stable. Final Clinical Impressions(s) / UC Diagnoses   Final diagnoses:  Dysuria  Allergic rhinitis, unspecified seasonality, unspecified trigger     Discharge Instructions       Advised patient UA was negative.  Advised may take OTC Allegra 180 mg fexofenadine daily x 5 days, as needed for concurrent  postnasal drainage/drip/runny nose/sneezing/congestion.  Encouraged to increase daily water intake to 64 ounces per day while taking this medication.  Advised if symptoms worsen and/or unresolved please follow-up with your PCP or here for further evaluation.   ED Prescriptions   None    PDMP not reviewed this encounter.   Leonides Ramp, FNP 01/15/24 [phone]

## 2024-02-03 DIAGNOSIS — N6312 Unspecified lump in the right breast, upper inner quadrant: Secondary | ICD-10-CM | POA: Diagnosis not present

## 2024-02-03 DIAGNOSIS — I10 Essential (primary) hypertension: Secondary | ICD-10-CM | POA: Diagnosis not present

## 2024-02-03 DIAGNOSIS — E559 Vitamin D deficiency, unspecified: Secondary | ICD-10-CM | POA: Diagnosis not present

## 2024-02-03 DIAGNOSIS — C3491 Malignant neoplasm of unspecified part of right bronchus or lung: Secondary | ICD-10-CM | POA: Diagnosis not present

## 2024-02-03 DIAGNOSIS — M8589 Other specified disorders of bone density and structure, multiple sites: Secondary | ICD-10-CM | POA: Diagnosis not present

## 2024-02-03 DIAGNOSIS — C7951 Secondary malignant neoplasm of bone: Secondary | ICD-10-CM | POA: Diagnosis not present

## 2024-02-03 NOTE — Progress Notes (Signed)
 Adult Non-Chemo Infusion Note Zometa     Infusion Assessment  Pt here to receive Zometa .   Blood return was able to be verified from peripheral IV.    Refer to [facility] for infusion administration times.  Patient tolerated infusion without complications.  Blood return verified after infusion completed.   IV Flushed  IV line flushed per protocol after infusion completed.   Post-Infusion Instructions  Patient was instructed to notify medical team if they experience any intolerable infusio side effects.   Pt alert, oriented, and self ambulatory at time of discharge.

## 2024-02-03 NOTE — Progress Notes (Signed)
 Subjective   Chief Complaint:   Michelle Lawrence is a 78 y.o. female  who returns for follow up of lung cancer  History of Present Illness: Chaperone offered and declined Today, I had the pleasure of seeing the patient for further evaluation of her lung cancer undergoing treatment with Tagrisso.  The patient is in a cycle of alternating constipation and diarrhea utilizing MiraLAX and Imodium.  I have advised her to not take Imodium unless she has more than 3 watery stools in 24 hours and to use her MiraLAX to average 1-2 bowel movements daily.  She occasionally requires nausea medicine for her Tagrisso and thinks that exacerbates her constipation.  She completed her radiation on May 29.  She occasionally gets a dry cough at night when she coughs very hard she thinks she may see a fleck or 2 of blood.  She has noted a mass in her upper inner quadrant of her right breast sometime this year.  I looked back on my notes and it was not present when I examined her originally in November.  It was not commented upon on her CT chest done 06/07/2023.  I have ordered a diagnostic mammogram.  Her blood pressure is adequately controlled at 120/70 and her weight is 138 with a BMI of 23.7.  She is not complaining of shortness of breath or chest pain.  She is complaining of intermittent fatigue  Oncology History  Mass of upper lobe of right lung  07/08/2023 Initial Diagnosis   Mass of upper lobe of right lung 04/02/2023 chest x-ray suggested a right upper lobe pneumonia 05/07/2023 chest x-ray showed an increase in the right upper lobe process 06/07/2023 CT chest revealed a right upper lobe lung mass abutting the right hilum and an indeterminate right lower lobe nodule with no mediastinal adenopathy  04/28/2023 DEXA scan showed osteopenia  Mammogram 06/30/2023 negative    Cancer, metastatic to bone (*)  07/20/2023 Initial Diagnosis   Cancer, metastatic to bone (*)   08/05/2023 - 08/05/2023 Supportive Care    OP SUPPORTIVE DENOSUMAB SQ EVERY 28 DAYS - PREVENTION OF SKELETAL-RELATED EVENTS IN BONE METASTASES Plan Provider: Rudell MALVA Bud, MD Treatment goal: Supportive Line of treatment: [No plan line of treatment]   09/15/2023 -  Supportive Care   OP Supportive Zoledronic  Acid every 4 weeks for Multiple Myeloma and Bone Metastases Plan Provider: Rudell MALVA Bud, MD Treatment goal: Supportive Line of treatment: [No plan line of treatment]   12/06/2023 -  Chemotherapy   osimertinib mesylate (TAGRISSO) 80 mg TABS tablet, 80 mg, Oral, Daily, 1 of 6 cycles      12/30/2023 - 01/13/2024 Radiation Oncology   Treated to a dose of 30Gy in 10 fractions to the right hip, pelvis, and proximal femur.     Adenocarcinoma of right lung (*)  11/10/2023 Initial Diagnosis   Adenocarcinoma of right lung  (*)   11/10/2023 Cancer Staged   Staging form: Lung, AJCC V9 - Clinical: cT3, cN0, cM1 - Signed by Rudell MALVA Bud, MD on 11/10/2023    12/06/2023 -  Chemotherapy   osimertinib mesylate (TAGRISSO) 80 mg TABS tablet, 80 mg, Oral, Daily, 1 of 6 cycles        Cancer Staging <redacted file path>  Adenocarcinoma of right lung (*) Staging form: Lung, AJCC V9 - Clinical: cT3, cN0, cM1 - Signed by Rudell MALVA Bud, MD on 11/10/2023   Allergies, Social History, and Family History were reviewed and updated.    Review of Systems:  All systems were reviewed and are negative except as noted in HPI.  Objective   Physical Exam:  Vital Signs: BP 120/70 (BP Location: Right Upper Arm, Patient Position: Sitting)   Pulse 101   Temp 97.1 F (36.2 C) (Temporal)   Resp 16   Ht 5' 4 (1.626 m)   Wt 138 lb (62.6 kg)   SpO2 97%   BMI 23.69 kg/m Pain Score: 0-No pain Pain Intervention: Not indicated Falls: None Oral drug compliance: 100%  Intent of chemotherapy: Supportive  ECOG:  (0) Fully active, able to carry on all predisease performance without restriction Constitutional: The patient is in no apparent  distress, alert oriented, able to speak in full sentences, and well-nourished.  She appears her stated age Eyes: Conjunctivas and lids are pink and moist.  Pupils are equal and normally reactive to light.  Extraocular movements are intact. No scleral icterus. Ears, Nose mouth and throat: Oropharynx is without oral ulceration or exudate.  Nodes: There are no palpable nodes in thesubmental, pre-and postauricular,  cervical, supraclavicular, axillary,  femoral or inguinal regions. Skin: There are no abnormal nevi, ecchymoses, petechiae, or rashes. Chest: Lungs are clear to auscultation bilaterally.  No wheezes, rales, or rhonchi. Breasts: The right breast is remarkable for freely movable 2 cm mass in the upper inner quadrant without axillary adenopathy.  The left breast is without mass discharge or skin change Heart: Regular rate and rhythm.  No murmur, rub or gallop. Abdomen: Soft, non-tender, non distended.  No hepatosplenomegaly, No guarding or masses.  Positive bowel sounds. Musculoskeletal: There is no edema, clubbing or cyanosis. Neurologic: Cranial nerves II-XII are grossly intact and symmetric.  No focal motor or sensory deficits. Gait and speech are normal and fully oriented.       Accessory Clinical Data:  All pertinent labs/imaging reviewed and discussed with patient.  Recent Results (from the past 24 hours)  Comprehensive Metabolic Panel   Collection Time: 02/03/24  8:41 AM  Result Value Ref Range   Na 136 128 - 145 mmol/L   Potassium 4.0 3.6 - 5.1 mmol/L   Cl 106 98 - 108 mmol/L   CO2 29 18 - 33 mmol/L   AGAP 1 mmol/L   Glucose 113 73 - 118 mg/dL   BUN 9 7 - 22 mg/dL   Creatinine 9.39 9.39 - 1.20 mg/dL   Ca 8.5 8.0 - 89.6 mg/dL   ALK PHOS 80 42 - 858 U/L   T Bili 0.7 0.2 - 1.6 mg/dL   Total Protein 7.1 6.4 - 8.1 gm/dL   Alb 3.0 (L) 3.3 - 5.5 gm/dL   GLOBULIN 4.1 gm/dL   ALBUMIN/GLOBULIN RATIO 0.7    BUN/CREAT RATIO 15.0    ALT 10 10 - 47 U/L   AST 15 11 - 38 U/L    eGFR 92 >=59 mL/min/1.33m2  CBC And Differential   Collection Time: 02/03/24  8:41 AM  Result Value Ref Range   WBC 6.0 3.6 - 11.1 thou/mcL   RBC 3.73 3.69 - 4.88 million/mcL   HGB 9.5 (L) 11.4 - 14.4 gm/dL   HCT 70.5 (L) 66.6 - 58.5 %   MCV 78.8 (L) 79.0 - 95.0 fL   MCH 25.5 (L) 26.8 - 33.2 pg   MCHC 32.3 (L) 33.5 - 35.5 gm/dL   Plt Ct 727 834 - 646 thou/mcL   RDW SD 41.3 fL   RDW CV 15.6 (H) 12.0 - 15.1 %   MPV 8.2 7.5 - 10.7 fL  NEUTROPHIL % 74.4 (H) 43.3-71.9 % %   LYMPHOCYTE % 10.2 (L) 16.8-43.5 % %   MONOCYTE % 13.6 (H) 4.5 - 12.4 %   Eosinophil % 1.5 0.7 - 7.8 %   BASOPHIL % 0.3 0.0 - 1.1 %   ABSOLUTE NEUTROPHIL COUNT 4.44 1.90 - 7.20 thou/mcL   ABSOLUTE LYMPHOCYTE COUNT 0.61 (L) 1.10 - 2.70 thou/mcL   Absolute Monocyte Count 0.81 (H) 0.00 - 0.80 thou/mcL   Absolute Eosinophil Count 0.09 0.00 - 0.50 thou/mcL   Absolute Basophil Count 0.02 0.00 - 0.10 thou/mcL    Impression   Encounter Diagnoses  Code Name Primary?  . C79.51 Cancer, metastatic to bone (*)   . C34.91 Adenocarcinoma of right lung (*)   . I10 Essential hypertension, benign   . M85.89 Osteopenia of multiple sites   . E55.9 Vitamin D  deficiency   . W36.87 Mass of upper inner quadrant of right breast     Plan  1. Cancer, metastatic to bone (*) -     Comprehensive metabolic panel 2. Adenocarcinoma of right lung (*) -     Comprehensive Metabolic Panel -     CBC And Differential 3. Essential hypertension, benign 4. Osteopenia of multiple sites 5. Vitamin D  deficiency 6. Mass of upper inner quadrant of right breast -     Mammo 3D Tomo Diagnostic Bilateral; Future; Expected date: 02/03/2024 .  The patient will receive her sixth cycle of Zometa  today which she is tolerating without osteonecrosis of the jaw or symptomatic hypocalcemia.  She will continue her Tagrisso which she is tolerating fairly well other than intermittent diarrhea and fatigue.  She will continue her blood pressure control with her  primary care provider and it was well-controlled today 120/70.  Her osteopenia is being treated with her bone metastases.  She will continue her vitamin D3 to maintain a vitamin D  level between 40 and 50 to maximize bone and muscle health.  She have a diagnostic mammogram and will likely require biopsy.  It is possible that she has 2 primaries.  It is also possible that she could have metastatic lung cancer to breast but that occurs rarely.  Her CBC today is remarkable for hemoglobin of 9.5 and hematocrit of 29.4 and MCV of 78.8.  We will add iron studies today if we can and if not we will repeat them in we see her in 4 weeks.  She has a lymphopenia as expected after her radiation therapy at 610 and a mild monocytosis at 810 but down from 930 on 12/08/2023.  Patient voiced understanding and is in agreement with the plan.  All questions were discussed.   Rudell MALVA Bud, MD 02/03/2024 / 9:06 AM

## 2024-02-04 DIAGNOSIS — D509 Iron deficiency anemia, unspecified: Secondary | ICD-10-CM | POA: Insufficient documentation

## 2024-02-08 ENCOUNTER — Telehealth: Payer: Self-pay | Admitting: Family Medicine

## 2024-02-08 ENCOUNTER — Other Ambulatory Visit: Payer: Self-pay | Admitting: Internal Medicine

## 2024-02-08 DIAGNOSIS — N6312 Unspecified lump in the right breast, upper inner quadrant: Secondary | ICD-10-CM

## 2024-02-08 NOTE — Telephone Encounter (Signed)
 Copied from CRM [id number]. Topic: General - Other >> Feb 07, 2024  2:05 PM Carmell R wrote: Reason for CRM: Paperwork was sent from [employer] for FMLA for Elsie C. Giroux. He says he is supposed to authorize the paperwork. Please contact him once received or completed. [phone]

## 2024-02-09 ENCOUNTER — Telehealth: Payer: Self-pay | Admitting: Family Medicine

## 2024-02-09 DIAGNOSIS — I10 Essential (primary) hypertension: Secondary | ICD-10-CM

## 2024-02-09 MED ORDER — BENAZEPRIL HCL 40 MG PO TABS: 40.0000 mg | ORAL_TABLET | Freq: Every day | ORAL | 0 refills | Status: DC

## 2024-02-09 NOTE — Telephone Encounter (Signed)
 Copied from CRM [id number]. Topic: Clinical - Medication Refill >> Feb 09, 2024 10:54 AM Zane F wrote: Patient has about 6 pills left of her current prescription and needs the following prescription sent in.  Medication: benazepril  (LOTENSIN ) 40 MG tablet  Has the patient contacted their pharmacy? Yes  This is the patient's preferred pharmacy:  [phone] - [place], [place] - [address] [place] [place] [address] Phone: [phone] Fax: [phone]  Is this the correct pharmacy for this prescription? Yes   Has the prescription been filled recently? No  Is the patient out of the medication? No  Has the patient been seen for an appointment in the last year OR does the patient have an upcoming appointment? Yes  Can we respond through MyChart? No, Patient would like to be called when refill is placed [phone]   Agent: Please be advised that Rx refills may take up to 3 business days. We ask that you follow-up with your pharmacy.

## 2024-02-10 ENCOUNTER — Telehealth: Payer: Self-pay

## 2024-02-10 NOTE — Telephone Encounter (Signed)
 Copied from CRM [id number]. Topic: General - Other >> Feb 07, 2024  2:05 PM Carmell R wrote: Reason for CRM: Paperwork was sent from [employer] for FMLA for Elsie C. Hammitt. He says he is supposed to authorize the paperwork. Please contact him once received or completed. [phone] >> Feb 10, 2024  3:47 PM Zane F wrote: Patient's son, Elsie, is calling in hopes of getting in contact with Tonya regarding an update on his FMLA paperwork; Agent did attempt to get in contact with the CMA in question but she was unavailable in clinic; The patient's son was warm transferred to [facility] for further assiatnce

## 2024-02-10 NOTE — Telephone Encounter (Signed)
 Pt's son Michelle Lawrence called back to speak to [place]. Please contact him back. Thank you.

## 2024-02-10 NOTE — Telephone Encounter (Signed)
 Zebedee DEL. Spoke with pt's son and advise him that this form should actually be completed by Dr. Girard for him for him to get FMLA for his mother.   He said that he would contact his employer and have them send over the form to her office and asked that we send the form that we received back to them.  Zebedee faxed the form back to the original sender and gave this back to me for proof in case something comes up about this later.

## 2024-02-10 NOTE — Telephone Encounter (Signed)
 LVM asking pt's son to rtn call about FMLA and the type of leave that he is requesting for her.

## 2024-02-10 NOTE — Telephone Encounter (Signed)
 This has been taken care of. He was advised to contact his mother's hem/onc doctor for this.

## 2024-02-11 DIAGNOSIS — D509 Iron deficiency anemia, unspecified: Secondary | ICD-10-CM | POA: Diagnosis not present

## 2024-02-11 NOTE — Progress Notes (Signed)
 IRON EDUCATION   Iron education completed with patient by note Clinical research associate for [employer]. Patient learns best by verbal and written information.    Benefits of treatment reviewed with patient, in addition to side effects and potential hypersensitivity reactions. Written teaching sheets provided on iron patient is receiving in clinic.   Patient verbalized an understanding of the treatment plan.  Patient advised to call the clinic with any questions or concerns. For emergencies, patient advised to report to emergency room or dial 911.

## 2024-02-16 ENCOUNTER — Ambulatory Visit
Admission: RE | Admit: 2024-02-16 | Discharge: 2024-02-16 | Disposition: A | Discharge status: Home / Self Care | Source: Ambulatory Visit | Attending: Internal Medicine | Admitting: Internal Medicine

## 2024-02-16 ENCOUNTER — Other Ambulatory Visit: Payer: Self-pay | Admitting: Internal Medicine

## 2024-02-16 DIAGNOSIS — N6312 Unspecified lump in the right breast, upper inner quadrant: Secondary | ICD-10-CM

## 2024-02-17 ENCOUNTER — Other Ambulatory Visit: Payer: Self-pay | Admitting: Internal Medicine

## 2024-02-17 DIAGNOSIS — N6310 Unspecified lump in the right breast, unspecified quadrant: Secondary | ICD-10-CM

## 2024-02-24 ENCOUNTER — Ambulatory Visit
Admission: RE | Admit: 2024-02-24 | Discharge: 2024-02-24 | Disposition: A | Discharge status: Home / Self Care | Source: Ambulatory Visit | Attending: Internal Medicine | Admitting: Internal Medicine

## 2024-02-24 DIAGNOSIS — N6312 Unspecified lump in the right breast, upper inner quadrant: Secondary | ICD-10-CM | POA: Diagnosis not present

## 2024-02-24 DIAGNOSIS — N6310 Unspecified lump in the right breast, unspecified quadrant: Secondary | ICD-10-CM

## 2024-02-24 HISTORY — PX: BREAST BIOPSY: SHX20

## 2024-02-25 ENCOUNTER — Telehealth: Payer: Self-pay | Admitting: Family Medicine

## 2024-02-25 LAB — SURGICAL PATHOLOGY

## 2024-02-25 NOTE — Telephone Encounter (Signed)
 Please call her son Yevonne Yokum I will give you the copy of the paperwork.  Please see previous phone note about him on completing the paperwork since they know the frequency of her treatments and visits.  But they have sent us  another form request from [employer] so I am not sure if this is just an error?

## 2024-02-28 NOTE — Telephone Encounter (Signed)
Attempted call to patient . Phone rang without answer. Could not leave a voice mail message.

## 2024-02-28 NOTE — Telephone Encounter (Signed)
 Attempted call again to   Debralee, Braaksma (Spouse) [phone]  Phone rang again without answer. Could not leave a voice mail message.

## 2024-02-29 NOTE — Telephone Encounter (Signed)
 Spoke with Reid-  gave me contact information for her son GLENWOOD fallow - [phone] Called and left a voice mail message requesting a return call form patient son to discuss

## 2024-02-29 NOTE — Telephone Encounter (Signed)
 Spoke with patient's son  regarding FMLA paperwork . He states everything was completed by Oncology and nothing further is needed from our office.  I will shred the paperwork received in our office.

## 2024-02-29 NOTE — Telephone Encounter (Signed)
 Attempted call again to patient and husband at Phone: [phone] (H) . Left a voice mail message requesting a return call on listed home # As well as husband - elsie listed number - phone rang without answer . Could  not leave a voice mail message.

## 2024-03-01 DIAGNOSIS — D509 Iron deficiency anemia, unspecified: Secondary | ICD-10-CM | POA: Diagnosis not present

## 2024-03-01 DIAGNOSIS — C7951 Secondary malignant neoplasm of bone: Secondary | ICD-10-CM | POA: Diagnosis not present

## 2024-03-01 DIAGNOSIS — C3491 Malignant neoplasm of unspecified part of right bronchus or lung: Secondary | ICD-10-CM | POA: Diagnosis not present

## 2024-03-01 DIAGNOSIS — M8589 Other specified disorders of bone density and structure, multiple sites: Secondary | ICD-10-CM | POA: Diagnosis not present

## 2024-03-01 DIAGNOSIS — E559 Vitamin D deficiency, unspecified: Secondary | ICD-10-CM | POA: Diagnosis not present

## 2024-03-01 DIAGNOSIS — Z7983 Long term (current) use of bisphosphonates: Secondary | ICD-10-CM | POA: Diagnosis not present

## 2024-03-01 DIAGNOSIS — I10 Essential (primary) hypertension: Secondary | ICD-10-CM | POA: Diagnosis not present

## 2024-03-01 DIAGNOSIS — N6312 Unspecified lump in the right breast, upper inner quadrant: Secondary | ICD-10-CM | POA: Diagnosis not present

## 2024-03-01 DIAGNOSIS — D500 Iron deficiency anemia secondary to blood loss (chronic): Secondary | ICD-10-CM | POA: Diagnosis not present

## 2024-03-07 DIAGNOSIS — N289 Disorder of kidney and ureter, unspecified: Secondary | ICD-10-CM | POA: Diagnosis not present

## 2024-03-07 DIAGNOSIS — C3491 Malignant neoplasm of unspecified part of right bronchus or lung: Secondary | ICD-10-CM | POA: Diagnosis not present

## 2024-03-07 DIAGNOSIS — C7951 Secondary malignant neoplasm of bone: Secondary | ICD-10-CM | POA: Diagnosis not present

## 2024-03-07 DIAGNOSIS — N6310 Unspecified lump in the right breast, unspecified quadrant: Secondary | ICD-10-CM | POA: Diagnosis not present

## 2024-03-07 DIAGNOSIS — R918 Other nonspecific abnormal finding of lung field: Secondary | ICD-10-CM | POA: Diagnosis not present

## 2024-03-07 DIAGNOSIS — E278 Other specified disorders of adrenal gland: Secondary | ICD-10-CM | POA: Diagnosis not present

## 2024-03-07 DIAGNOSIS — K7689 Other specified diseases of liver: Secondary | ICD-10-CM | POA: Diagnosis not present

## 2024-03-07 DIAGNOSIS — K769 Liver disease, unspecified: Secondary | ICD-10-CM | POA: Diagnosis not present

## 2024-03-07 DIAGNOSIS — R590 Localized enlarged lymph nodes: Secondary | ICD-10-CM | POA: Diagnosis not present

## 2024-03-07 DIAGNOSIS — C3411 Malignant neoplasm of upper lobe, right bronchus or lung: Secondary | ICD-10-CM | POA: Diagnosis not present

## 2024-03-10 DIAGNOSIS — Z452 Encounter for adjustment and management of vascular access device: Secondary | ICD-10-CM | POA: Diagnosis not present

## 2024-03-10 DIAGNOSIS — C3491 Malignant neoplasm of unspecified part of right bronchus or lung: Secondary | ICD-10-CM | POA: Diagnosis not present

## 2024-03-10 DIAGNOSIS — C7951 Secondary malignant neoplasm of bone: Secondary | ICD-10-CM | POA: Diagnosis not present

## 2024-03-13 DIAGNOSIS — C7951 Secondary malignant neoplasm of bone: Secondary | ICD-10-CM | POA: Diagnosis not present

## 2024-03-13 DIAGNOSIS — C3491 Malignant neoplasm of unspecified part of right bronchus or lung: Secondary | ICD-10-CM | POA: Diagnosis not present

## 2024-03-13 DIAGNOSIS — I10 Essential (primary) hypertension: Secondary | ICD-10-CM | POA: Diagnosis not present

## 2024-03-15 DIAGNOSIS — C7951 Secondary malignant neoplasm of bone: Secondary | ICD-10-CM | POA: Diagnosis not present

## 2024-03-15 DIAGNOSIS — C3491 Malignant neoplasm of unspecified part of right bronchus or lung: Secondary | ICD-10-CM | POA: Diagnosis not present

## 2024-03-16 DIAGNOSIS — C3491 Malignant neoplasm of unspecified part of right bronchus or lung: Secondary | ICD-10-CM | POA: Diagnosis not present

## 2024-03-16 DIAGNOSIS — D509 Iron deficiency anemia, unspecified: Secondary | ICD-10-CM | POA: Diagnosis not present

## 2024-03-17 DIAGNOSIS — E559 Vitamin D deficiency, unspecified: Secondary | ICD-10-CM | POA: Diagnosis not present

## 2024-03-17 DIAGNOSIS — C7951 Secondary malignant neoplasm of bone: Secondary | ICD-10-CM | POA: Diagnosis not present

## 2024-03-17 DIAGNOSIS — M8589 Other specified disorders of bone density and structure, multiple sites: Secondary | ICD-10-CM | POA: Diagnosis not present

## 2024-03-17 DIAGNOSIS — Z79899 Other long term (current) drug therapy: Secondary | ICD-10-CM | POA: Diagnosis not present

## 2024-03-17 DIAGNOSIS — M7989 Other specified soft tissue disorders: Secondary | ICD-10-CM | POA: Diagnosis not present

## 2024-03-17 DIAGNOSIS — Z5111 Encounter for antineoplastic chemotherapy: Secondary | ICD-10-CM | POA: Diagnosis not present

## 2024-03-17 DIAGNOSIS — C3491 Malignant neoplasm of unspecified part of right bronchus or lung: Secondary | ICD-10-CM | POA: Diagnosis not present

## 2024-03-17 DIAGNOSIS — I10 Essential (primary) hypertension: Secondary | ICD-10-CM | POA: Diagnosis not present

## 2024-03-17 DIAGNOSIS — M79605 Pain in left leg: Secondary | ICD-10-CM | POA: Diagnosis not present

## 2024-03-20 ENCOUNTER — Ambulatory Visit: Payer: Self-pay

## 2024-03-20 DIAGNOSIS — C3411 Malignant neoplasm of upper lobe, right bronchus or lung: Secondary | ICD-10-CM | POA: Diagnosis not present

## 2024-03-20 DIAGNOSIS — Z87891 Personal history of nicotine dependence: Secondary | ICD-10-CM | POA: Diagnosis not present

## 2024-03-20 DIAGNOSIS — C7951 Secondary malignant neoplasm of bone: Secondary | ICD-10-CM | POA: Diagnosis not present

## 2024-03-20 NOTE — Telephone Encounter (Signed)
    FYI Only or Action Required?: FYI only for provider.  Patient was last seen in primary care on 09/20/2023 by Michelle Dorothyann BIRCH, MD.  Called Nurse Triage reporting Tachycardia.  Symptoms began today.  Interventions attempted: Nothing.  Symptoms are: stable.  Triage Disposition: See HCP Within 4 Hours (Or PCP Triage) Michelle Lawrence with Outpatient Radiation called to report pt. Seen today - HR 136, BP 110/69 O2 SAT 98%. Pt. Reported no symptoms. Attempted to reach pt. To triage, no answer. Left message to call back.  Patient/caregiver understands and will follow disposition?: UnsureCopied from CRM #[id number]. Topic: Clinical - Red Word Triage >> Mar 20, 2024  9:30 AM Michelle Lawrence LABOR wrote: Red Word that prompted transfer to Nurse Triage: Increased Heart Rate Reason for Disposition  Age > 60 years  (Exception: Brief heartbeat symptoms that went away and now feels well.)  Answer Assessment - Initial Assessment Questions 1. DESCRIPTION: Please describe your heart rate or heartbeat that you are having (e.g., fast/slow, regular/irregular, skipped or extra beats, palpitations)     Tachycardia 2. ONSET: When did it start? (e.g., minutes, hours, days)      today 3. DURATION: How long does it last (e.g., seconds, minutes, hours)     constant 4. PATTERN Does it come and go, or has it been constant since it started?  Does it get worse with exertion?   Are you feeling it now?     constant 5. TAP: Using your hand, can you tap out what you are feeling on a chair or table in front of you, so that I can hear? Note: Not all patients can do this.       no 6. HEART RATE: Can you tell me your heart rate? How many beats in 15 seconds?  Note: Not all patients can do this.       136 7. RECURRENT SYMPTOM: Have you ever had this before? If Yes, ask: When was the last time? and What happened that time?      no 8. CAUSE: What do you think is causing the palpitations?     unsure 9. CARDIAC  HISTORY: Do you have any history of heart disease? (e.g., heart attack, angina, bypass surgery, angioplasty, arrhythmia)      no 10. OTHER SYMPTOMS: Do you have any other symptoms? (e.g., dizziness, chest pain, sweating, difficulty breathing)       no 11. PREGNANCY: Is there any chance you are pregnant? When was your last menstrual period?       no  Protocols used: Heart Rate and Heartbeat Questions-A-AH

## 2024-03-21 ENCOUNTER — Ambulatory Visit: Payer: Medicare Other | Admitting: Family Medicine

## 2024-03-21 NOTE — Telephone Encounter (Signed)
Left message advising a return call.  

## 2024-03-22 DIAGNOSIS — Z510 Encounter for antineoplastic radiation therapy: Secondary | ICD-10-CM | POA: Diagnosis not present

## 2024-03-22 DIAGNOSIS — C7951 Secondary malignant neoplasm of bone: Secondary | ICD-10-CM | POA: Diagnosis not present

## 2024-03-22 DIAGNOSIS — C3491 Malignant neoplasm of unspecified part of right bronchus or lung: Secondary | ICD-10-CM | POA: Diagnosis not present

## 2024-03-22 DIAGNOSIS — C3411 Malignant neoplasm of upper lobe, right bronchus or lung: Secondary | ICD-10-CM | POA: Diagnosis not present

## 2024-03-22 DIAGNOSIS — Z87891 Personal history of nicotine dependence: Secondary | ICD-10-CM | POA: Diagnosis not present

## 2024-03-23 NOTE — Telephone Encounter (Signed)
Attempted call to patient. Again left a voice mail message requesting a return call.

## 2024-03-24 DIAGNOSIS — Z85118 Personal history of other malignant neoplasm of bronchus and lung: Secondary | ICD-10-CM | POA: Diagnosis not present

## 2024-03-24 DIAGNOSIS — D630 Anemia in neoplastic disease: Secondary | ICD-10-CM | POA: Diagnosis not present

## 2024-03-24 DIAGNOSIS — Z5111 Encounter for antineoplastic chemotherapy: Secondary | ICD-10-CM | POA: Diagnosis not present

## 2024-03-24 DIAGNOSIS — I080 Rheumatic disorders of both mitral and aortic valves: Secondary | ICD-10-CM | POA: Diagnosis not present

## 2024-03-24 DIAGNOSIS — R9431 Abnormal electrocardiogram [ECG] [EKG]: Secondary | ICD-10-CM | POA: Diagnosis not present

## 2024-03-24 DIAGNOSIS — I10 Essential (primary) hypertension: Secondary | ICD-10-CM | POA: Diagnosis not present

## 2024-03-24 DIAGNOSIS — C3411 Malignant neoplasm of upper lobe, right bronchus or lung: Secondary | ICD-10-CM | POA: Diagnosis not present

## 2024-03-24 DIAGNOSIS — Z79899 Other long term (current) drug therapy: Secondary | ICD-10-CM | POA: Diagnosis not present

## 2024-03-24 DIAGNOSIS — Z87891 Personal history of nicotine dependence: Secondary | ICD-10-CM | POA: Diagnosis not present

## 2024-03-24 DIAGNOSIS — D649 Anemia, unspecified: Secondary | ICD-10-CM | POA: Diagnosis not present

## 2024-03-24 DIAGNOSIS — C3491 Malignant neoplasm of unspecified part of right bronchus or lung: Secondary | ICD-10-CM | POA: Diagnosis not present

## 2024-03-24 DIAGNOSIS — C3490 Malignant neoplasm of unspecified part of unspecified bronchus or lung: Secondary | ICD-10-CM | POA: Diagnosis not present

## 2024-03-24 DIAGNOSIS — Z510 Encounter for antineoplastic radiation therapy: Secondary | ICD-10-CM | POA: Diagnosis not present

## 2024-03-24 DIAGNOSIS — T461X5A Adverse effect of calcium-channel blockers, initial encounter: Secondary | ICD-10-CM | POA: Diagnosis not present

## 2024-03-24 DIAGNOSIS — I4891 Unspecified atrial fibrillation: Secondary | ICD-10-CM | POA: Diagnosis not present

## 2024-03-24 DIAGNOSIS — E876 Hypokalemia: Secondary | ICD-10-CM | POA: Diagnosis not present

## 2024-03-24 DIAGNOSIS — I952 Hypotension due to drugs: Secondary | ICD-10-CM | POA: Diagnosis not present

## 2024-03-24 DIAGNOSIS — C7951 Secondary malignant neoplasm of bone: Secondary | ICD-10-CM | POA: Diagnosis not present

## 2024-03-24 DIAGNOSIS — E785 Hyperlipidemia, unspecified: Secondary | ICD-10-CM | POA: Diagnosis not present

## 2024-03-24 NOTE — Telephone Encounter (Signed)
 Spoke with patient . She is feeling better . Symptoms have resolved.  She is having pain In her left leg and has radiation scheduled for next week  She will call us  if symptoms return or if needing anything further.

## 2024-03-25 DIAGNOSIS — Z510 Encounter for antineoplastic radiation therapy: Secondary | ICD-10-CM | POA: Diagnosis not present

## 2024-03-25 DIAGNOSIS — C3491 Malignant neoplasm of unspecified part of right bronchus or lung: Secondary | ICD-10-CM | POA: Diagnosis not present

## 2024-03-25 DIAGNOSIS — C7951 Secondary malignant neoplasm of bone: Secondary | ICD-10-CM | POA: Diagnosis not present

## 2024-03-26 NOTE — Discharge Summary (Signed)
 [facility] Discharge Summary  PCP: Dorothyann JONETTA Byars, MD Discharge Details   Admit date:         03/24/2024 Discharge date:        03/26/2024  Hospital LOS:    1 days DIscharge Disposition:    Active Hospital Problems   Diagnosis Date Noted POA  . *Atrial fibrillation with rapid ventricular response (*) 03/24/2024 Yes  . Atrial fibrillation with RVR (*) 03/24/2024 Yes  . Normocytic anemia 03/24/2024 Yes  . Dyslipidemia 03/24/2024 Yes  . History of lung cancer 03/24/2024 Not Applicable  . Prolonged Q-T interval on ECG 03/24/2024 Yes  . Essential hypertension 10/24/2010 Yes    Resolved Hospital Problems  No resolved problems to display.      Current Discharge Medication List     START taking these medications      Details  amiodarone 200 mg tablet Commonly known as: PACERONE  Take one tablet (200 mg dose) by mouth 2 (two) times daily. Quantity: 60 tablet   diltiazem HCl 180 mg 24 hr capsule Commonly known as: CARDIZEM CD Start taking on: March 27, 2024  Take one capsule (180 mg dose) by mouth daily. Quantity: 30 capsule       CONTINUE these medications which have NOT CHANGED      Details  calcium  citrate-vitamin D  315-200 mg-unit per tablet Commonly known as: CITRACAL+D  Take two tablets by mouth 2 (two) times daily.   D3 2000 50 mcg (2000 UT) Caps capsule Generic drug: cholecalciferol  Take by mouth.   lidocaine -prilocaine cream Commonly known as: EMLA  Apply topically as needed. apply topically to portacath site as needed 1-2 hours prior to accessing portacath Quantity: 30 g   ondansetron 8 mg tablet Commonly known as: ZOFRAN  Take one tablet (8 mg dose) by mouth every 8 (eight) hours as needed for Nausea (or vomiting). Indication: Nausea and Vomiting caused by Cancer Chemotherapy Quantity: 30 tablet   prochlorperazine 5 MG tablet Commonly known as: COMPAZINE  Take one tablet (5 mg dose) by mouth every 6 (six) hours  as needed for Nausea (or vomiting). Quantity: 30 tablet   rosuvastatin  calcium  10 mg tablet Commonly known as: CRESTOR   Take one tablet (10 mg dose) by mouth daily.   vitamin B-12 500 mcg tablet Commonly known as: CYANOCOBALAMIN  Take one half tablet (250 mcg dose) by mouth daily.      * You might also be taking other medications not listed above. If you have questions about any of your other medications, talk to the person who prescribed them or your Primary Care Provider.          STOP taking these medications    benazepril  HCl 40 mg tablet Commonly known as: LOTENSIN         Reason for Medication Changes:  Hospital Course  Physicians involved in care during this hospitalization Attending Provider: Prentice Mattes, DO Attending Provider: Yancy Sorrel, MD Attending Provider: Balinda GORMAN Short, DO Admitting Provider: Yancy Sorrel, MD Consulting Physician: Derryl Duval, MD Consulting Physician: Sherron DELENA Champ, MD Consulting Physician: Yancy Sorrel, MD Consulting Physician: Balinda GORMAN Short, DO  Indication for Admission: low hemoglobin   History of Present Illness (from H&P): Patient is a 78 year old female, with a PMH of Non- Small Cell Lung Cancer, HTN, and Dyslipidemia who presents to the hospital for anemia. Patient presented to the her Hem/Onc appointment for chemotherapy on 03-24-2024 and was found to have a Hg less than 7.  Patient received her chemotherapy regimen and was referred to the ED for blood transfusion.     Incidentally while in the ED, patient was found to have AFIB with RVR. Patient denies chest pain, shortness of breath, nausea, vomiting, or diarrhea. No alcohol use.    Patient's chemotherapy regimen consist of taxol and carboplatin. Today was patient's second dose.    Personally Reviewed RN Murray Nursing Note on 03-24-2024 for Chemotherapy Infusion    Personally Reviewed EKG- AFIB with RVR, Rate- 145, QTC-506, No ST elevation  Hospital Course:     Patient was admitted to intermediate care unit and treated for the following:   # AFIB with RVR-  EKG on admission was consistent with AFIB with RVR. New Onset. Seen by cardiology. Initially placed on cardizem gtt but BP dropped. Then she was transitioned to amiodarone gtt and converted to NSR. She will be continued on PO amiodarone and cardizem at discharge.    Patient has a CHADSVASC Score 4 with 4.8% risk of CVA a year. Patient was educated on the risks and benefits of anticoagulation for stroke prophylaxis. CTPA and CT abd/pelvis with no source of active bleeding.  Cards states anticoagulation can be avoided until her hemoglobin has stabilized and is at a more reasonable level.    # Normocytic Anemia- Hg was low at 6.6 with baseline between 9-10. Likely secondary to chemotherapy. Pt was transfused 1 unit PRBCs and hemoglobin rose to 8 on day of discharge.    # Essential HTN- hold Benzapril 40 mg daily. Was started on cardizem and amiodarone by cardiology.     # Dyslipidemia-continue Crestor  10 mg daily    # History of Lung Cancer- Patient will need to follow up with Hem/Onc in the outpatient setting   # Prolong QTC on EKG- QTC was Prolonged at 506 on admission.     #hypokalemia-repleted and resolved.   On day of discharge, pt was hemodynamically stable. She can f/u with PCP in 1 week and with cardiology in 1-2 weeks. Would recommend repeat cbc in 1 week and repeat ECG in 1 week.           Bedside Procedures     Critical Care    Electronically signed by: Norleen JULIANNA Arthur, DO on 03/24/24 2010 Status: Completed  Ordering user: Norleen JULIANNA Arthur, DO 03/24/24 2010 Ordering provider: Norleen JULIANNA Arthur, DO       Post Hospital Care   Discharge Procedure Orders  Follow-up with Primary Care Physician  Referral Priority: Routine Referral Type: Consultation  Referral Reason: Evaluate and Return  Number of Visits Requested: 1 Expiration Date: 09/21/24   Ambulatory referral to Cardiology   Referral Priority: Routine Referral Type: Consultation  Referral Reason: Evaluate and Return  Referred to Provider: DORISANN NORLEEN HERO Requested Specialty: Cardiology  Number of Visits Requested: 1 Expiration Date: 09/21/24   Cardiac diet (heart healthy)   Activity as tolerated   Discharge instructions  Order Comments: -continue cardizem and amiodarone -f/u closely with PCP in 1 week and with cardiology in 1-2 weeks.   Appointments which have been scheduled    Mar 27, 2024 3:35 PM RADIATION ONCOLOGY TREATMENT with Prentice ONEIDA Conroy, MD, [facility] RAD [facility] Radiation Oncology [facility]) [address] [place] [address] [phone]   Mar 28, 2024 11:20 AM RADIATION ONCOLOGY TREATMENT with [facility] RAD [facility] Radiation Oncology [facility]) [address] [place] [address]  [phone]   Mar 29, 2024 11:15 AM RADIATION ONCOLOGY TREATMENT with [facility] RAD [facility] Radiation Oncology [facility]) [address] [place] [address] [phone]   Mar 30, 2024 11:50 AM RADIATION ONCOLOGY TREATMENT with [facility] RAD [facility] Radiation Oncology [facility]) [address] [place] [address] [phone]   Mar 31, 2024 9:00 AM Lab/Chemo with [facility] ONC LAB, [facility] ONC [facility] CHA[address] [facility] [phone] [place] [address] [phone]   Mar 31, 2024 10:50 AM RADIATION ONCOLOGY TREATMENT with [facility] RAD [facility] Radiation Oncology [facility]) [address] [place] [address] [phone]   Apr 13, 2024 8:45 AM Lab/Provider/Chemo Visit 2 hr with Debby Marcey Mandes III, PA, [facility] ONC LAB, [facility] ONC CHEMO CHAIR 12 [facility] ([facility] [phone] [address] [place] [address] [phone]   Apr 21, 2024 9:00 AM Lab/Chemo with [facility] ONC LAB, [facility] ONC CHEMO CHAIR 14 [facility] ([facility] [phone] [address] [place] [address] [phone]   Apr 28, 2024 9:00 AM Lab/Chemo with [facility] ONC LAB, [employer] ONC CHEMO CHAIR 14 [facility] ([facility] [phone] [address] [place] [address] [phone]   May 12, 2024 8:45 AM Lab/Provider/Chemo Visit 2 hr with Isaiah SHAUNNA Candy, PA, [facility] ONC LAB, [facility] ONC CHEMO CHAIR 11 [facility] ([facility] [phone] [place] [address] [phone]   May 19, 2024 9:00 AM Lab/Chemo with [facility] ONC LAB, [facility] ONC CHEMO CHAIR 15 [facility] ([facility] [phone] [address] [place] [address] [phone]   May 26, 2024 9:00 AM Lab/Chemo with [facility] ONC LAB, [facility] ONC CHEMO CHAIR 14 [facility] ([facility] [phone] [address] [place] [address] [phone]      Unresulted Labs     Order Current Status   Prepare RBC, 2 Units, Irradiated Preliminary result          Wound Care Recommendations: None     Recommendations to physicians:   Followup PCP 1 week  Followup cardiology in 1-2 weeks   Recommendations for followup labs and/or imaging: repeat cbc and ECG in 1 week   Rehab/Home health orders:          Code Status:   Full Code   Time spent in discharge process:  35  minutes  This note was dictated with voice recognition software. Similar sounding words can inadvertently be transcribed and may not be corrected upon review  Electronically signed: Balinda GORMAN Short, DO 03/26/2024 / 12:36 PM  *Some images could not be shown.

## 2024-03-27 ENCOUNTER — Telehealth: Payer: Self-pay

## 2024-03-27 DIAGNOSIS — C3411 Malignant neoplasm of upper lobe, right bronchus or lung: Secondary | ICD-10-CM | POA: Diagnosis not present

## 2024-03-27 DIAGNOSIS — C7951 Secondary malignant neoplasm of bone: Secondary | ICD-10-CM | POA: Diagnosis not present

## 2024-03-27 DIAGNOSIS — Z510 Encounter for antineoplastic radiation therapy: Secondary | ICD-10-CM | POA: Diagnosis not present

## 2024-03-27 DIAGNOSIS — Z87891 Personal history of nicotine dependence: Secondary | ICD-10-CM | POA: Diagnosis not present

## 2024-03-27 DIAGNOSIS — C3491 Malignant neoplasm of unspecified part of right bronchus or lung: Secondary | ICD-10-CM | POA: Diagnosis not present

## 2024-03-27 NOTE — Transitions of Care (Post Inpatient/ED Visit) (Signed)
   03/27/2024  Name: Michelle Lawrence MRN: [id number] DOB: 27-Sep-1945  Today's TOC FU Call Status: Today's TOC FU Call Status:: Unsuccessful Call (1st Attempt) Unsuccessful Call (1st Attempt) Date: 03/27/24  Attempted to reach the patient regarding the most recent Inpatient/ED visit.  Follow Up Plan: Additional outreach attempts will be made to reach the patient to complete the Transitions of Care (Post Inpatient/ED visit) call.   Shona Prow RN, CCM [place]  VBCI-Population Health RN Care Manager [phone]

## 2024-03-28 ENCOUNTER — Telehealth: Payer: Self-pay

## 2024-03-28 DIAGNOSIS — C3491 Malignant neoplasm of unspecified part of right bronchus or lung: Secondary | ICD-10-CM | POA: Diagnosis not present

## 2024-03-28 DIAGNOSIS — Z510 Encounter for antineoplastic radiation therapy: Secondary | ICD-10-CM | POA: Diagnosis not present

## 2024-03-28 DIAGNOSIS — C7951 Secondary malignant neoplasm of bone: Secondary | ICD-10-CM | POA: Diagnosis not present

## 2024-03-28 NOTE — Transitions of Care (Post Inpatient/ED Visit) (Signed)
   03/28/2024  Name: Michelle Lawrence MRN: [id number] DOB: 1946/04/16  Today's TOC FU Call Status: Today's TOC FU Call Status:: Unsuccessful Call (2nd Attempt) Unsuccessful Call (2nd Attempt) Date: 03/28/24  Attempted to reach the patient regarding the most recent Inpatient/ED visit.  Follow Up Plan: Additional outreach attempts will be made to reach the patient to complete the Transitions of Care (Post Inpatient/ED visit) call.   Shona Prow RN, CCM   VBCI-Population Health RN Care Manager [phone]

## 2024-03-29 ENCOUNTER — Telehealth: Payer: Self-pay

## 2024-03-29 DIAGNOSIS — C3491 Malignant neoplasm of unspecified part of right bronchus or lung: Secondary | ICD-10-CM | POA: Diagnosis not present

## 2024-03-29 DIAGNOSIS — Z510 Encounter for antineoplastic radiation therapy: Secondary | ICD-10-CM | POA: Diagnosis not present

## 2024-03-29 DIAGNOSIS — C7951 Secondary malignant neoplasm of bone: Secondary | ICD-10-CM | POA: Diagnosis not present

## 2024-03-29 NOTE — Transitions of Care (Post Inpatient/ED Visit) (Signed)
   03/29/2024  Name: Michelle Lawrence MRN: [id number] DOB: [birth year]  Today's TOC FU Call Status: Today's TOC FU Call Status:: Unsuccessful Call (3rd Attempt) Unsuccessful Call (3rd Attempt) Date: 03/29/24  Attempted to reach the patient regarding the most recent Inpatient/ED visit.  Follow Up Plan: No further outreach attempts will be made at this time. We have been unable to contact the patient.  Shona Prow RN, CCM [place]  VBCI-Population Health RN Care Manager [phone]

## 2024-03-30 ENCOUNTER — Encounter: Payer: Self-pay | Admitting: Family Medicine

## 2024-03-30 ENCOUNTER — Ambulatory Visit (INDEPENDENT_AMBULATORY_CARE_PROVIDER_SITE_OTHER): Admitting: Family Medicine

## 2024-03-30 VITALS — BP 127/43 | HR 83 | Ht 64.0 in | Wt 136.1 lb

## 2024-03-30 DIAGNOSIS — R630 Anorexia: Secondary | ICD-10-CM | POA: Diagnosis not present

## 2024-03-30 DIAGNOSIS — I10 Essential (primary) hypertension: Secondary | ICD-10-CM | POA: Diagnosis not present

## 2024-03-30 DIAGNOSIS — C7951 Secondary malignant neoplasm of bone: Secondary | ICD-10-CM | POA: Diagnosis not present

## 2024-03-30 DIAGNOSIS — D6481 Anemia due to antineoplastic chemotherapy: Secondary | ICD-10-CM | POA: Diagnosis not present

## 2024-03-30 DIAGNOSIS — C3491 Malignant neoplasm of unspecified part of right bronchus or lung: Secondary | ICD-10-CM | POA: Diagnosis not present

## 2024-03-30 DIAGNOSIS — T451X5A Adverse effect of antineoplastic and immunosuppressive drugs, initial encounter: Secondary | ICD-10-CM | POA: Diagnosis not present

## 2024-03-30 DIAGNOSIS — Z510 Encounter for antineoplastic radiation therapy: Secondary | ICD-10-CM | POA: Diagnosis not present

## 2024-03-30 NOTE — Assessment & Plan Note (Signed)
 BP at goal today on new regimen.

## 2024-03-30 NOTE — Progress Notes (Signed)
 Established Patient Office Visit  Subjective  Patient ID: Michelle Lawrence, female    DOB: 06-09-1946  Age: 78 y.o. MRN: [id number]  Chief Complaint  Patient presents with   Hospitalization Follow-up    HPI  Hospital Follow up:  Patient is a 78 year old female, with a PMH of Non- Small Cell Lung Cancer, HTN, and Dyslipidemia who presented to the hospital for anemia. Patient presented to the her Hem/Onc appointment for chemotherapy on 03-24-2024 and was found to have a Hg less than 7. Patient received her chemotherapy regimen and was referred to the ED for blood transfusion.   Incidentally while in the ED, patient was found to have AFIB with RVR. Patient denied chest pain, shortness of breath, nausea, vomiting, or diarrhea. No alcohol use.   Patient's chemotherapy regimen consists of taxol and carboplatin.  She was admitted to intermediate care unit and treated for the following:   # AFIB with RVR- EKG on admission was consistent with AFIB with RVR. New Onset. Seen by cardiology. Initially placed on cardizem gtt but BP dropped. Then she was transitioned to amiodarone gtt and converted to NSR. She will be continued on PO amiodarone and cardizem at discharge.   Patient has a CHADSVASC Score 4 with 4.8% risk of CVA a year. Patient was educated on the risks and benefits of anticoagulation for stroke prophylaxis. CTPA and CT abd/pelvis with no source of active bleeding. Cards states anticoagulation can be avoided until her hemoglobin has stabilized and is at a more reasonable level.   # Normocytic Anemia- Hg was low at 6.6 with baseline between 9-10. Likely secondary to chemotherapy. Pt was transfused 1 unit PRBCs and hemoglobin rose to 8 on day of discharge.   # Essential HTN- hold Benzapril 40 mg daily. Was started on cardizem and amiodarone by cardiology.   Discussed the use of AI scribe software for clinical note transcription with the patient, who gave verbal consent to  proceed.  History of Present Illness Michelle Lawrence is a 78 year old female with anemia and atrial fibrillation who presents for follow-up after recent hospital admission.  Anemia - Recent hospitalization for severe anemia with hemoglobin level of 7 g/dL - Received blood transfusion during hospital stay - No current symptoms related to anemia - No blood thinners prescribed due to anemia  Atrial fibrillation - Diagnosed with atrial fibrillation during recent hospital admission - Heart rhythm spontaneously converted to normal sinus rhythm without intervention - Experienced jitteriness during atrial fibrillation episode without palpitations or tachycardia - Currently asymptomatic with no ongoing symptoms of atrial fibrillation - Started on new medications for atrial fibrillation - Previous antihypertensive medication discontinued  Cancer therapy and associated symptoms - Undergoing chemotherapy via port; next session scheduled for Friday - Receiving radiation therapy five days per week; two sessions remaining this week - Currently on third cycle of chemotherapy - Generally tolerating treatments well - Experienced cold temperatures during hospital stay - Developed pedal edema during hospitalization, attributed to intravenous fluids; swelling is improving  Gastrointestinal symptoms and nutritional concerns - Decreased appetite attributed to cancer treatments - Occasional diarrhea impacting ability to consume calcium -rich foods - Taking vitamin D3 supplements - Currently out of calcium  supplements and plans to purchase more      ROS    Objective:     BP (!) 127/43   Pulse 83   Ht 5' 4 (1.626 m)   Wt 136 lb 1.3 oz (61.7 kg)   SpO2 100%  BMI 23.36 kg/m    Physical Exam Vitals and nursing note reviewed.  Constitutional:      Appearance: Normal appearance.  HENT:     Head: Normocephalic and atraumatic.  Eyes:     Conjunctiva/sclera: Conjunctivae normal.   Cardiovascular:     Rate and Rhythm: Normal rate and regular rhythm.  Pulmonary:     Effort: Pulmonary effort is normal.     Breath sounds: Normal breath sounds.  Skin:    General: Skin is warm and dry.  Neurological:     Mental Status: She is alert.  Psychiatric:        Mood and Affect: Mood normal.    Physical Exam CARDIOVASCULAR: Heart in sinus rhythm, no atrial fibrillation detected.      No results found for any visits on 03/30/24.    The 10-year ASCVD risk score (Arnett DK, et al., 2019) is: 27.2%    Assessment & Plan:   Problem List Items Addressed This Visit       Cardiovascular and Mediastinum   Essential hypertension, benign - Primary   BP at goal today on new regimen.       Relevant Medications   amiodarone (PACERONE) 200 MG tablet   diltiazem (TIAZAC) 180 MG 24 hr capsule   Other Visit Diagnoses       Anemia due to chemotherapy         Anorexia         Hypocalcemia          Assessment and Plan Assessment & Plan Atrial fibrillation, paroxysmal Recent diagnosis post-hospitalization, resolved spontaneously. Currently in sinus rhythm. Cardiologist deferred anticoagulation due to anemia. Discussed thromboembolism risks and potential anticoagulation if arrhythmia persists. - Follow up with cardiologist next Friday. - Consider heart monitor if arrhythmia suspected.  Anemia, unspecified Recent hospitalization with hemoglobin level of 7. Possibly contributing to atrial fibrillation. Undergoing chemotherapy and radiation affecting nutrition. Noted dietary improvement. - Encourage balanced diet with adequate protein.  Chemotherapy and radiation for active cancer Undergoing chemotherapy and radiation. Transitioned to IV chemotherapy with port placement. Experiencing decreased appetite and diarrhea affecting nutrition. - Continue chemotherapy and radiation as scheduled. - Monitor nutritional intake and manage side effects.  Peripheral edema,  mild Mild edema likely due to fluid administration during hospitalization. - Consider compression stockings with 15-20 mmHg pressure. - Elevate legs to reduce swelling.  Anorexia  Possibly related to chemotherapy and radiation. Affects nutrition and may contribute to hypocalcemia. - Encourage dietary adjustments. Add in yogurt, cheesesticks, etc.   Hypocalcemia Low calcium  levels possibly due to diet and diarrhea. Discussed dietary calcium  sources and supplementation. - Increase dietary calcium  with dairy and calcium -rich foods. - Consider Caltrate supplementation, one tablet morning and evening. - Encourage calcium  intake with orange juice for absorption.   Return in about 6 months (around 09/30/2024) for Hypertension.   I spent 40 minutes on the day of the encounter to include pre-visit record review, face-to-face time with the patient and post visit ordering of test.   Dorothyann Byars, MD

## 2024-03-31 DIAGNOSIS — Z87891 Personal history of nicotine dependence: Secondary | ICD-10-CM | POA: Diagnosis not present

## 2024-03-31 DIAGNOSIS — Z510 Encounter for antineoplastic radiation therapy: Secondary | ICD-10-CM | POA: Diagnosis not present

## 2024-03-31 DIAGNOSIS — Z5111 Encounter for antineoplastic chemotherapy: Secondary | ICD-10-CM | POA: Diagnosis not present

## 2024-03-31 DIAGNOSIS — C3411 Malignant neoplasm of upper lobe, right bronchus or lung: Secondary | ICD-10-CM | POA: Diagnosis not present

## 2024-03-31 DIAGNOSIS — C7951 Secondary malignant neoplasm of bone: Secondary | ICD-10-CM | POA: Diagnosis not present

## 2024-03-31 DIAGNOSIS — C3491 Malignant neoplasm of unspecified part of right bronchus or lung: Secondary | ICD-10-CM | POA: Diagnosis not present

## 2024-04-07 DIAGNOSIS — C3491 Malignant neoplasm of unspecified part of right bronchus or lung: Secondary | ICD-10-CM | POA: Diagnosis not present

## 2024-04-07 DIAGNOSIS — D509 Iron deficiency anemia, unspecified: Secondary | ICD-10-CM | POA: Diagnosis not present

## 2024-04-07 DIAGNOSIS — I10 Essential (primary) hypertension: Secondary | ICD-10-CM | POA: Diagnosis not present

## 2024-04-07 DIAGNOSIS — I4891 Unspecified atrial fibrillation: Secondary | ICD-10-CM | POA: Diagnosis not present

## 2024-04-07 DIAGNOSIS — C7951 Secondary malignant neoplasm of bone: Secondary | ICD-10-CM | POA: Diagnosis not present

## 2024-04-08 DIAGNOSIS — Z6822 Body mass index (BMI) 22.0-22.9, adult: Secondary | ICD-10-CM | POA: Diagnosis not present

## 2024-04-08 DIAGNOSIS — Z66 Do not resuscitate: Secondary | ICD-10-CM | POA: Diagnosis not present

## 2024-04-08 DIAGNOSIS — K6389 Other specified diseases of intestine: Secondary | ICD-10-CM | POA: Diagnosis not present

## 2024-04-08 DIAGNOSIS — E876 Hypokalemia: Secondary | ICD-10-CM | POA: Diagnosis not present

## 2024-04-08 DIAGNOSIS — R531 Weakness: Secondary | ICD-10-CM | POA: Diagnosis not present

## 2024-04-08 DIAGNOSIS — Z9221 Personal history of antineoplastic chemotherapy: Secondary | ICD-10-CM | POA: Diagnosis not present

## 2024-04-08 DIAGNOSIS — I482 Chronic atrial fibrillation, unspecified: Secondary | ICD-10-CM | POA: Diagnosis not present

## 2024-04-08 DIAGNOSIS — N6312 Unspecified lump in the right breast, upper inner quadrant: Secondary | ICD-10-CM | POA: Diagnosis not present

## 2024-04-08 DIAGNOSIS — I10 Essential (primary) hypertension: Secondary | ICD-10-CM | POA: Diagnosis not present

## 2024-04-08 DIAGNOSIS — K5989 Other specified functional intestinal disorders: Secondary | ICD-10-CM | POA: Diagnosis not present

## 2024-04-08 DIAGNOSIS — C3490 Malignant neoplasm of unspecified part of unspecified bronchus or lung: Secondary | ICD-10-CM | POA: Diagnosis not present

## 2024-04-08 DIAGNOSIS — Z7189 Other specified counseling: Secondary | ICD-10-CM | POA: Diagnosis not present

## 2024-04-08 DIAGNOSIS — D649 Anemia, unspecified: Secondary | ICD-10-CM | POA: Diagnosis not present

## 2024-04-08 DIAGNOSIS — E43 Unspecified severe protein-calorie malnutrition: Secondary | ICD-10-CM | POA: Diagnosis not present

## 2024-04-08 DIAGNOSIS — N179 Acute kidney failure, unspecified: Secondary | ICD-10-CM | POA: Diagnosis not present

## 2024-04-08 DIAGNOSIS — J9601 Acute respiratory failure with hypoxia: Secondary | ICD-10-CM | POA: Diagnosis not present

## 2024-04-08 DIAGNOSIS — Z882 Allergy status to sulfonamides status: Secondary | ICD-10-CM | POA: Diagnosis not present

## 2024-04-08 DIAGNOSIS — C7951 Secondary malignant neoplasm of bone: Secondary | ICD-10-CM | POA: Diagnosis not present

## 2024-04-08 DIAGNOSIS — Z4682 Encounter for fitting and adjustment of non-vascular catheter: Secondary | ICD-10-CM | POA: Diagnosis not present

## 2024-04-08 DIAGNOSIS — J90 Pleural effusion, not elsewhere classified: Secondary | ICD-10-CM | POA: Diagnosis not present

## 2024-04-08 DIAGNOSIS — E785 Hyperlipidemia, unspecified: Secondary | ICD-10-CM | POA: Diagnosis not present

## 2024-04-08 DIAGNOSIS — R197 Diarrhea, unspecified: Secondary | ICD-10-CM | POA: Diagnosis not present

## 2024-04-08 DIAGNOSIS — Z885 Allergy status to narcotic agent status: Secondary | ICD-10-CM | POA: Diagnosis not present

## 2024-04-08 DIAGNOSIS — Z888 Allergy status to other drugs, medicaments and biological substances status: Secondary | ICD-10-CM | POA: Diagnosis not present

## 2024-04-08 DIAGNOSIS — Z515 Encounter for palliative care: Secondary | ICD-10-CM | POA: Diagnosis not present

## 2024-04-08 DIAGNOSIS — Z87891 Personal history of nicotine dependence: Secondary | ICD-10-CM | POA: Diagnosis not present

## 2024-04-08 DIAGNOSIS — K56609 Unspecified intestinal obstruction, unspecified as to partial versus complete obstruction: Secondary | ICD-10-CM | POA: Diagnosis not present

## 2024-04-08 DIAGNOSIS — D509 Iron deficiency anemia, unspecified: Secondary | ICD-10-CM | POA: Diagnosis not present

## 2024-04-08 DIAGNOSIS — J984 Other disorders of lung: Secondary | ICD-10-CM | POA: Diagnosis not present

## 2024-04-08 DIAGNOSIS — I4891 Unspecified atrial fibrillation: Secondary | ICD-10-CM | POA: Diagnosis not present

## 2024-04-08 DIAGNOSIS — R918 Other nonspecific abnormal finding of lung field: Secondary | ICD-10-CM | POA: Diagnosis not present

## 2024-04-08 DIAGNOSIS — Z7409 Other reduced mobility: Secondary | ICD-10-CM | POA: Diagnosis not present

## 2024-04-08 DIAGNOSIS — C3491 Malignant neoplasm of unspecified part of right bronchus or lung: Secondary | ICD-10-CM | POA: Diagnosis not present

## 2024-04-08 DIAGNOSIS — R1310 Dysphagia, unspecified: Secondary | ICD-10-CM | POA: Diagnosis not present

## 2024-04-13 NOTE — Discharge Summary (Signed)
 [facility]  [facility] Inpatient Discharge Summary  PCP: Dorothyann JONETTA Byars, MD Discharge Details   Admit date:         04/08/2024 Discharge date:        04/13/2024  Hospital Days:    5 days  Code Status:   No CPR Advanced Directives on file: No Directive        Discharge Diagnoses:  Principal Problem:   SBO (small bowel obstruction) (*) Active Problems:   Essential hypertension   Hyperlipemia   Cancer, metastatic to bone (*)   Adenocarcinoma of right lung (*)   Mass of upper inner quadrant of right breast   Iron deficiency anemia   Atrial fibrillation with RVR (*)   Normocytic anemia   Atrial fibrillation with rapid ventricular response (*)   Hypokalemia   Severe malnutrition (*)   Task list for follow-up: Discharged to hospice   Unresulted Labs     Order Current Status   Culture, Blood Blood Vein Preliminary result   Culture, Blood Blood Vein Preliminary result       Follow-Up Appointments Suggested: [facility] [address] [place]  [address] [phone]    MALLIE KATHEE HOCK [facility] [place] [place] [phone]     Follow-Up Appointments Already Scheduled: Future Appointments  Date Time Provider Department Center  04/21/2024  9:00 AM [facility]  04/28/2024  9:00 AM [facility]  05/05/2024 10:30 AM Sherron DELENA Champ, MD [facility] None  05/12/2024  8:45 AM Isaiah SHAUNNA Candy, PA [facility]  05/19/2024  9:00 AM [facility]  05/26/2024  9:00 AM [facility]    Discharge Medications:   Current Discharge Medication List     START taking these medications      Details  diphenhydrAMINE 50 mg/mL injection Commonly known as: BENADRYL  Inject 0.5 mLs (25 mg dose) into the vein at bedtime as needed for Sleep. Quantity: 0.5 mL   glycopyrrolate 0.2 mg/mL injection Commonly known as: ROBINUL  Inject 1 mL  (0.2 mg dose) into the vein every 4 (four) hours as needed (increased respiratory secretions). Quantity: 1 mL   HYDROmorphone 1 mg/mL injection Commonly known as: DILAUDID  Inject 0.5 mLs (0.5 mg dose) into the vein every hour as needed (if patient cannot take PO) for up to 3 days. Max Daily Amount: 12 mg Quantity: 10 mL   midazolam  2 mg/2 mL Soln injection Commonly known as: VERSED   Inject 1 mL (1 mg dose) into the vein every hour as needed for up to 3 days. Max Daily Amount: 24 mg Indication: Hospice care Quantity: 1 mL   prochlorperazine 5 mg/mL injection Commonly known as: COMPAZINE  Inject 2 mLs (10 mg dose) into the vein every 6 (six) hours as needed. Quantity: 2 mL       STOP taking these medications    amiodarone 200 mg tablet Commonly known as: PACERONE   calcium  citrate-vitamin D  315-200 mg-unit per tablet Commonly known as: CITRACAL+D   D3 2000 50 mcg (2000 UT) Caps capsule Generic drug: cholecalciferol   diltiazem HCl 180 mg 24 hr capsule Commonly known as: CARDIZEM CD   lidocaine -prilocaine cream Commonly known as: EMLA   ondansetron 8 mg tablet Commonly known as: ZOFRAN   prochlorperazine 5 MG tablet Commonly known as: COMPAZINE   rosuvastatin  calcium  10 mg tablet Commonly known as: CRESTOR    TIADYLT ER 180  MG 24 hr capsule Generic drug: diltiazem   vitamin B-12 500 mcg tablet Commonly known as: CYANOCOBALAMIN        Allergies: Allergies[1]  Consultations this Admission: IP CONSULT TO NUTRITION SERVICES IP CONSULT TO CASE MANAGEMENT, RN/SW IP CONSULT TO GENERAL SURGERY IP CONSULT TO PALLIATIVE CARE IP CONSULT TO CASE MANAGEMENT, RN/SW  Procedures/Imaging:     XR Chest Ap Portable  Final Result  IMPRESSION: Left basilar opacity and effusion with associated volume loss consistent with atelectasis. Superimposed pneumonia should be considered.    Stable right perihilar opacity.    Electronically Signed by: Grayce Linear, MD  on 04/13/2024 8:43 AM    CT Chest WO Contrast  Final Result  IMPRESSION:    1. Unchanged 2.9 cm right upper lobe mass with postobstructive right upper lobe atelectasis/pneumonitis. Small pulmonary nodules in the right lower lobe are slightly larger.  2. New consolidation of left lower lobe with adjacent small left pleural effusion.  3. Osteoblastic metastatic lesions involving T8, T12, right scapula, and right third rib.  4. Mild ascites in visualized upper abdomen.  5. Coronary artery calcification.       Electronically Signed by: Sonny Livings, MD on 04/12/2024 1:43 AM    FL Small Bowel Series  Final Result  IMPRESSION:  Redemonstration of multiple abnormally distended loops of small bowel measuring up to 7 cm in diameter, similar to prior study.          Electronically Signed by: Charlie Patch, MD on 04/11/2024 11:30 PM    XR Abdomen Ap  Final Result  IMPRESSION:  1. NG tube tip proximal stomach    Electronically Signed by: Glendia Guillaume, MD on 04/09/2024 7:15 PM    XR Abdomen Ap  Final Result  IMPRESSION:  Dilated small bowel to 5.7 cm is consistent with small bowel obstruction.    Electronically Signed by: Emeline JINNY Poisson, MD on 04/09/2024 6:51 PM    CT Chest Abdomen Pelvis WO IV Contrast  Final Result  IMPRESSION:   1. Interval development of markedly dilated small bowel segments with decompressed distal small bowel suggesting either high-grade obstruction or possible mesenteric ischemia.    2. Right upper lobe neoplasm and small lung nodules with evidence of metastatic disease as above.        ###CODE CRITICAL REPORT###    Automated notification pathway concerning the above report was activated at the time below.    ORDERING PROVIDER (Unless stated above may not be the provider contacted): CALEB LADSON LARRIMORE  TIME: 04/08/2024 12:13 PM  Please note that any pulmonary follow up recommendations are in accordance with the Fleischner Society 2017 guidelines,  unless otherwise stated.    Electronically Signed by: Lynwood Portugal on 04/08/2024 12:13 PM    XR Chest Ap Portable  Final Result  Impression: Right upper lobe malignancy and osseous metastatic disease.    Electronically Signed by: Lynwood Portugal on 04/08/2024 10:50 AM      Pertinent Labs:  Cardiac Labs: No results for input(s): CK, CKMB, CTNI, BNP in the last 168 hours. CBC: Recent Labs    Units 04/13/24 0839 04/12/24 0312 04/11/24 0109 04/10/24 0129 04/09/24 0206 04/08/24 2012 04/08/24 1011  WBC thou/mcL 7.0 6.8 6.9 5.1 4.6 3.3* 2.9*  HGB gm/dL 8.2* 8.0* 8.1* 9.2* 8.8* 9.3* 7.8*  PLT thou/mcL 86* 94* 117* 188 231 223 219   BMP: Recent Labs    Units 04/13/24 9160 04/13/24 0324 04/12/24 9687 04/11/24 0109 04/10/24 0129 04/09/24 0206 04/08/24 2012 04/08/24 1011  04/07/24 1012  NA mmol/L  --  151* 150* 145 141 136 135* 133* 136  K mmol/L  --  5.2 5.0 5.2 4.5 3.8 3.6* 2.4* 3.0*  CL mmol/L  --  110* 109* 107 102 99 97 95* 96  CO2 mmol/L  --  22 22 18* 18* 22 23 24 20   BUN mg/dL  --  86* 86* 75* 63* 46* 43* 41* 36*  CREATININE mg/dL  --  5.50* 5.62* 6.39* 2.91* 1.85* 1.58* 1.51* 1.44*  MAGNESIUM mg/dL 2.1  --   --  2.3  --   --  2.5 1.8 1.6   Lipid Panel: No results for input(s): CHOL, TRIG, HDL, LDL in the last 168 hours. Liver Enzymes: Recent Labs    Units 04/13/24 0324 04/08/24 2012 04/08/24 1011 04/07/24 1012  AST U/L 9 13 28  241*  ALT U/L 26 151* 195* 333*  ALKPHOS U/L 83 113 125 106  BILITOT mg/dL 0.2 0.8 0.5 0.4   Endocrine Panels: Recent Labs    Units 04/13/24 0324 04/12/24 0312 04/11/24 0109 04/10/24 0730 04/10/24 0129 04/09/24 0206 04/08/24 2012 04/08/24 1011 04/07/24 1012  TSH mcIU/mL  --   --   --   --   --   --   --  2.63  --   GLUCOSE mg/dL 96 899* 896* 94 888* 883* 123* 121* 106[facility] Course   Physicians involved in care during this hospitalization Attending Provider: Balinda GORMAN Short, DO Attending  Provider: Classie Leek, MD Admitting Provider: Balinda GORMAN Short, DO Consulting Physician: Christell VEAR Mater, MD Consulting Physician: [facility] Consulting Physician: Albino KANDICE Lawyer, MD Consulting Physician: Classie Leek, MD  HPI per admitting provider: 78 y.o. White or Caucasian [1] female with PMHx of small cell lung cancer, atrial fibrillation not anticoagulated secondary to severe anemia who presented to [facility] with complaints of generalized weakness, nausea, vomiting, diarrhea for the last 2 to 3 weeks. States she has been unable to keep anything down by mouth secondary to her symptoms. She attributes this to her chemotherapy. States that she is having 3-5 loose stools per day. No recent travel or sick contacts. Says she has a new diagnosis of atrial fibrillation and is on amiodarone and Cardizem. She saw her cardiologist Dr. Lilian 1 day ago. Currently she endorses generalized abdominal discomfort. Chronic cough. Denies any fevers or chills. No melena, hematochezia, hematemesis. Has been taking her home medications as prescribed. Denies any chest pain, shortness of breath or palpitations.       Hospital Course:    78 year old female with history of metastatic small cell lung cancer, atrial fibrillation not anticoagulated due to severe anemia, hyperlipidemia among other diagnoses who presented with generalized weakness, nausea vomiting and diarrhea.  She had been having nausea vomiting and diarrhea for 3 weeks prior to presentation.   She was admitted to medicine service with electrolyte derangement including hypokalemia, hypocalcemia and hypophosphatemia.  She was noted to be in atrial fibrillation with rapid ventricular response with known prior history of atrial fibrillation.  She was placed on IV fluid and electrolyte replacement regimen and electrolytes were monitored daily. She subsequently developed abdominal symptoms and imaging studies done revealed findings  consistent with small bowel obstruction.  Consultations: General surgery: She was seen by general surgery on this admission.  NG tube was placed.  She had NG tube in place for GI decompression and was kept on IV fluid and electrolyte replacement regimen.  She continued to have significant output from the NG tube. She is not a candidate for surgery.    Palliative care service: Seen by palliative care service: Extensive discussions with family concerning overall goals of care and underlying diagnosis including metastatic cancer.  With her declining condition patient and family elected to advance to comfort measures only and transition to hospice at a facility.   Recommendations: Discharge to hospice at facility    Discharge diagnoses: Metastatic lung cancer Acute metabolic encephalopathy Acute small bowel obstruction Atrial fibrillation with RVR Acute kidney injury stage I Nausea vomiting and diarrhea Acute abdominal pain Hypokalemia Hypophosphatemia Hypocalcemia Right upper breast mass Other chronic medical conditions: Hypertension, hyperlipidemia      BP (!) 117/95   Pulse (!) 131   Temp 96.8 F (36 C) (Axillary)   Resp (!) 29   Ht 1.62 m (5' 3.78)   Wt 63.6 kg (140 lb 3.4 oz)   SpO2 95%   BMI 24.23 kg/m   Physical Exam Constitutional:      Appearance: She is ill-appearing.  HENT:     Head: Normocephalic and atraumatic.  Cardiovascular:     Rate and Rhythm: Regular rhythm. Tachycardia present.     Heart sounds:     No friction rub. No gallop.  Pulmonary:     Breath sounds: No stridor. No rhonchi.  Abdominal:     General: There is distension.     Tenderness: There is no guarding.  Neurological:     Comments: [facility]   Activity:   Weight Bearing Status:          Oxygen Orders for Discharge: O2 Device: High flow nasal cannula SpO2: 95 % O2 Flow Rate (L/min): 4 L/min  Diet: Diet and Nourishment Orders (From  admission, onward)     Start       04/13/24 1430  Clear liquid diet Thin 0; Meal Supervision: Aspiration  Diet effective now       Question Answer Comment  Fluid consistency: Thin 0   Meal Supervision Meal Supervision: Aspiration                 Wound Care Recommendations:    Lines/Drains/Airways: Patient Lines/Drains/Airways Status     Active LDAs     Name Placement date Placement time Site Days   Implanted Port 03/10/24 Powered Chest 03/10/24  1159  Chest  34   Peripheral IV 22 G Left;Posterior Forearm 04/10/24  1005  Forearm  3   NG/OG Tube Nasogastric 14 Fr. Right nostril 04/09/24  1815  Right nostril  3   Urethral Catheter 04/10/24  1001  --  3            Therapy Recommendations:  PT:                OT:          SLP: Anticipated SLP Needs at Discharge: No SLP needs anticipated Anticipated Caregiver Needs at Discharge: Not formally addressed   Nutritional Recommendation: Ice Chips OK, Thin Liquids/0 (single ice chips; thins by tsp - both for comfort) Recommended Form of Meds: By non-oral means Compensatory Swallowing Strategies: Upright as possible for oral intake, Small, single bites/sips, Eat/feed slowly, Feed only when fully awake/alert, No straws, Liquids by spoon only, One to one assist with feeding, Oral care prior to ice/water  Home Health Orders: DME Orders (From admission, onward)    None      Home Health Agency  None       I spent 32 minutes performing discharge services.   Electronically signed: Classie Leek, MD 04/13/2024 / 4:38 PM      [1] Allergies Allergen Reactions  . Zoloft  Chest Pain    Chest pain  . Codeine Unknown  . Hydrochlorothiazide  Other  . Latex Unknown  . Nitrofurantoin  Other    Chest Pain  . Sertraline  Hcl Other    Chest pain  . Sulfamethoxazole -Trimethoprim  Other    Thrush

## 2024-04-17 DEATH — deceased

## 2024-04-25 ENCOUNTER — Ambulatory Visit (INDEPENDENT_AMBULATORY_CARE_PROVIDER_SITE_OTHER): Admitting: Family Medicine

## 2024-04-25 DIAGNOSIS — I10 Essential (primary) hypertension: Secondary | ICD-10-CM

## 2024-04-25 NOTE — Progress Notes (Unsigned)
   Established Patient Office Visit  Subjective  Patient ID: Michelle Lawrence, female    DOB: 1946/05/12  Age: 78 y.o. MRN: [id number]  No chief complaint on file.   HPI Passed away on hsopice    ROS    Objective:     There were no vitals taken for this visit.   Physical Exam   No results found for any visits on 04/25/24.    The 10-year ASCVD risk score (Arnett DK, et al., 2019) is: 23.6%    Assessment & Plan:   Problem List Items Addressed This Visit       Cardiovascular and Mediastinum   Essential hypertension, benign - Primary    No follow-ups on file.    Dorothyann Byars, MD

## 2024-05-11 ENCOUNTER — Other Ambulatory Visit: Payer: Self-pay | Admitting: Family Medicine

## 2024-05-11 DIAGNOSIS — I10 Essential (primary) hypertension: Secondary | ICD-10-CM
